# Patient Record
Sex: Male | Born: 1990
Health system: Southern US, Community
[De-identification: ages and names within clinical notes are randomized; demographics above are authoritative.]

## PROBLEM LIST (undated history)

## (undated) DIAGNOSIS — A3790 Whooping cough, unspecified species without pneumonia: Secondary | ICD-10-CM

## (undated) DIAGNOSIS — K259 Gastric ulcer, unspecified as acute or chronic, without hemorrhage or perforation: Secondary | ICD-10-CM

## (undated) DIAGNOSIS — F909 Attention-deficit hyperactivity disorder, unspecified type: Secondary | ICD-10-CM

---
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
## 2009-07-25 ENCOUNTER — Emergency Department (HOSPITAL_COMMUNITY): Admission: EM | Admit: 2009-07-25 | Discharge: 2009-07-25 | Payer: Self-pay | Admitting: Family Medicine

## 2009-08-08 ENCOUNTER — Emergency Department (HOSPITAL_COMMUNITY): Admission: EM | Admit: 2009-08-08 | Discharge: 2009-08-08 | Payer: Self-pay | Admitting: Emergency Medicine

## 2010-04-14 LAB — BASIC METABOLIC PANEL
CO2: 27 mEq/L (ref 19–32)
Chloride: 108 mEq/L (ref 96–112)
Creatinine, Ser: 1.04 mg/dL (ref 0.4–1.5)
GFR calc Af Amer: >60 mL/min (ref >60)
GFR calc non Af Amer: >60 mL/min (ref >60)
Sodium: 143 mEq/L (ref 135–145)

## 2010-04-14 LAB — DIFFERENTIAL
Basophils Absolute: 0.1 10*3/uL (ref 0.0–0.1)
Basophils Relative: 1 % (ref 0–1)
Eosinophils Absolute: 0 10*3/uL (ref 0.0–0.7)
Eosinophils Relative: 0 % (ref 0–5)
Lymphocytes Relative: 9 % — ABNORMAL LOW (ref 12–46)
Lymphs Abs: 1.5 10*3/uL (ref 0.7–4.0)

## 2010-04-14 LAB — CBC
HCT: 44.4 % (ref 39.0–52.0)
MCV: 91 fL (ref 78.0–100.0)
RBC: 4.88 MIL/uL (ref 4.22–5.81)
RDW: 13.5 % (ref 11.5–15.5)

## 2010-04-14 LAB — POCT RAPID STREP A (OFFICE): Streptococcus, Group A Screen (Direct): NEGATIVE

## 2010-04-14 LAB — POCT INFECTIOUS MONO SCREEN: Mono Screen: NEGATIVE

## 2010-04-14 LAB — BRAIN NATRIURETIC PEPTIDE: Pro B Natriuretic peptide (BNP): 43 pg/mL (ref 0.0–100.0)

## 2010-06-11 NOTE — Consult Note (Signed)
[name], [name]              ACCOUNT NO.:  [id number]   MEDICAL RECORD NO.:  [id number]          PATIENT TYPE:  EMS   LOCATION:  MAJO                         FACILITY:  [facility]   PHYSICIAN:  Kinnie Scales. Annalee Genta, M.D.DATE OF BIRTH:  February 23, 1990   DATE OF CONSULTATION:  11/13/2006  DATE OF DISCHARGE:  11/14/2006                                 CONSULTATION   DIAGNOSIS:  Mandibular trauma with mandibular dislocation.   BRIEF HISTORY AND PHYSICAL:  The patient is a 20 year old white male who  was transferred to [facility] Emergency Department after  suffering injury while playing football.  The patient was struck in the  face and suffered a mandibular dislocation with significant right TMJ  trauma.  He had trismus with an open bite deformity and was unable to  close his jaw.  He was evaluated at the football game and his medical  trainer was able to reduce his mandibular dislocation.  He presents to  the [facility] Emergency Department for additional evaluation and  workup.  Imaging studies including Panorex and CT scan were performed.  There was no evidence of mandibular or maxillary fracture.  The temporal  mandibular fossa appeared to be intact and the patient had appropriate  TMJ height without evidence of dislocation.  The patient complained of  significant trismus and malocclusion and was evaluated in the emergency  department.  No loss of consciousness, no bleeding.  No prior history of  mandible trauma.  He complained of right temporal headache and right-  sided otalgia.   PHYSICAL EXAMINATION:  [name] is a healthy 20 year old white male, alert  and oriented to self, place, and time in no acute distress.  HEENT:  Ears show normal external auditory canals.  Tympanic membranes:  No evidence of ear canal laceration or hematoma.  Normal tympanic  membrane.  Nasal cavity shows mildly deviated septum.  No bleeding or  discharge.  Oral cavity/oropharynx:  Normal dentition.   The patient had  normal occlusion.  No evidence of TMJ crepitance.  Minimal pain.  No  lateral excursion or evidence of TMJ dislocation.  NECK:  No lymphadenopathy or mass.   IMPRESSION:  Mandibular trauma with right mandibular dislocation,  resolved.   ASSESSMENT/PLAN:  The patient presents for evaluation of mandibular  trauma and dislocation.  The TMJ is reduced.  There is no evidence of  significant dislocation at this time.  I have recommended a soft diet  avoiding any further trauma.  The patient will start Motrin 600 mg p.o.  t.i.d. for 10 days, use a warm compress over the right jaw joint, and  will return to see me in two weeks for recheck or sooner as warranted.  The patient's mother was comfortable with this plan and will follow up  as above.           ______________________________  Kinnie Scales. Annalee Genta, M.D.     DLS/MEDQ  D:  16/10/9602  T:  11/15/2006  Job:  [id number]
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
## 2014-05-11 ENCOUNTER — Ambulatory Visit (INDEPENDENT_AMBULATORY_CARE_PROVIDER_SITE_OTHER): Payer: Worker's Compensation | Admitting: Family Medicine

## 2014-05-11 VITALS — BP 122/74 | HR 71 | Temp 98.1°F | Resp 18

## 2014-05-11 DIAGNOSIS — S0511XA Contusion of eyeball and orbital tissues, right eye, initial encounter: Secondary | ICD-10-CM

## 2014-05-11 DIAGNOSIS — H53131 Sudden visual loss, right eye: Secondary | ICD-10-CM

## 2014-05-11 DIAGNOSIS — S0501XA Injury of conjunctiva and corneal abrasion without foreign body, right eye, initial encounter: Secondary | ICD-10-CM

## 2014-05-11 DIAGNOSIS — S058X1A Other injuries of right eye and orbit, initial encounter: Secondary | ICD-10-CM | POA: Diagnosis not present

## 2014-05-11 DIAGNOSIS — S0591XA Unspecified injury of right eye and orbit, initial encounter: Secondary | ICD-10-CM

## 2014-05-11 NOTE — Progress Notes (Signed)
Patient had moved his head down and stuck his right eye onto a metal rod that had a hip about 0.5 x 1.5 cm in size. This just happened. He cannot see through that. Acutely he was examined. There is a little tear of the sclera and contusion of the upper part of the iris and conjunctiva from about the line connecting 1:00 and 11:00. His vision can only see vague distinction between 2 and 3 fingers in the right eye. Left eye vision is excellent. He is having a great deal of pain. Spoke to Dr. Alden HippGrote who said to send him immediately over. We did so. I did not feel like it was wise to take time to do the [employer] drug screen. I explained this to his boss. This was to urgent an emergency.

## 2014-05-11 NOTE — Patient Instructions (Signed)
-   Please report to Dr. Dione BoozeGroat immediately for emergency eye evaluation.

## 2014-05-11 NOTE — Progress Notes (Signed)
    MRN: [id number] DOB: 10-27-1990  Subjective:   [name] is a 24 y.o. male presenting for chief complaint of Foreign Body in Eye  Reports blunt trauma to his right eye while at work today. Patient was bending over a stand, a clamp came loose and shot into his right eye. Patient felt immediate pain, has been tearing and unable to open his eye. Has not tried any medications for relief. Denies drug use. Denies any other aggravating or relieving factors, no other questions or concerns.  [name] currently has no medications in their medication list. He has No Known Allergies.  [name]  has no past medical history on file. Also  has no past surgical history on file.  ROS As in subjective.  Objective:   Vitals: BP 122/74 mmHg  Pulse 71  Temp(Src) 98.1 F (36.7 C) (Oral)  Resp 18  SpO2 97%  Physical Exam  Constitutional: He is oriented to person, place, and time and well-developed, well-nourished, and in no distress.  Eyes: Pupils are equal, round, and reactive to light. Right conjunctiva is injected. Right conjunctiva has no hemorrhage. Right eye exhibits abnormal extraocular motion (decreased eye movements superiorly, inferiorly and laterally). Right eye exhibits no nystagmus.    Patient only able to see 2 fingers at ~3 feet.  Neurological: He is alert and oriented to person, place, and time.  Skin: Skin is warm and dry. There is erythema (slight eyelid erythema upper>lower in right eye).   Assessment and Plan :   1. Eye injury, right, initial encounter 2. Corneal abrasion, right, initial encounter 3. Contusion of right eye, initial encounter 4. Acute loss of vision, right - Spoke with Dr. Dione BoozeGroat regarding patient's case, advised that he report to clinic immediately for further evaluation of retinal detachment, penetrating globe injury. - Patient was sent immediately, provided with ibuprofen and proparacaine eye drops for pain relief.  Wallis BambergMario Kortne All, PA-C [facility] [phone] 05/11/2014 11:01 AM

## 2014-05-15 ENCOUNTER — Emergency Department (HOSPITAL_COMMUNITY)
Admission: EM | Admit: 2014-05-15 | Discharge: 2014-05-16 | Disposition: A | Discharge status: Home / Self Care | Payer: Worker's Compensation | Attending: Emergency Medicine | Admitting: Emergency Medicine

## 2014-05-15 ENCOUNTER — Encounter (HOSPITAL_COMMUNITY): Payer: Self-pay | Admitting: Emergency Medicine

## 2014-05-15 DIAGNOSIS — Z720 Tobacco use: Secondary | ICD-10-CM | POA: Insufficient documentation

## 2014-05-15 DIAGNOSIS — R51 Headache: Secondary | ICD-10-CM | POA: Diagnosis not present

## 2014-05-15 DIAGNOSIS — S0591XD Unspecified injury of right eye and orbit, subsequent encounter: Secondary | ICD-10-CM | POA: Insufficient documentation

## 2014-05-15 DIAGNOSIS — W228XXD Striking against or struck by other objects, subsequent encounter: Secondary | ICD-10-CM | POA: Diagnosis not present

## 2014-05-15 DIAGNOSIS — R110 Nausea: Secondary | ICD-10-CM | POA: Insufficient documentation

## 2014-05-15 DIAGNOSIS — H538 Other visual disturbances: Secondary | ICD-10-CM | POA: Insufficient documentation

## 2014-05-15 DIAGNOSIS — R519 Headache, unspecified: Secondary | ICD-10-CM

## 2014-05-15 LAB — BASIC METABOLIC PANEL
Anion gap: 11 (ref 5–15)
BUN: 14 mg/dL (ref 6–23)
CO2: 23 mmol/L (ref 19–32)
Calcium: 9.6 mg/dL (ref 8.4–10.5)
Chloride: 104 mmol/L (ref 96–112)
Creatinine, Ser: 0.84 mg/dL (ref 0.50–1.35)
GFR calc Af Amer: >90 mL/min (ref >90)
GFR calc non Af Amer: >90 mL/min (ref >90)
Glucose, Bld: 86 mg/dL (ref 70–99)
Potassium: 3.9 mmol/L (ref 3.5–5.1)
Sodium: 138 mmol/L (ref 135–145)

## 2014-05-15 LAB — CBC
HCT: 46.7 % (ref 39.0–52.0)
Hemoglobin: 16.2 g/dL (ref 13.0–17.0)
MCH: 31.3 pg (ref 26.0–34.0)
MCHC: 34.7 g/dL (ref 30.0–36.0)
MCV: 90.3 fL (ref 78.0–100.0)
Platelets: 242 10*3/uL (ref 150–400)
RBC: 5.17 MIL/uL (ref 4.22–5.81)
RDW: 13.3 % (ref 11.5–15.5)
WBC: 12.1 10*3/uL — ABNORMAL HIGH (ref 4.0–10.5)

## 2014-05-15 MED ORDER — FLUORESCEIN SODIUM 1 MG OP STRP
1.0000 | ORAL_STRIP | Freq: Once | OPHTHALMIC | Status: DC
  Filled 2014-05-15: qty 1

## 2014-05-15 MED ORDER — TETRACAINE HCL 0.5 % OP SOLN
2.0000 [drp] | Freq: Once | OPHTHALMIC | Status: DC
  Filled 2014-05-15: qty 2

## 2014-05-15 MED ORDER — ONDANSETRON HCL 4 MG/2ML IJ SOLN
4.0000 mg | Freq: Once | INTRAMUSCULAR | Status: AC
  Administered 2014-05-15: 4 mg via INTRAVENOUS
  Filled 2014-05-15: qty 2

## 2014-05-15 MED ORDER — MORPHINE SULFATE 4 MG/ML IJ SOLN
4.0000 mg | Freq: Once | INTRAMUSCULAR | Status: AC
  Administered 2014-05-15: 4 mg via INTRAVENOUS
  Filled 2014-05-15: qty 1

## 2014-05-15 NOTE — ED Provider Notes (Signed)
CSN: [id number]     Arrival date & time 05/15/14  2050 History  This chart is scribed for non-physician practitioner, Junius FinnerErin O'Malley, PA-C, working with Arby BarretteMarcy Pfeiffer, MD by Abel PrestoKara Demonbreun, ED Scribe.  This patient was seen in room TR04C/TR04C and the patient's care was started 10:10 PM.     Chief Complaint  Patient presents with  . Eye Injury    Patient is a 24 y.o. male presenting with eye injury. The history is provided by the patient. No language interpreter was used.  Eye Injury Associated symptoms include headaches.   HPI Comments: Webb SilversmithJoshua Allocca is a 24 y.o. male who presents to the Emergency Department complaining of right eye injury with onset 11 days ago. Pt hit head on metal clamp and rod lacerated his eye. Pt states eye was scratched and damaged done to skull, but notes he was treated for the injury at that time. Pt notes 8-9/10 pain with a frontal headache and blurred vision. However, pt's opthamologist, Dr. Dione BoozeGroat has concern for optic nerve damage, infection, or fracture since pt's vision has not been recovering as it should. Pt was to have a CT scan done through worker's comp but pt reports they would not approve the scan. Pt here for evaluation.  He reports waxing and waning headache with onset yesterday and nausea since incident. Pt has NKDA. Pt denies of fever and vomiting. Pt does not have a PCP.   History reviewed. No pertinent past medical history. History reviewed. No pertinent past surgical history. No family history on file. History  Substance Use Topics  . Smoking status: Current Every Day Smoker  . Smokeless tobacco: Not on file  . Alcohol Use: No    Review of Systems  Constitutional: Negative for fever.  Eyes: Positive for pain, redness and visual disturbance. Negative for discharge and itching.  Gastrointestinal: Positive for nausea. Negative for vomiting.  Neurological: Positive for headaches.  All other systems reviewed and are negative.     Allergies   Review of patient's allergies indicates no known allergies.  Home Medications   Prior to Admission medications   Medication Sig Start Date End Date Taking? Authorizing Provider  HYDROcodone-acetaminophen (NORCO/VICODIN) 5-325 MG per tablet Take 1-2 tablets by mouth every 4 (four) hours as needed. 05/16/14   Junius FinnerErin O'Malley, PA-C   BP 121/68 mmHg  Pulse 70  Temp(Src) 98 F (36.7 C) (Oral)  Resp 18  Ht 6\' 3"  (1.905 m)  Wt 275 lb (124.739 kg)  BMI 34.37 kg/m2  SpO2 97% Physical Exam  Constitutional: He is oriented to person, place, and time. He appears well-developed and well-nourished.  HENT:  Head: Normocephalic.  No obvious deformity of right orbital socket; tenderness to superior medial aspect of right orbit; inferior lateral aspect of right orbit  Eyes: EOM are normal. Pupils are equal, round, and reactive to light. Foreign body (medicinal/protective lens in place) present in the right eye. Right conjunctiva has a hemorrhage.    Neck: Normal range of motion.  Cardiovascular: Normal rate.   Pulmonary/Chest: Effort normal.  Musculoskeletal: Normal range of motion.  Neurological: He is alert and oriented to person, place, and time.  Skin: Skin is warm and dry.  Psychiatric: He has a normal mood and affect. His behavior is normal.  Nursing note and vitals reviewed.   ED Course  Procedures (including critical care time) DIAGNOSTIC STUDIES: Oxygen Saturation is 97% on room air, normal by my interpretation.    COORDINATION OF CARE: 10:16 PM Discussed treatment plan  with patient at beside, the patient agrees with the plan and has no further questions at this time.  Consulted directly with Dr. Dione Booze who states he is concerned pt may have a fracture, which could lead pt to be susceptible to infection due to nature of pt's injury. Pt was seen initially by Dr. Dione Booze immediately after incident. Dr. Dione Booze reports the external eye is healing well and even has a lens in place to aid in  healing, however, requests a CT scan.    1:05 AM  Pt states he received a phone text by Dr. Dione Booze, ophthalmology, who personally viewed pt's CT scan.  Dr. Dione Booze states no fracture visible on CT and optic nerve appears to be in tact. Pt may be discharged to f/u with Dr. Dione Booze next week.  Pt states he feels comfortable with this result and feels comfortable being discharged home.    Labs Review Labs Reviewed  CBC - Abnormal; Notable for the following:    WBC 12.1 (*)    All other components within normal limits  BASIC METABOLIC PANEL    Imaging Review No results found.   EKG Interpretation None      MDM   Final diagnoses:  Right eye injury, subsequent encounter  Frontal headache   Pt is a 23yo male sent to ED for an urgent head CT per request of Dr. Dione Booze, ophthalmologist.  Pt was seen initially by Dr. Dione Booze for work's comp related injury.  CT did not officially result while pt in ED.  Pt did receive at text message from Dr. Dione Booze and insisted he felt confident with the report given by his ophthalmologist and requested to be discharged.  Pt safe for discharge home. Pt to f/u with Dr. Dione Booze next week as planned   I personally performed the services described in this documentation, which was scribed in my presence. The recorded information has been reviewed and is accurate.     Junius Finner, PA-C 05/16/14 0148  Loren Racer, MD 05/16/14 [phone]

## 2014-05-15 NOTE — ED Notes (Signed)
Pt. reports injury to right eye last Thursday ( hit with a metal rod ) with pain and blurred vision , advised by his eye MD to go to ER for further evaluation .

## 2014-05-16 ENCOUNTER — Emergency Department (HOSPITAL_COMMUNITY): Payer: Worker's Compensation

## 2014-05-16 ENCOUNTER — Encounter (HOSPITAL_COMMUNITY): Payer: Self-pay

## 2014-05-16 MED ORDER — IOHEXOL 300 MG/ML  SOLN
75.0000 mL | Freq: Once | INTRAMUSCULAR | Status: AC | PRN
  Administered 2014-05-16: 75 mL via INTRAVENOUS

## 2014-05-16 MED ORDER — HYDROCODONE-ACETAMINOPHEN 5-325 MG PO TABS: 1.0000 | ORAL_TABLET | ORAL | Status: DC | PRN

## 2014-05-16 NOTE — Discharge Instructions (Signed)
Blunt Trauma °You have been evaluated for injuries. You have been examined and your caregiver has not found injuries serious enough to require hospitalization. °It is common to have multiple bruises and sore muscles following an accident. These tend to feel worse for the first 24 hours. You will feel more stiffness and soreness over the next several hours and worse when you wake up the first morning after your accident. After this point, you should begin to improve with each passing day. The amount of improvement depends on the amount of damage done in the accident. °Following your accident, if some part of your body does not work as it should, or if the pain in any area continues to increase, you should return to the Emergency Department for re-evaluation.  °HOME CARE INSTRUCTIONS  °Routine care for sore areas should include: °· Ice to sore areas every 2 hours for 20 minutes while awake for the next 2 days. °· Drink extra fluids (not alcohol). °· Take a hot or warm shower or bath once or twice a day to increase blood flow to sore muscles. This will help you "limber up". °· Activity as tolerated. Lifting may aggravate neck or back pain. °· Only take over-the-counter or prescription medicines for pain, discomfort, or fever as directed by your caregiver. Do not use aspirin. This may increase bruising or increase bleeding if there are small areas where this is happening. °SEEK IMMEDIATE MEDICAL CARE IF: °· Numbness, tingling, weakness, or problem with the use of your arms or legs. °· A severe headache is not relieved with medications. °· There is a change in bowel or bladder control. °· Increasing pain in any areas of the body. °· Short of breath or dizzy. °· Nauseated, vomiting, or sweating. °· Increasing belly (abdominal) discomfort. °· Blood in urine, stool, or vomiting blood. °· Pain in either shoulder in an area where a shoulder strap would be. °· Feelings of lightheadedness or if you have a fainting  episode. °Sometimes it is not possible to identify all injuries immediately after the trauma. It is important that you continue to monitor your condition after the emergency department visit. If you feel you are not improving, or improving more slowly than should be expected, call your physician. If you feel your symptoms (problems) are worsening, return to the Emergency Department immediately. °Document Released: 10/09/2000 Document Revised: 04/07/2011 Document Reviewed: 09/01/2007 °Exit[employer]® Patient Information ©2015 [employer]. This information is not intended to replace advice given to you by your health care provider. Make sure you discuss any questions you have with your health care provider. ° °

## 2015-08-08 ENCOUNTER — Encounter (HOSPITAL_COMMUNITY): Payer: Self-pay | Admitting: Emergency Medicine

## 2015-08-08 ENCOUNTER — Ambulatory Visit (HOSPITAL_COMMUNITY)
Admission: EM | Admit: 2015-08-08 | Discharge: 2015-08-08 | Disposition: A | Discharge status: Home / Self Care | Payer: 59 | Attending: Family Medicine | Admitting: Family Medicine

## 2015-08-08 DIAGNOSIS — S39012A Strain of muscle, fascia and tendon of lower back, initial encounter: Secondary | ICD-10-CM | POA: Diagnosis not present

## 2015-08-08 MED ORDER — CYCLOBENZAPRINE HCL 5 MG PO TABS: 5.0000 mg | ORAL_TABLET | Freq: Three times a day (TID) | ORAL | Status: DC

## 2015-08-08 MED ORDER — DICLOFENAC POTASSIUM 50 MG PO TABS: 50.0000 mg | ORAL_TABLET | Freq: Three times a day (TID) | ORAL | Status: DC

## 2015-08-08 NOTE — Discharge Instructions (Signed)
See orthopedist if further problems °

## 2015-08-08 NOTE — ED Provider Notes (Signed)
CSN: [id number]     Arrival date & time 08/08/15  1233 History   First MD Initiated Contact with Patient 08/08/15 1305     Chief Complaint  Patient presents with  . Back Pain   (Consider location/radiation/quality/duration/timing/severity/associated sxs/prior Treatment) Patient is a 25 y.o. male presenting with back pain. The history is provided by the patient.  Back Pain Location:  Lumbar spine Quality:  Stabbing Radiates to:  R thigh Pain severity:  Mild Onset quality:  Gradual Duration:  2 weeks Progression:  Unchanged Chronicity:  Chronic Context: lifting heavy objects   Relieved by:  None tried Worsened by:  Nothing tried Ineffective treatments:  None tried Associated symptoms: leg pain   Associated symptoms: no abdominal pain, no bladder incontinence, no bowel incontinence, no fever, no numbness, no paresthesias and no pelvic pain     History reviewed. No pertinent past medical history. History reviewed. No pertinent past surgical history. Family History  Problem Relation Age of Onset  . Hypertension Mother    Social History  Substance Use Topics  . Smoking status: Current Every Day Smoker  . Smokeless tobacco: None  . Alcohol Use: No    Review of Systems  Constitutional: Negative.  Negative for fever.  Gastrointestinal: Negative.  Negative for abdominal pain and bowel incontinence.  Genitourinary: Negative for bladder incontinence and pelvic pain.  Musculoskeletal: Positive for back pain. Negative for joint swelling and gait problem.  Skin: Negative.   Neurological: Negative for numbness and paresthesias.  All other systems reviewed and are negative.   Allergies  Review of patient's allergies indicates no known allergies.  Home Medications   Prior to Admission medications   Medication Sig Start Date End Date Taking? Authorizing Provider  acetaminophen (TYLENOL) 325 MG tablet Take 650 mg by mouth every 6 (six) hours as needed.   Yes Historical Provider,  MD  cyclobenzaprine (FLEXERIL) 5 MG tablet Take 1 tablet (5 mg total) by mouth 3 (three) times daily. For muscle relaxer. 08/08/15   Linna HoffJames D Maebelle Sulton, MD  diclofenac (CATAFLAM) 50 MG tablet Take 1 tablet (50 mg total) by mouth 3 (three) times daily. 08/08/15   Linna HoffJames D Alekzander Cardell, MD  HYDROcodone-acetaminophen (NORCO/VICODIN) 5-325 MG per tablet Take 1-2 tablets by mouth every 4 (four) hours as needed. 05/16/14   Junius FinnerErin O'Malley, PA-C   Meds Ordered and Administered this Visit  Medications - No data to display  BP 121/60 mmHg  Pulse 58  Temp(Src) 98.8 F (37.1 C) (Oral)  Resp 18  SpO2 98% No data found.   Physical Exam  Constitutional: He is oriented to person, place, and time. He appears well-developed and well-nourished. No distress.  Abdominal: Soft. Bowel sounds are normal.  Musculoskeletal: He exhibits tenderness.       Lumbar back: He exhibits decreased range of motion, tenderness, pain and spasm. He exhibits no swelling and normal pulse.       Back:  Neurological: He is alert and oriented to person, place, and time.  Skin: Skin is warm and dry.  Nursing note and vitals reviewed.   ED Course  Procedures (including critical care time)  Labs Review Labs Reviewed - No data to display  Imaging Review No results found.   Visual Acuity Review  Right Eye Distance:   Left Eye Distance:   Bilateral Distance:    Right Eye Near:   Left Eye Near:    Bilateral Near:         MDM   1. Low back strain,  initial encounter        Linna Hoff, MD 08/08/15 1339

## 2015-08-08 NOTE — ED Notes (Signed)
Lower back pain that has been occuring intermittently for 4 years.   Patient reports pain into right leg.

## 2016-03-27 DIAGNOSIS — J019 Acute sinusitis, unspecified: Secondary | ICD-10-CM | POA: Diagnosis not present

## 2016-09-30 DIAGNOSIS — R42 Dizziness and giddiness: Secondary | ICD-10-CM | POA: Diagnosis not present

## 2016-09-30 DIAGNOSIS — I10 Essential (primary) hypertension: Secondary | ICD-10-CM | POA: Diagnosis not present

## 2016-10-13 ENCOUNTER — Encounter (HOSPITAL_COMMUNITY): Payer: Self-pay

## 2016-10-20 DIAGNOSIS — D72829 Elevated white blood cell count, unspecified: Secondary | ICD-10-CM | POA: Diagnosis not present

## 2016-10-20 DIAGNOSIS — R945 Abnormal results of liver function studies: Secondary | ICD-10-CM | POA: Diagnosis not present

## 2016-11-14 DIAGNOSIS — M25561 Pain in right knee: Secondary | ICD-10-CM | POA: Diagnosis not present

## 2016-11-14 DIAGNOSIS — Z23 Encounter for immunization: Secondary | ICD-10-CM | POA: Diagnosis not present

## 2017-05-01 DIAGNOSIS — R030 Elevated blood-pressure reading, without diagnosis of hypertension: Secondary | ICD-10-CM | POA: Diagnosis not present

## 2017-05-01 DIAGNOSIS — K644 Residual hemorrhoidal skin tags: Secondary | ICD-10-CM | POA: Diagnosis not present

## 2017-05-01 DIAGNOSIS — R21 Rash and other nonspecific skin eruption: Secondary | ICD-10-CM | POA: Diagnosis not present

## 2017-09-13 DIAGNOSIS — J209 Acute bronchitis, unspecified: Secondary | ICD-10-CM | POA: Diagnosis not present

## 2017-09-13 DIAGNOSIS — J019 Acute sinusitis, unspecified: Secondary | ICD-10-CM | POA: Diagnosis not present

## 2017-09-18 ENCOUNTER — Other Ambulatory Visit: Payer: Self-pay | Admitting: Family Medicine

## 2017-09-18 ENCOUNTER — Ambulatory Visit
Admission: RE | Admit: 2017-09-18 | Discharge: 2017-09-18 | Disposition: A | Discharge status: Home / Self Care | Payer: BLUE CROSS/BLUE SHIELD | Source: Ambulatory Visit | Attending: Family Medicine | Admitting: Family Medicine

## 2017-09-18 DIAGNOSIS — R05 Cough: Secondary | ICD-10-CM | POA: Diagnosis not present

## 2017-09-18 DIAGNOSIS — J209 Acute bronchitis, unspecified: Secondary | ICD-10-CM | POA: Diagnosis not present

## 2017-09-18 DIAGNOSIS — J40 Bronchitis, not specified as acute or chronic: Secondary | ICD-10-CM

## 2017-09-18 DIAGNOSIS — R21 Rash and other nonspecific skin eruption: Secondary | ICD-10-CM | POA: Diagnosis not present

## 2017-10-01 DIAGNOSIS — R05 Cough: Secondary | ICD-10-CM | POA: Diagnosis not present

## 2017-10-16 DIAGNOSIS — A3790 Whooping cough, unspecified species without pneumonia: Secondary | ICD-10-CM | POA: Diagnosis not present

## 2017-10-25 ENCOUNTER — Emergency Department (HOSPITAL_COMMUNITY): Payer: BLUE CROSS/BLUE SHIELD

## 2017-10-25 ENCOUNTER — Encounter (HOSPITAL_COMMUNITY): Payer: Self-pay

## 2017-10-25 ENCOUNTER — Other Ambulatory Visit: Payer: Self-pay

## 2017-10-25 ENCOUNTER — Emergency Department (HOSPITAL_COMMUNITY)
Admission: EM | Admit: 2017-10-25 | Discharge: 2017-10-25 | Disposition: A | Discharge status: Home / Self Care | Payer: BLUE CROSS/BLUE SHIELD | Attending: Emergency Medicine | Admitting: Emergency Medicine

## 2017-10-25 DIAGNOSIS — R05 Cough: Secondary | ICD-10-CM | POA: Insufficient documentation

## 2017-10-25 DIAGNOSIS — Z87891 Personal history of nicotine dependence: Secondary | ICD-10-CM | POA: Diagnosis not present

## 2017-10-25 DIAGNOSIS — R1033 Periumbilical pain: Secondary | ICD-10-CM | POA: Diagnosis not present

## 2017-10-25 DIAGNOSIS — R109 Unspecified abdominal pain: Secondary | ICD-10-CM | POA: Diagnosis not present

## 2017-10-25 HISTORY — DX: Whooping cough, unspecified species without pneumonia: A37.90

## 2017-10-25 LAB — COMPREHENSIVE METABOLIC PANEL
ALBUMIN: 4.9 g/dL (ref 3.5–5.0)
ALT: 46 U/L — ABNORMAL HIGH (ref 0–44)
ANION GAP: 12 (ref 5–15)
AST: 26 U/L (ref 15–41)
Alkaline Phosphatase: 72 U/L (ref 38–126)
BUN: 13 mg/dL (ref 6–20)
CHLORIDE: 108 mmol/L (ref 98–111)
CO2: 23 mmol/L (ref 22–32)
Calcium: 10 mg/dL (ref 8.9–10.3)
Creatinine, Ser: 0.91 mg/dL (ref 0.61–1.24)
GFR calc Af Amer: >60 mL/min (ref >60)
GFR calc non Af Amer: >60 mL/min (ref >60)
GLUCOSE: 92 mg/dL (ref 70–99)
POTASSIUM: 4 mmol/L (ref 3.5–5.1)
SODIUM: 143 mmol/L (ref 135–145)
Total Bilirubin: 0.8 mg/dL (ref 0.3–1.2)
Total Protein: 8.3 g/dL — ABNORMAL HIGH (ref 6.5–8.1)

## 2017-10-25 LAB — CBC
HEMATOCRIT: 48 % (ref 39.0–52.0)
HEMOGLOBIN: 16.6 g/dL (ref 13.0–17.0)
MCH: 31.8 pg (ref 26.0–34.0)
MCHC: 34.6 g/dL (ref 30.0–36.0)
MCV: 92 fL (ref 78.0–100.0)
Platelets: 255 10*3/uL (ref 150–400)
RBC: 5.22 MIL/uL (ref 4.22–5.81)
RDW: 13 % (ref 11.5–15.5)
WBC: 11.4 10*3/uL — ABNORMAL HIGH (ref 4.0–10.5)

## 2017-10-25 LAB — URINALYSIS, ROUTINE W REFLEX MICROSCOPIC
Bilirubin Urine: NEGATIVE
GLUCOSE, UA: NEGATIVE mg/dL
HGB URINE DIPSTICK: NEGATIVE
Ketones, ur: NEGATIVE mg/dL
Leukocytes, UA: NEGATIVE
NITRITE: NEGATIVE
PH: 7 (ref 5.0–8.0)
Protein, ur: NEGATIVE mg/dL
SPECIFIC GRAVITY, URINE: 1.021 (ref 1.005–1.030)

## 2017-10-25 LAB — LIPASE, BLOOD: Lipase: 30 U/L (ref 11–51)

## 2017-10-25 MED ORDER — IOPAMIDOL (ISOVUE-300) INJECTION 61%
INTRAVENOUS | Status: AC
  Filled 2017-10-25: qty 100

## 2017-10-25 MED ORDER — HYDROCODONE-ACETAMINOPHEN 5-325 MG PO TABS
1.0000 | ORAL_TABLET | Freq: Once | ORAL | Status: AC
  Administered 2017-10-25: 1 via ORAL
  Filled 2017-10-25: qty 1

## 2017-10-25 MED ORDER — HYDROMORPHONE HCL 1 MG/ML IJ SOLN
0.5000 mg | Freq: Once | INTRAMUSCULAR | Status: AC
  Administered 2017-10-25: 0.5 mg via INTRAVENOUS
  Filled 2017-10-25: qty 1

## 2017-10-25 MED ORDER — ONDANSETRON HCL 4 MG/2ML IJ SOLN
4.0000 mg | Freq: Once | INTRAMUSCULAR | Status: AC
  Administered 2017-10-25: 4 mg via INTRAVENOUS
  Filled 2017-10-25: qty 2

## 2017-10-25 MED ORDER — NAPROXEN 500 MG PO TABS: 500.0000 mg | ORAL_TABLET | Freq: Two times a day (BID) | ORAL | 0 refills | Status: DC

## 2017-10-25 MED ORDER — HYDROCODONE-ACETAMINOPHEN 5-325 MG PO TABS: 1.0000 | ORAL_TABLET | Freq: Four times a day (QID) | ORAL | 0 refills | Status: DC | PRN

## 2017-10-25 MED ORDER — KETOROLAC TROMETHAMINE 15 MG/ML IJ SOLN
15.0000 mg | Freq: Once | INTRAMUSCULAR | Status: AC
  Administered 2017-10-25: 15 mg via INTRAVENOUS
  Filled 2017-10-25: qty 1

## 2017-10-25 MED ORDER — IOPAMIDOL (ISOVUE-300) INJECTION 61%
100.0000 mL | Freq: Once | INTRAVENOUS | Status: AC | PRN
  Administered 2017-10-25: 100 mL via INTRAVENOUS

## 2017-10-25 MED ORDER — SODIUM CHLORIDE 0.9 % IV BOLUS
500.0000 mL | Freq: Once | INTRAVENOUS | Status: AC
  Administered 2017-10-25: 500 mL via INTRAVENOUS

## 2017-10-25 NOTE — ED Provider Notes (Signed)
[facility]-EMERGENCY DEPT Provider Note   CSN: [id number] Arrival date & time: 10/25/17  1352     History   Chief Complaint Chief Complaint  Patient presents with  . Abdominal Pain    HPI [name] is a 27 y.o. male.  Patient with history of chronic diarrhea, recent diagnosis of whooping cough status post antibiotic treatment presents with acute onset of periumbilical and left-sided abdominal pain starting last night.  Patient states that yesterday he coughed and felt a pop in the left part of his abdomen.  Symptoms were severe at onset but then improved but then worsened acutely today.  Pain does not radiate.  Patient went to an urgent care and was sent to the emergency department for imaging.  Patient has had no associated fevers, nausea, vomiting.  No chest pain or shortness of breath.  His coughing spells are improving but he continues to have intense coughing fits at times.  No urinary symptoms.  No treatments prior to arrival.  Pain is worse with movement and palpation.     Past Medical History:  Diagnosis Date  . Whooping cough     There are no active problems to display for this patient.   History reviewed. No pertinent surgical history.      Home Medications    Prior to Admission medications   Medication Sig Start Date End Date Taking? Authorizing Provider  cyclobenzaprine (FLEXERIL) 5 MG tablet Take 1 tablet (5 mg total) by mouth 3 (three) times daily. For muscle relaxer. Patient not taking: Reported on 10/25/2017 08/08/15   Linna Hoff, MD  diclofenac (CATAFLAM) 50 MG tablet Take 1 tablet (50 mg total) by mouth 3 (three) times daily. Patient not taking: Reported on 10/25/2017 08/08/15   Linna Hoff, MD  HYDROcodone-acetaminophen (NORCO/VICODIN) 5-325 MG per tablet Take 1-2 tablets by mouth every 4 (four) hours as needed. Patient not taking: Reported on 10/25/2017 05/16/14   Rolla Plate    Family History Family History    Problem Relation Age of Onset  . Hypertension Mother     Social History Social History   Tobacco Use  . Smoking status: Former Games developer  . Smokeless tobacco: Never Used  Substance Use Topics  . Alcohol use: No    Alcohol/week: 0.0 standard drinks  . Drug use: No     Allergies   Patient has no known allergies.   Review of Systems Review of Systems  Constitutional: Negative for fever.  HENT: Negative for rhinorrhea and sore throat.   Eyes: Negative for redness.  Respiratory: Positive for cough.   Cardiovascular: Negative for chest pain.  Gastrointestinal: Positive for abdominal pain and diarrhea. Negative for nausea and vomiting.  Genitourinary: Negative for dysuria.  Musculoskeletal: Negative for myalgias.  Skin: Negative for rash.  Neurological: Negative for headaches.     Physical Exam Updated Vital Signs BP (!) 151/83 (BP Location: Left Arm)   Pulse 81   Temp 99.1 F (37.3 C) (Oral)   Ht 6\' 3"  (1.905 m)   Wt (!) 137 kg   SpO2 96%   BMI 37.75 kg/m   Physical Exam  Constitutional: He appears well-developed and well-nourished.  HENT:  Head: Normocephalic and atraumatic.  Eyes: Conjunctivae are normal. Right eye exhibits no discharge. Left eye exhibits no discharge.  Neck: Normal range of motion. Neck supple.  Cardiovascular: Normal rate, regular rhythm and normal heart sounds.  Pulmonary/Chest: Effort normal and breath sounds normal.  Abdominal: Soft. There is  tenderness (Moderate tenderness in areas noted) in the periumbilical area and left upper quadrant. There is guarding. There is no rebound.  No hernias palpated.  Neurological: He is alert.  Skin: Skin is warm and dry.  Psychiatric: He has a normal mood and affect.  Nursing note and vitals reviewed.    ED Treatments / Results  Labs (all labs ordered are listed, but only abnormal results are displayed) Labs Reviewed  COMPREHENSIVE METABOLIC PANEL - Abnormal; Notable for the following components:       Result Value   Total Protein 8.3 (*)    ALT 46 (*)    All other components within normal limits  CBC - Abnormal; Notable for the following components:   WBC 11.4 (*)    All other components within normal limits  URINALYSIS, ROUTINE W REFLEX MICROSCOPIC - Abnormal; Notable for the following components:   APPearance CLOUDY (*)    All other components within normal limits  LIPASE, BLOOD    EKG None  Radiology Ct Abdomen Pelvis W Contrast  Result Date: 10/25/2017 CLINICAL DATA:  Periumbilical abdominal pain. Whooping cough. Left upper quadrant pop after coughing. EXAM: CT ABDOMEN AND PELVIS WITH CONTRAST TECHNIQUE: Multidetector CT imaging of the abdomen and pelvis was performed using the standard protocol following bolus administration of intravenous contrast. CONTRAST:  ISOVUE-300 IOPAMIDOL (ISOVUE-300) INJECTION 61% COMPARISON:  None. FINDINGS: Lower chest: No significant pulmonary nodules or acute consolidative airspace disease. Hepatobiliary: Normal liver size. No liver mass. Normal gallbladder with no radiopaque cholelithiasis. No biliary ductal dilatation. Pancreas: Normal, with no mass or duct dilation. Spleen: Normal size. No mass. Adrenals/Urinary Tract: Normal adrenals. Normal kidneys with no hydronephrosis and no renal mass. Normal bladder. Stomach/Bowel: Normal non-distended stomach. Normal caliber small bowel with no small bowel wall thickening. Normal appendix. Normal large bowel with no diverticulosis, large bowel wall thickening or pericolonic fat stranding. Vascular/Lymphatic: Normal caliber abdominal aorta. No pathologically enlarged lymph nodes in the abdomen or pelvis. Reproductive: Normal size prostate Other: No pneumoperitoneum, ascites or focal fluid collection. No evidence of a ventral abdominal hernia. Musculoskeletal: No aggressive appearing focal osseous lesions. Symmetric mild gynecomastia. IMPRESSION: No acute abnormality. No evidence of bowel obstruction or  acute bowel inflammation. No evidence of ventral abdominal hernia. Electronically Signed   By: Delbert Phenix M.D.   On: 10/25/2017 17:44    Procedures Procedures (including critical care time)  Medications Ordered in ED Medications  sodium chloride 0.9 % bolus 500 mL (has no administration in time range)  HYDROmorphone (DILAUDID) injection 0.5 mg (has no administration in time range)  ondansetron (ZOFRAN) injection 4 mg (has no administration in time range)     Initial Impression / Assessment and Plan / ED Course  I have reviewed the triage vital signs and the nursing notes.  Pertinent labs & imaging results that were available during my care of the patient were reviewed by me and considered in my medical decision making (see chart for details).     Patient seen and examined. Imaging and pain medication ordered.   Vital signs reviewed and are as follows: BP (!) 151/83 (BP Location: Left Arm)   Pulse 81   Temp 99.1 F (37.3 C) (Oral)   Ht 6\' 3"  (1.905 m)   Wt (!) 137 kg   SpO2 96%   BMI 37.75 kg/m   Patient discussed with and seen by Dr. Freida Busman.  CT performed.  No intra-abdominal etiology discovered on exam.  Imaging reviewed by myself.  Patient updated.  We will discharged home on NSAIDs.  Patient given a small amount of Vicodin to use for more severe pain.  Work note given.  Patient counseled on use of narcotic pain medications. Counseled not to combine these medications with others containing tylenol. Urged not to drink alcohol, drive, or perform any other activities that requires focus while taking these medications. The patient verbalizes understanding and agrees with the plan.  The patient was urged to return to the Emergency Department immediately with worsening of current symptoms, worsening abdominal pain, persistent vomiting, blood noted in stools, fever, or any other concerns. The patient verbalized understanding.    Final Clinical Impressions(s) / ED Diagnoses    Final diagnoses:  Periumbilical abdominal pain   Patient with abdominal pain -- suspected MSK pain as pain started after cough and CT is neg . Vitals are stable, no fever. Labs with mild leukocytosis otherwise reassuring. Imaging neg. No signs of dehydration, patient is tolerating PO's. Lungs are clear and no signs suggestive of PNA. Low concern for appendicitis, cholecystitis, pancreatitis, ruptured viscus, UTI, kidney stone, aortic dissection, aortic aneurysm or other emergent abdominal etiology. Supportive therapy indicated with return if symptoms worsen.    ED Discharge Orders         Ordered    HYDROcodone-acetaminophen (NORCO/VICODIN) 5-325 MG tablet  Every 6 hours PRN     10/25/17 1856    naproxen (NAPROSYN) 500 MG tablet  2 times daily     10/25/17 1856           Rodell, Marrs, PA-C 10/25/17 2036    Lorre Nick, MD 10/26/17 [phone]

## 2017-10-25 NOTE — ED Triage Notes (Signed)
Patient reports that he was treated for whooping cough 3 weeks ago. Last night he coughed and felt a pop in the LUQ. Patient went to an UC today and was told to come to the ED to rule out a ruptured hernia. Patient c/o sharp pain LUQ.  UC noted crepitus

## 2017-10-25 NOTE — ED Provider Notes (Signed)
Medical screening examination/treatment/procedure(s) were conducted as a shared visit with non-physician practitioner(s) and myself.  I personally evaluated the patient during the encounter.  None 27 year old male presents with acute onset of left lower quadrant and left lower quadrant abdominal pain that got worse after the cough.  On exam he is tender at his left upper and lower quadrants.  Went to urgent care and concern for possible bowel involvement and sent for CAT scan.  Results are pending   Lorre Nick, MD 10/25/17 1722

## 2017-10-25 NOTE — Discharge Instructions (Signed)
Please read and follow all provided instructions.  Your diagnoses today include:  1. Periumbilical abdominal pain     Tests performed today include:  Blood counts and electrolytes  Blood tests to check liver and kidney function  Blood tests to check pancreas function  Urine test to look for infection  CT scan - does not show any serious problems  Vital signs. See below for your results today.   Medications prescribed:   Vicodin (hydrocodone/acetaminophen) - narcotic pain medication  DO NOT drive or perform any activities that require you to be awake and alert because this medicine can make you drowsy. BE VERY CAREFUL not to take multiple medicines containing Tylenol (also called acetaminophen). Doing so can lead to an overdose which can damage your liver and cause liver failure and possibly death.   Naproxen - anti-inflammatory pain medication  Do not exceed 500mg  naproxen every 12 hours, take with food  You have been prescribed an anti-inflammatory medication or NSAID. Take with food. Take smallest effective dose for the shortest duration needed for your pain. Stop taking if you experience stomach pain or vomiting.   Take any prescribed medications only as directed.  Home care instructions:   Follow any educational materials contained in this packet.  Follow-up instructions: Please follow-up with your primary care provider in the next 3 days for further evaluation of your symptoms.    Return instructions:  SEEK IMMEDIATE MEDICAL ATTENTION IF:  The pain does not go away or becomes severe   A temperature above 101F develops   Repeated vomiting occurs (multiple episodes)   The pain becomes localized to portions of the abdomen. The right side could possibly be appendicitis. In an adult, the left lower portion of the abdomen could be colitis or diverticulitis.   Blood is being passed in stools or vomit (bright red or black tarry stools)   You develop chest pain,  difficulty breathing, dizziness or fainting, or become confused, poorly responsive, or inconsolable (young children)  If you have any other emergent concerns regarding your health  Additional Information: Abdominal (belly) pain can be caused by many things. Your caregiver performed an examination and possibly ordered blood/urine tests and imaging (CT scan, x-rays, ultrasound). Many cases can be observed and treated at home after initial evaluation in the emergency department. Even though you are being discharged home, abdominal pain can be unpredictable. Therefore, you need a repeated exam if your pain does not resolve, returns, or worsens. Most patients with abdominal pain don't have to be admitted to the hospital or have surgery, but serious problems like appendicitis and gallbladder attacks can start out as nonspecific pain. Many abdominal conditions cannot be diagnosed in one visit, so follow-up evaluations are very important.  Your vital signs today were: BP (!) 151/83 (BP Location: Left Arm)    Pulse 81    Temp 99.1 F (37.3 C) (Oral)    Ht 6\' 3"  (1.905 m)    Wt (!) 137 kg    SpO2 96%    BMI 37.75 kg/m  If your blood pressure (bp) was elevated above 135/85 this visit, please have this repeated by your doctor within one month. --------------

## 2017-11-03 DIAGNOSIS — J209 Acute bronchitis, unspecified: Secondary | ICD-10-CM | POA: Diagnosis not present

## 2018-02-05 DIAGNOSIS — F331 Major depressive disorder, recurrent, moderate: Secondary | ICD-10-CM | POA: Diagnosis not present

## 2018-02-05 DIAGNOSIS — F1120 Opioid dependence, uncomplicated: Secondary | ICD-10-CM | POA: Diagnosis not present

## 2018-07-28 ENCOUNTER — Encounter: Payer: Self-pay | Admitting: Emergency Medicine

## 2018-07-28 ENCOUNTER — Other Ambulatory Visit: Payer: Self-pay

## 2018-07-28 ENCOUNTER — Ambulatory Visit
Admission: EM | Admit: 2018-07-28 | Discharge: 2018-07-28 | Disposition: A | Discharge status: Home / Self Care | Payer: 59 | Attending: Physician Assistant | Admitting: Physician Assistant

## 2018-07-28 ENCOUNTER — Ambulatory Visit (INDEPENDENT_AMBULATORY_CARE_PROVIDER_SITE_OTHER): Payer: 59

## 2018-07-28 DIAGNOSIS — M79672 Pain in left foot: Secondary | ICD-10-CM

## 2018-07-28 MED ORDER — MELOXICAM 7.5 MG PO TABS: 7.5000 mg | ORAL_TABLET | Freq: Every day | ORAL | 0 refills | Status: DC

## 2018-07-28 NOTE — ED Triage Notes (Signed)
Pt presents after dropping a 10lb wrench on his foot approx 2 week ago.  States the top of his foot became bruised and swollen.  Pt states the bottom of his foot started to hurt about 2-3 days later.  Today, he was walking in the woods, and he felt a pop to the same area, and now the pain has intensified and his work boot was tight when he tried to take it off.

## 2018-07-28 NOTE — ED Notes (Signed)
Patient able to ambulate independently  

## 2018-07-28 NOTE — ED Provider Notes (Signed)
[facility]    CSN: [id number] Arrival date & time: 07/28/18  1648     History   Chief Complaint Chief Complaint  Patient presents with  . Foot Injury    HPI [name] is a 28 y.o. male.   28 year old male comes in for left foot pain. Patient dropped a 10lb wrench on the left foot about 2 weeks ago. Dorsal aspect of mid 1st and 2nd MTP was swollen with contusion. He had painful weightbearing since. States 2-3 days after, started having pain to the plantar surface, worse when weightbearing after rest. Today, when he was walking in the woods, felt a pop to the same area, now with increased pain and swelling. Had generalized numbness when symptoms first started that has improved. Has not taken anything today. Did take ibuprofen for the past 2 weeks with some relief.      Past Medical History:  Diagnosis Date  . Whooping cough     There are no active problems to display for this patient.   History reviewed. No pertinent surgical history.     Home Medications    Prior to Admission medications   Medication Sig Start Date End Date Taking? Authorizing Provider  meloxicam (MOBIC) 7.5 MG tablet Take 1 tablet (7.5 mg total) by mouth daily. 07/28/18   Belinda FisherYu, Annalycia Done V, PA-C    Family History Family History  Problem Relation Age of Onset  . Hypertension Mother     Social History Social History   Tobacco Use  . Smoking status: Former Games developermoker  . Smokeless tobacco: Never Used  Substance Use Topics  . Alcohol use: No    Alcohol/week: 0.0 standard drinks  . Drug use: No     Allergies   Patient has no known allergies.   Review of Systems Review of Systems  Reason unable to perform ROS: See HPI as above.     Physical Exam Triage Vital Signs ED Triage Vitals  Enc Vitals Group     BP 07/28/18 1658 (!) 156/82     Pulse Rate 07/28/18 1658 87     Resp 07/28/18 1658 18     Temp 07/28/18 1658 98 F (36.7 C)     Temp Source 07/28/18 1658 Oral     SpO2  07/28/18 1658 96 %     Weight --      Height --      Head Circumference --      Peak Flow --      Pain Score 07/28/18 1657 8     Pain Loc --      Pain Edu? --      Excl. in GC? --    No data found.  Updated Vital Signs BP (!) 156/82 (BP Location: Left Arm)   Pulse 87   Temp 98 F (36.7 C) (Oral)   Resp 18   SpO2 96%   Physical Exam Constitutional:      General: He is not in acute distress.    Appearance: He is well-developed. He is not diaphoretic.  HENT:     Head: Normocephalic and atraumatic.  Eyes:     Conjunctiva/sclera: Conjunctivae normal.     Pupils: Pupils are equal, round, and reactive to light.  Musculoskeletal:     Comments: Contusion along the 1st and 2nd MTP with mild swelling. Tenderness to palpation diffusely of the plantar surface. Mild tenderness to palpation along 1st and 2nd MTP. Full ROM of the ankle and toes. Strength normal  and equal bilaterally. Sensation intact and equal bilaterally. Pedal pulse 2+.   Skin:    General: Skin is warm and dry.  Neurological:     Mental Status: He is alert and oriented to person, place, and time.    UC Treatments / Results  Labs (all labs ordered are listed, but only abnormal results are displayed) Labs Reviewed - No data to display  EKG   Radiology Dg Foot Complete Right  Result Date: 07/28/2018 CLINICAL DATA:  28 year old male dropped 10 lbs tool on left foot 1 week ago with pain. EXAM: RIGHT FOOT COMPLETE - 3+ VIEW COMPARISON:  None. FINDINGS: Mild 1st MTP joint space loss. Other joint spaces and alignment are preserved. Calcaneus intact. Bone mineralization is within normal limits. No fracture or dislocation identified. No discrete soft tissue injury. IMPRESSION: No acute osseous abnormality identified. Electronically Signed   By: Genevie Ann M.D.   On: 07/28/2018 17:15    Procedures Procedures (including critical care time)  Medications Ordered in UC Medications - No data to display  Initial Impression /  Assessment and Plan / UC Course  I have reviewed the triage vital signs and the nursing notes.  Pertinent labs & imaging results that were available during my care of the patient were reviewed by me and considered in my medical decision making (see chart for details).    X-ray negative for fracture or dislocation.  Discussed possible plantar fasciitis.  NSAIDs, ice compress, elevation, Ace wrap during activity.  Return precautions given.  Patient expresses understanding and agrees to plan.  Final Clinical Impressions(s) / UC Diagnoses   Final diagnoses:  Left foot pain   ED Prescriptions    Medication Sig Dispense Auth. Provider   meloxicam (MOBIC) 7.5 MG tablet Take 1 tablet (7.5 mg total) by mouth daily. 14 tablet Tobin Chad, [place] 07/28/18 1730

## 2018-07-28 NOTE — Discharge Instructions (Signed)
X-ray negative for fracture or dislocation. Start Mobic. Do not take ibuprofen (motrin/advil)/ naproxen (aleve) while on mobic.  Ice compress, elevation, Ace wrap during activity.  As discussed, you can freeze a bottle of water, and roll underneath her foot to help with the plantar surface.  This may take a few weeks to completely resolve, but should be feeling better each week.  If still having significant symptoms after 1 to 2 weeks, please follow-up with orthopedic for further evaluation needed.

## 2018-08-17 ENCOUNTER — Other Ambulatory Visit: Payer: Self-pay

## 2018-08-17 ENCOUNTER — Ambulatory Visit
Admission: EM | Admit: 2018-08-17 | Discharge: 2018-08-17 | Disposition: A | Discharge status: Home / Self Care | Payer: 59 | Attending: Physician Assistant | Admitting: Physician Assistant

## 2018-08-17 DIAGNOSIS — R21 Rash and other nonspecific skin eruption: Secondary | ICD-10-CM | POA: Diagnosis not present

## 2018-08-17 DIAGNOSIS — K625 Hemorrhage of anus and rectum: Secondary | ICD-10-CM

## 2018-08-17 DIAGNOSIS — R52 Pain, unspecified: Secondary | ICD-10-CM

## 2018-08-17 DIAGNOSIS — R6883 Chills (without fever): Secondary | ICD-10-CM

## 2018-08-17 DIAGNOSIS — R197 Diarrhea, unspecified: Secondary | ICD-10-CM

## 2018-08-17 LAB — COMPREHENSIVE METABOLIC PANEL
ALT: 75 IU/L — ABNORMAL HIGH (ref 0–44)
AST: 43 IU/L — ABNORMAL HIGH (ref 0–40)
Albumin/Globulin Ratio: 1.9 (ref 1.2–2.2)
Albumin: 4.5 g/dL (ref 4.1–5.2)
Alkaline Phosphatase: 91 IU/L (ref 39–117)
BUN/Creatinine Ratio: 12 (ref 9–20)
BUN: 11 mg/dL (ref 6–20)
Bilirubin Total: 0.3 mg/dL (ref 0.0–1.2)
CO2: 26 mmol/L (ref 20–29)
Calcium: 9.7 mg/dL (ref 8.7–10.2)
Chloride: 104 mmol/L (ref 96–106)
Creatinine, Ser: 0.95 mg/dL (ref 0.76–1.27)
GFR calc Af Amer: 125 mL/min/{1.73_m2} (ref >59)
GFR calc non Af Amer: 108 mL/min/{1.73_m2} (ref >59)
Globulin, Total: 2.4 g/dL (ref 1.5–4.5)
Glucose: 90 mg/dL (ref 65–99)
Potassium: 4.6 mmol/L (ref 3.5–5.2)
Sodium: 136 mmol/L (ref 134–144)
Total Protein: 6.9 g/dL (ref 6.0–8.5)

## 2018-08-17 LAB — CBC
Hematocrit: 47.2 % (ref 37.5–51.0)
Hemoglobin: 16.2 g/dL (ref 13.0–17.7)
MCH: 31.5 pg (ref 26.6–33.0)
MCHC: 34.3 g/dL (ref 31.5–35.7)
MCV: 92 fL (ref 79–97)
Platelets: 223 10*3/uL (ref 150–450)
RBC: 5.14 x10E6/uL (ref 4.14–5.80)
RDW: 14.2 % (ref 11.6–15.4)
WBC: 10.2 10*3/uL (ref 3.4–10.8)

## 2018-08-17 LAB — SEDIMENTATION RATE: Sed Rate: 13 mm/hr (ref 0–15)

## 2018-08-17 NOTE — Discharge Instructions (Signed)
Blood test, COVID test sent.  Continue Benadryl at this time to help with symptoms.  We will give you a call once we have results for further planning.

## 2018-08-17 NOTE — ED Provider Notes (Signed)
[facility]    CSN: [id number] Arrival date & time: 08/17/18  1057     History   Chief Complaint Chief Complaint  Patient presents with  . Rash    HPI [name] is a 28 y.o. male.   28 year old male comes in for evaluation.  Has had 6-day history of rash to the torso and diarrhea with BRB in toilet. [place] He states rash is tender to palpation and burning sensation. Denies warmth, swelling. Denies fever, chills. Has constant abdominal pain that is generalized and improved. He has bloody diarrhea with 4-5 x a day. Nausea without vomiting. Has mild dizziness. Denies URI symptoms such as cough, congestion, sore throat. Denies loss of taste/smell. Took benadryl without relief. No obvious COVID contact. Denies tick bite.      Past Medical History:  Diagnosis Date  . Whooping cough     There are no active problems to display for this patient.   History reviewed. No pertinent surgical history.     Home Medications    Prior to Admission medications   Not on File    Family History Family History  Problem Relation Age of Onset  . Hypertension Mother     Social History Social History   Tobacco Use  . Smoking status: Former Games developermoker  . Smokeless tobacco: Never Used  Substance Use Topics  . Alcohol use: No    Alcohol/week: 0.0 standard drinks  . Drug use: No     Allergies   Patient has no known allergies.   Review of Systems Review of Systems  Reason unable to perform ROS: See HPI as above.     Physical Exam Triage Vital Signs ED Triage Vitals  Enc Vitals Group     BP 08/17/18 1117 (!) 153/81     Pulse Rate 08/17/18 1117 83     Resp 08/17/18 1117 18     Temp 08/17/18 1117 98 F (36.7 C)     Temp Source 08/17/18 1117 Oral     SpO2 08/17/18 1117 97 %     Weight --      Height --      Head Circumference --      Peak Flow --      Pain Score 08/17/18 1118 7     Pain Loc --      Pain Edu? --      Excl. in GC? --    No data found.  Updated Vital Signs BP (!) 153/81 (BP Location: Left Arm)   Pulse 83   Temp 98 F (36.7 C) (Oral)   Resp 18   SpO2 97%   Physical Exam Constitutional:      General: He is not in acute distress.    Appearance: Normal appearance. He is well-developed. He is not ill-appearing, toxic-appearing or diaphoretic.  HENT:     Head: Normocephalic and atraumatic.     Mouth/Throat:     Mouth: Mucous membranes are moist.     Pharynx: Oropharynx is clear. Uvula midline.  Eyes:     Extraocular Movements: Extraocular movements intact.     Conjunctiva/sclera: Conjunctivae normal.     Pupils: Pupils are equal, round, and reactive to light.  Neck:     Musculoskeletal: Normal range of motion and neck supple.  Cardiovascular:     Rate and Rhythm: Normal rate and regular rhythm.     Heart sounds: Normal heart sounds. No murmur. No friction rub. No gallop.  Pulmonary:     Effort: Pulmonary effort is normal. No accessory muscle usage, prolonged expiration, respiratory distress or retractions.     Breath sounds: Normal breath sounds. No wheezing or rales.     Comments: Lungs clear to auscultation without adventitious lung sounds. Abdominal:     General: Bowel sounds are normal.     Palpations: Abdomen is soft.     Tenderness: There is no abdominal tenderness. There is no right CVA tenderness, left CVA tenderness, guarding or rebound.  Skin:    General: Skin is warm and dry.     Comments: See picture below. No warmth. Tenderness to palpation. Nonblanching. Raised.   Neurological:     General: No focal deficit present.     Mental Status: He is alert and oriented to person, place, and time.  Psychiatric:        Behavior: Behavior normal.        Judgment: Judgment normal.               UC Treatments / Results  Labs (all labs ordered are listed, but only abnormal results are displayed) Labs Reviewed  COMPREHENSIVE METABOLIC PANEL - Abnormal;  Notable for the following components:      Result Value   AST 43 (*)    ALT 75 (*)    All other components within normal limits   Narrative:    Performed at:  [facility], [place] [employer], [place]  [id number] Lab Director: Lindon Romp MD, Phone:  [phone]  CBC   Narrative:    Performed at:  [facility], [place], [place]  [id number] Lab Director: Lindon Romp MD, Phone:  [phone]  SEDIMENTATION RATE   Narrative:    Performed at:  [facility] [place], [place], [place]  [id number] Lab Director: Lindon Romp MD, Phone:  [phone]    EKG   Radiology No results found.  Procedures Procedures (including critical care time)  Medications Ordered in UC Medications - No data to display  Initial Impression / Assessment and Plan / UC Course  I have reviewed the triage vital signs and the nursing notes.  Pertinent labs & imaging results that were available during my care of the patient were reviewed by me and considered in my medical decision making (see chart for details).    Discussed case with Dr. Meda Coffee.  28 year old male with onset of rash and bloody diarrhea for the past 3 days.  Had recently been at the beach. ?  Dermatitis due to sun exposure, infectious diarrhea.  Given recent travel, will also test for COVID.  Will draw CBC, CMP, sed rate for further evaluation.  Return precautions given.  Case discussed with Dr. Meda Coffee, who agrees to plan.  Blood work results apart from slight elevation of ALT/AST, within normal limits.  Called patient to inform of results.  At this time, will continue to monitor symptoms, and continue symptomatic treatment.  To follow-up for further evaluation if develop other symptoms.  Will await COVID results.  Patient expresses understanding and agrees with plan.  Final Clinical Impressions(s) / UC Diagnoses   Final diagnoses:   Rash and nonspecific skin eruption  Body aches  Chills  Diarrhea, unspecified type  BRBPR (bright red blood per rectum)   ED Prescriptions    None        Ok Edwards, PA-C 08/17/18  1911  

## 2018-08-17 NOTE — ED Triage Notes (Signed)
Pt states started Meloxicam on 07/1018 for foot injury and also taking ibuprofen for pain. States last Thursday while at beach he developed a rash all over abdomen and back. States also having blood in stool at the same time. States taking benadryl with no relief.

## 2018-08-19 LAB — NOVEL CORONAVIRUS, NAA: SARS-CoV-2, NAA: NOT DETECTED

## 2018-10-01 IMAGING — CT CT ABD-PELV W/ CM
2 of 4 series · 16 of 46 positions shown, 18 images · IV contrast (ISOVUE)
Comparison: None.

CLINICAL DATA: Periumbilical abdominal pain. Whooping cough. Left
upper quadrant pop after coughing.

EXAM:
CT ABDOMEN AND PELVIS WITH CONTRAST
TECHNIQUE: Multidetector CT imaging of the abdomen and pelvis was performed
using the standard protocol following bolus administration of
intravenous contrast.
CONTRAST:  100mL [id number] IOPAMIDOL ([id number]) INJECTION 61%

[Series 2: axial st · axial · 0.77mm/px · z∈[+1016,+1526]mm · 13 of 114 slices shown, 15 images]
[im 6/114  soft-tissue]
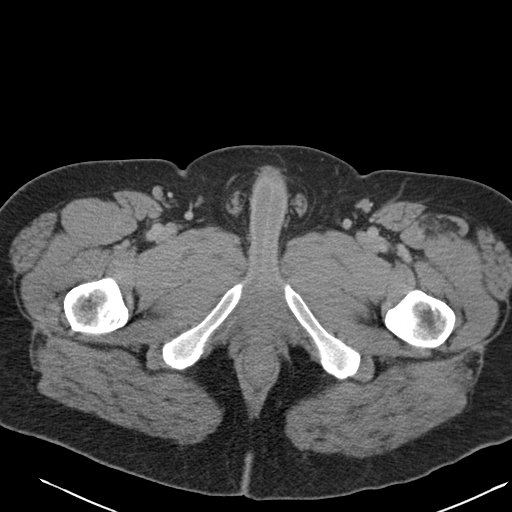
[im 6/114  bone]
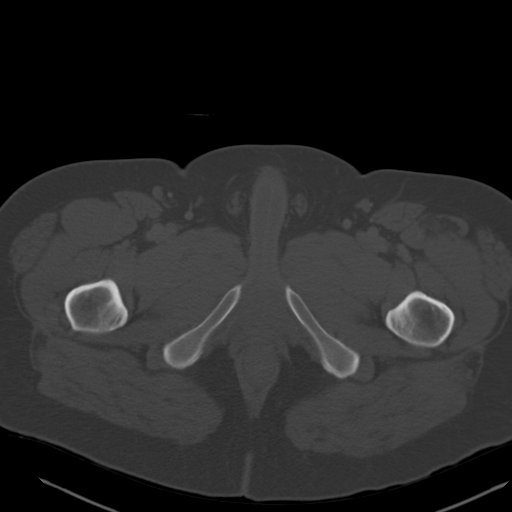
[im 18/114  soft-tissue]
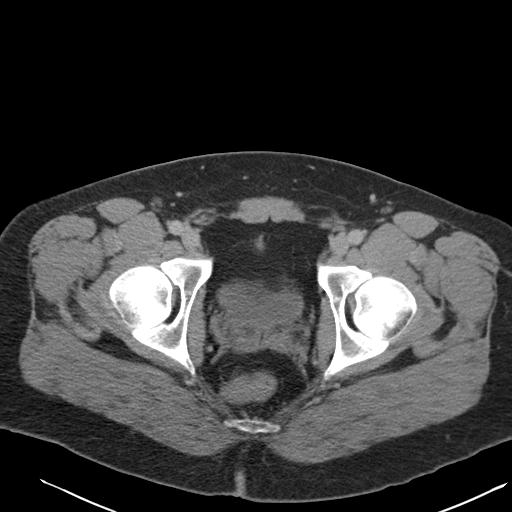
[im 24/114  soft-tissue]
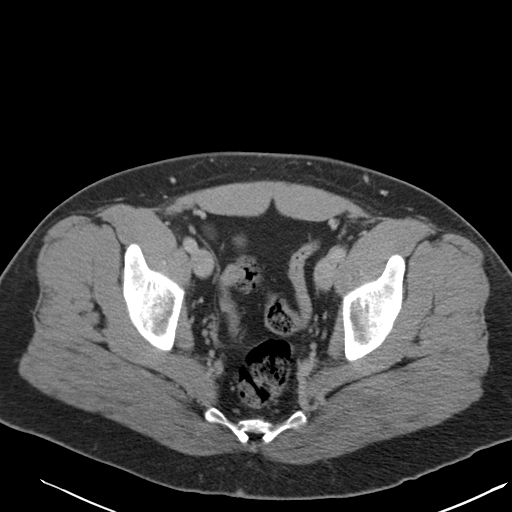
[im 30/114  soft-tissue]
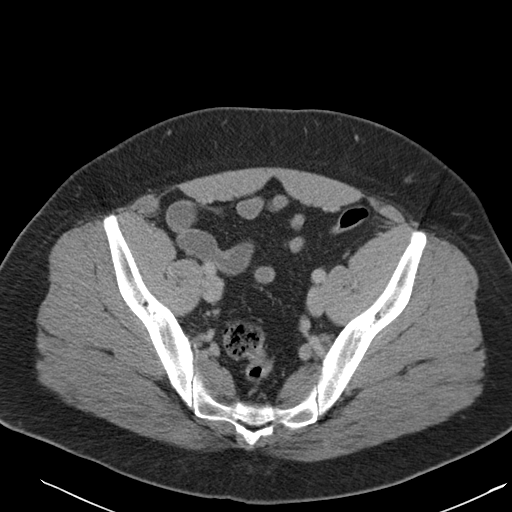
[im 42/114  soft-tissue]
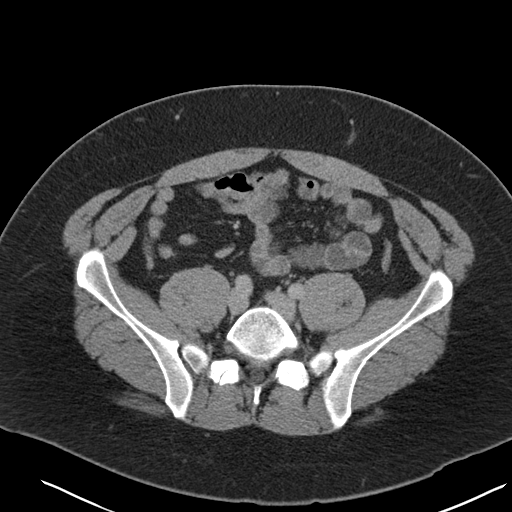
[im 48/114  soft-tissue]
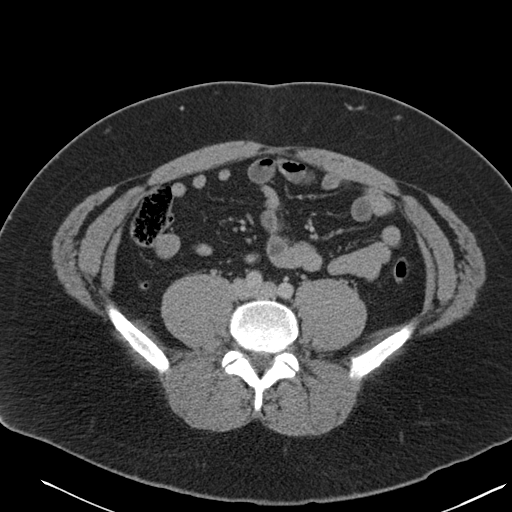
[im 60/114  soft-tissue]
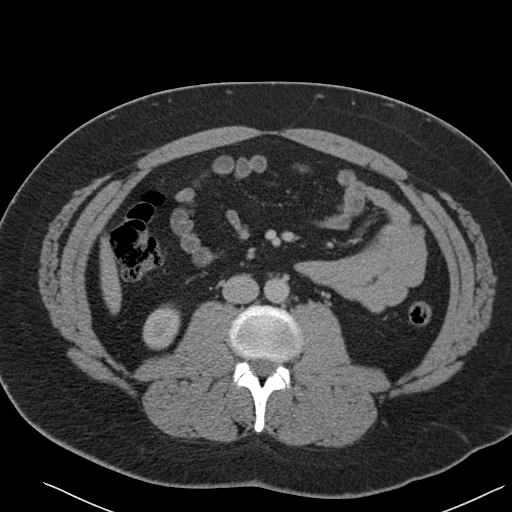
[im 66/114  soft-tissue]
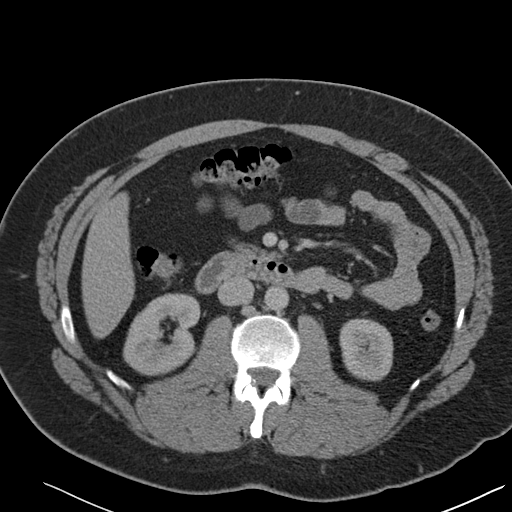
[im 72/114  soft-tissue]
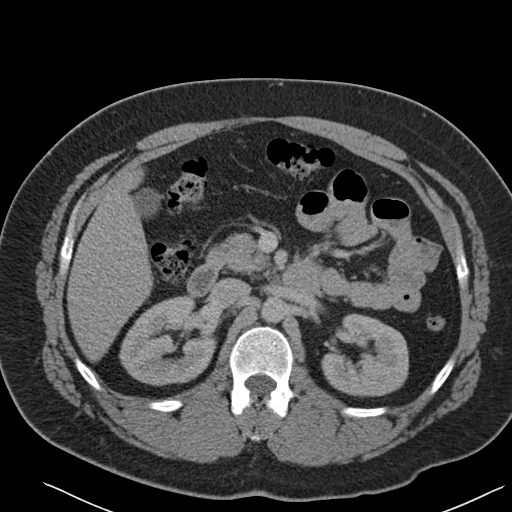
[im 72/114  bone]
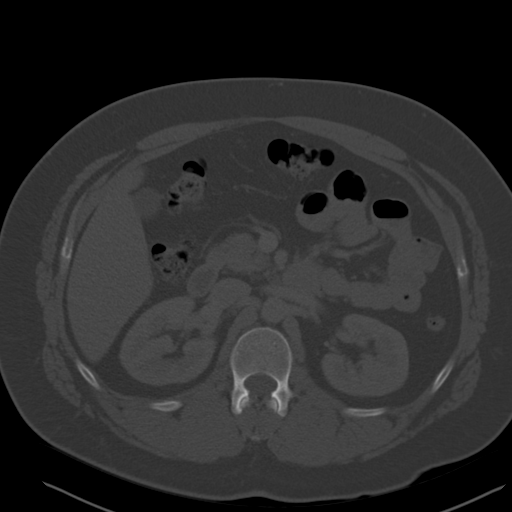
[im 84/114  soft-tissue]
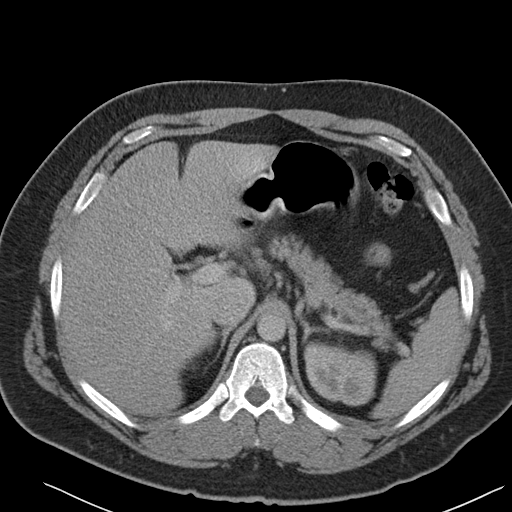
[im 90/114  soft-tissue]
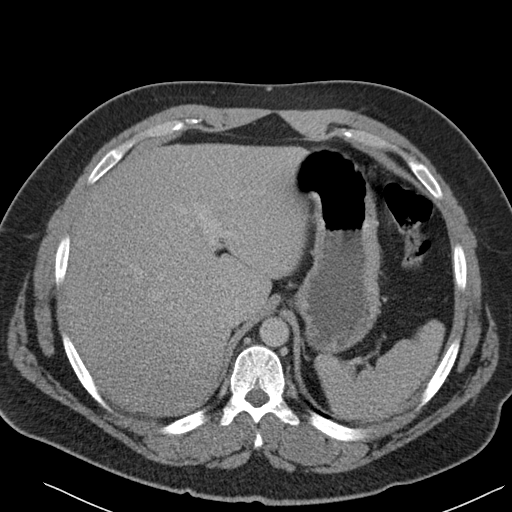
[im 96/114  soft-tissue]
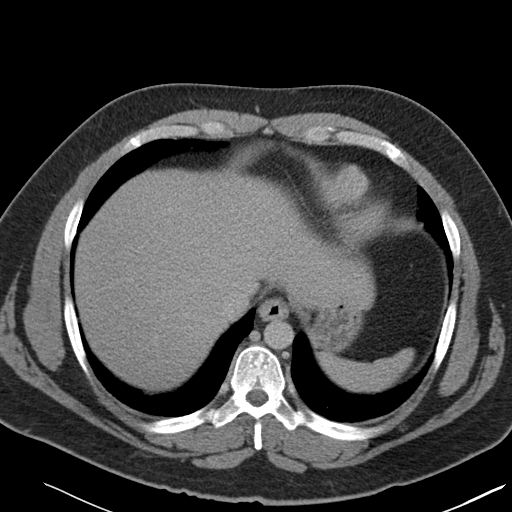
[im 108/114  soft-tissue]
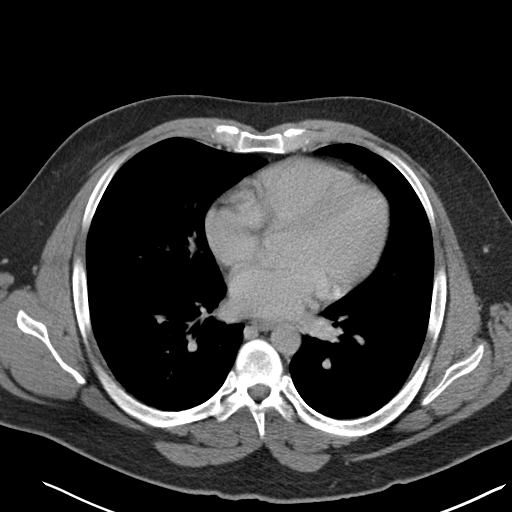

[Series 4: coronal st · coronal · 0.86mm/px · 3 of 94 slices shown]
[im 32/94  soft-tissue]
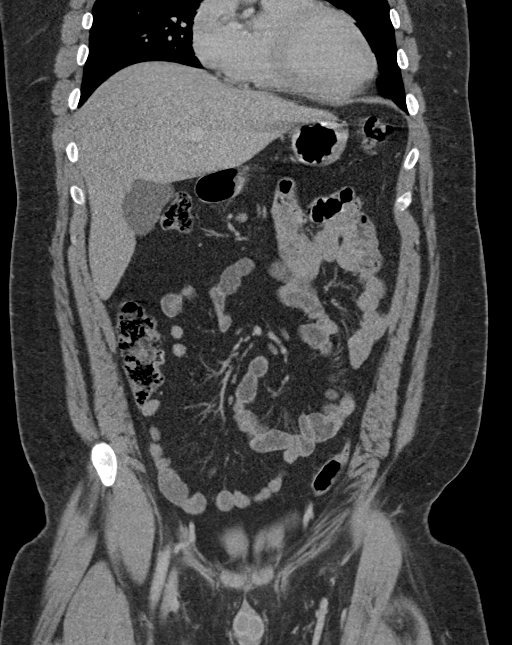
[im 42/94  soft-tissue]
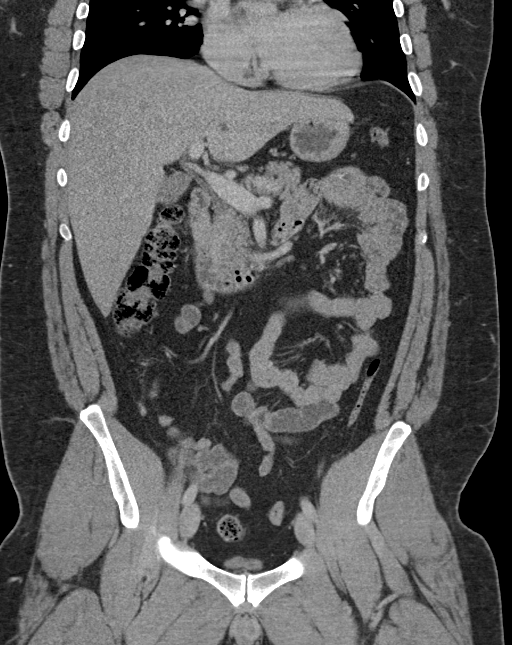
[im 52/94  soft-tissue]
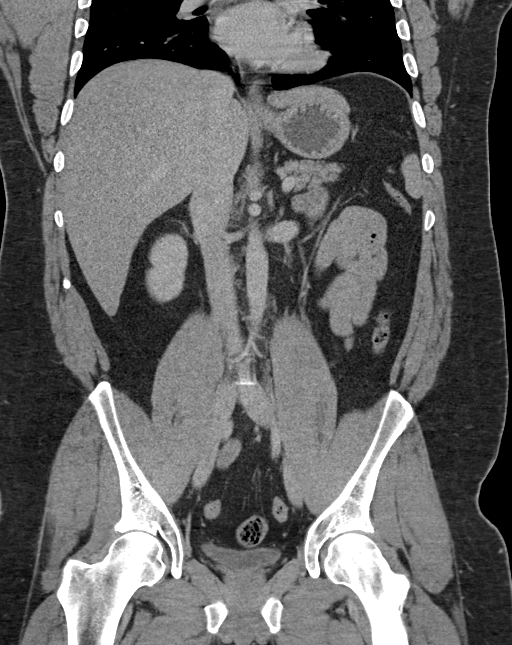

[16 of 46 positions shown; findings below may reference images not displayed]

FINDINGS: Lower chest: No significant pulmonary nodules or acute consolidative
airspace disease.

Hepatobiliary: Normal liver size. No liver mass. Normal gallbladder
with no radiopaque cholelithiasis. No biliary ductal dilatation.

Pancreas: Normal, with no mass or duct dilation.

Spleen: Normal size. No mass.

Adrenals/Urinary Tract: Normal adrenals. Normal kidneys with no
hydronephrosis and no renal mass. Normal bladder.

Stomach/Bowel: Normal non-distended stomach. Normal caliber small
bowel with no small bowel wall thickening. Normal appendix. Normal
large bowel with no diverticulosis, large bowel wall thickening or
pericolonic fat stranding.

Vascular/Lymphatic: Normal caliber abdominal aorta. No
pathologically enlarged lymph nodes in the abdomen or pelvis.

Reproductive: Normal size prostate

Other: No pneumoperitoneum, ascites or focal fluid collection. No
evidence of a ventral abdominal hernia.

Musculoskeletal: No aggressive appearing focal osseous lesions.
Symmetric mild gynecomastia.
IMPRESSION: No acute abnormality. No evidence of bowel obstruction or acute
bowel inflammation. No evidence of ventral abdominal hernia.

## 2018-11-10 ENCOUNTER — Other Ambulatory Visit: Payer: Self-pay

## 2018-11-10 DIAGNOSIS — Z20822 Contact with and (suspected) exposure to covid-19: Secondary | ICD-10-CM

## 2018-11-12 LAB — NOVEL CORONAVIRUS, NAA: SARS-CoV-2, NAA: NOT DETECTED

## 2019-02-23 ENCOUNTER — Ambulatory Visit: Payer: BC Managed Care – PPO | Admitting: Podiatry

## 2019-02-23 ENCOUNTER — Telehealth: Payer: Self-pay | Admitting: *Deleted

## 2019-02-23 ENCOUNTER — Other Ambulatory Visit: Payer: Self-pay

## 2019-02-23 ENCOUNTER — Ambulatory Visit (INDEPENDENT_AMBULATORY_CARE_PROVIDER_SITE_OTHER): Payer: BC Managed Care – PPO

## 2019-02-23 DIAGNOSIS — M79672 Pain in left foot: Secondary | ICD-10-CM | POA: Diagnosis not present

## 2019-02-23 DIAGNOSIS — S93692D Other sprain of left foot, subsequent encounter: Secondary | ICD-10-CM | POA: Diagnosis not present

## 2019-02-23 DIAGNOSIS — S93692A Other sprain of left foot, initial encounter: Secondary | ICD-10-CM

## 2019-02-23 MED ORDER — HYDROCODONE-ACETAMINOPHEN 5-325 MG PO TABS: 1.0000 | ORAL_TABLET | Freq: Three times a day (TID) | ORAL | 0 refills | Status: DC | PRN

## 2019-02-23 MED ORDER — DICLOFENAC SODIUM 75 MG PO TBEC: 75.0000 mg | DELAYED_RELEASE_TABLET | Freq: Two times a day (BID) | ORAL | 1 refills | Status: DC

## 2019-02-23 NOTE — Telephone Encounter (Deleted)
-----   Message from Felecia Shelling, DPM sent at 02/23/2019  8:56 AM EST ----- Regarding: MRI left heel Please order MRI left heel w/out contrast  Dx: rupture plantar fascia x 6-7 months ago. Possible rupture tendon LT.   Thanks, Dr. Logan Bores  #I'll get note done at end-of-day

## 2019-02-24 NOTE — Telephone Encounter (Signed)
-----   Message from Brent M Evans, DPM sent at 02/23/2019  8:56 AM EST ----- Regarding: MRI left heel Please order MRI left heel w/out contrast  Dx: rupture plantar fascia x 6-7 months ago. Possible rupture tendon LT.   Thanks, Dr. Evans  #I'll get note done at end-of-day  

## 2019-02-24 NOTE — Telephone Encounter (Signed)
Orders to A. Prevette, CMA for pre-cert, faxed orders to [facility].

## 2019-02-25 ENCOUNTER — Other Ambulatory Visit: Payer: Self-pay | Admitting: Podiatry

## 2019-02-25 ENCOUNTER — Telehealth: Payer: Self-pay | Admitting: Podiatry

## 2019-02-25 DIAGNOSIS — S93692D Other sprain of left foot, subsequent encounter: Secondary | ICD-10-CM

## 2019-02-25 NOTE — Telephone Encounter (Signed)
[employer]  Request Status: Authorized  Health Plan: [employer]   Order ID: [id number]  Valid Dates: 02/24/2019 - 08/22/2019

## 2019-02-25 NOTE — Telephone Encounter (Signed)
Pt was seen in office on Wednesday and is to be scheduled for an MRI, pt calling to follow up.

## 2019-02-25 NOTE — Telephone Encounter (Signed)
I spoke with pt and informed that I had ordered the MRI yesterday morning and the pre-cert staff was working on it and he could call [facility] [phone] and schedule about 5 days from yesterday.

## 2019-03-01 NOTE — Progress Notes (Signed)
   HPI: 29 y.o. male presenting today otherwise healthy as a new patient for evaluation of left foot pain that began approximately 6-7 months ago.  Patient states that approximately 6 to 7 months ago his left foot was hurting and he heard a loud pop to the plantar aspect of the left foot with immediate pain and tenderness.  He eventually went to [facility] orthopedics who placed him in a cam boot.  He was diagnosed with a plantar fascial rupture.  This did not improve his symptoms and so they put him into a cast that he states fell apart.  Patient was very frustrated with the treatment and did not follow-up for continued care.  This is now been going on approximately 7 months and he continues to have severe pain and tenderness to the foot.  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  He wears steel toed boots.  Patient rates the pain on a daily basis 8/10.  The pain is not improved and so he presents today for a second opinion and treatment and evaluation  Past Medical History:  Diagnosis Date  . Whooping cough      Physical Exam: General: The patient is alert and oriented x3 in no acute distress.  Dermatology: Skin is warm, dry and supple bilateral lower extremities. Negative for open lesions or macerations.  Vascular: Palpable pedal pulses bilaterally. No edema or erythema noted. Capillary refill within normal limits.  Neurological: Epicritic and protective threshold grossly intact bilaterally.   Musculoskeletal Exam: Range of motion within normal limits to all pedal and ankle joints bilateral. Muscle strength 5/5 in all groups bilateral.  Tenderness to palpation noted along the plantar fascia left foot.  There is some associated swelling as well.  Radiographic Exam:  Normal osseous mineralization. Joint spaces preserved. No fracture/dislocation/boney destruction.    Assessment: 1.  Chronic plantar fasciitis left 2.  Possible plantar fascial rupture left   Plan of Care:  1.  Patient evaluated. X-Rays reviewed.  2.  Prescription for diclofenac 75 mg 2 times daily 3.  Prescription for Vicodin 5/325 mg #30 4.  Cam boot dispensed today 5.  The patient has had severe left heel pain for greater than 6 months now despite conservative treatments.  Order for an MRI left heel placed. 6.  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 Felecia Shelling, DPM    [address] [place], [place] [address]                Office [phone]  Fax [phone]

## 2019-03-03 ENCOUNTER — Other Ambulatory Visit: Payer: Self-pay

## 2019-03-03 ENCOUNTER — Ambulatory Visit
Admission: RE | Admit: 2019-03-03 | Discharge: 2019-03-03 | Disposition: A | Discharge status: Home / Self Care | Payer: BC Managed Care – PPO | Source: Ambulatory Visit | Attending: Podiatry | Admitting: Podiatry

## 2019-03-03 ENCOUNTER — Other Ambulatory Visit: Payer: Self-pay | Admitting: Podiatry

## 2019-03-04 ENCOUNTER — Other Ambulatory Visit: Payer: Self-pay | Admitting: Podiatry

## 2019-03-05 MED ORDER — HYDROCODONE-ACETAMINOPHEN 5-325 MG PO TABS: 1.0000 | ORAL_TABLET | Freq: Three times a day (TID) | ORAL | 0 refills | Status: DC | PRN

## 2019-03-09 ENCOUNTER — Other Ambulatory Visit: Payer: Self-pay

## 2019-03-09 ENCOUNTER — Ambulatory Visit: Payer: BC Managed Care – PPO | Admitting: Podiatry

## 2019-03-09 ENCOUNTER — Encounter: Payer: Self-pay | Admitting: Podiatry

## 2019-03-09 DIAGNOSIS — S93692A Other sprain of left foot, initial encounter: Secondary | ICD-10-CM

## 2019-03-09 DIAGNOSIS — M722 Plantar fascial fibromatosis: Secondary | ICD-10-CM | POA: Diagnosis not present

## 2019-03-09 MED ORDER — OXYCODONE-ACETAMINOPHEN 5-325 MG PO TABS: 1.0000 | ORAL_TABLET | Freq: Two times a day (BID) | ORAL | 0 refills | Status: DC | PRN

## 2019-03-09 NOTE — Patient Instructions (Signed)
Pre-Operative Instructions  Congratulations, you have decided to take an important step towards improving your quality of life.  You can be assured that the doctors and staff at [facility] will be with you every step of the way.  Here are some important things you should know:  1. Plan to be at the surgery center/hospital at least 1 (one) hour prior to your scheduled time, unless otherwise directed by the surgical center/hospital staff.  You must have a responsible adult accompany you, remain during the surgery and drive you home.  Make sure you have directions to the surgical center/hospital to ensure you arrive on time. 2. If you are having surgery at [facility] or [facility] hospitals, you will need a copy of your medical history and physical form from your family physician within one month prior to the date of surgery. We will give you a form for your primary physician to complete.  3. We make every effort to accommodate the date you request for surgery.  However, there are times where surgery dates or times have to be moved.  We will contact you as soon as possible if a change in schedule is required.   4. No aspirin/ibuprofen for one week before surgery.  If you are on aspirin, any non-steroidal anti-inflammatory medications (Mobic, Aleve, Ibuprofen) should not be taken seven (7) days prior to your surgery.  You make take Tylenol for pain prior to surgery.  5. Medications - If you are taking daily heart and blood pressure medications, seizure, reflux, allergy, asthma, anxiety, pain or diabetes medications, make sure you notify the surgery center/hospital before the day of surgery so they can tell you which medications you should take or avoid the day of surgery. 6. No food or drink after midnight the night before surgery unless directed otherwise by surgical center/hospital staff. 7. No alcoholic beverages 24-hours prior to surgery.  No smoking 24-hours prior or 24-hours after  surgery. 8. Wear loose pants or shorts. They should be loose enough to fit over bandages, boots, and casts. 9. Don't wear slip-on shoes. Sneakers are preferred. 10. Bring your boot with you to the surgery center/hospital.  Also bring crutches or a walker if your physician has prescribed it for you.  If you do not have this equipment, it will be provided for you after surgery. 11. If you have not been contacted by the surgery center/hospital by the day before your surgery, call to confirm the date and time of your surgery. 12. Leave-time from work may vary depending on the type of surgery you have.  Appropriate arrangements should be made prior to surgery with your employer. 13. Prescriptions will be provided immediately following surgery by your doctor.  Fill these as soon as possible after surgery and take the medication as directed. Pain medications will not be refilled on weekends and must be approved by the doctor. 14. Remove nail polish on the operative foot and avoid getting pedicures prior to surgery. 15. Wash the night before surgery.  The night before surgery wash the foot and leg well with water and the antibacterial soap provided. Be sure to pay special attention to beneath the toenails and in between the toes.  Wash for at least three (3) minutes. Rinse thoroughly with water and dry well with a towel.  Perform this wash unless told not to do so by your physician.  Enclosed: 1 Ice pack (please put in freezer the night before surgery)   1 Hibiclens skin cleaner     Pre-op instructions  If you have any questions regarding the instructions, please do not hesitate to call our office.  [place]: [address], [place], [place] [address] -- [phone]  [place]: [address] [place], [place] [address] -- [phone]  [place]: [address], [place],  [address] -- [phone]   Website: [email or web address] 

## 2019-03-14 NOTE — Progress Notes (Signed)
   HPI: 29 y.o. male presenting today for follow up evaluation of left foot pain. He reports continued pain since his last visit. Being on the foot increases the pain. He has been using the CAM boot, taking Vicodin and Diclofenac as directed with no significant relief. He had an MRI done on 03/04/2019 and is here to review the results. Patient is here for further evaluation and treatment.   Past Medical History:  Diagnosis Date  . Whooping cough      Physical Exam: General: The patient is alert and oriented x3 in no acute distress.  Dermatology: Skin is warm, dry and supple bilateral lower extremities. Negative for open lesions or macerations.  Vascular: Palpable pedal pulses bilaterally. No edema or erythema noted. Capillary refill within normal limits.  Neurological: Epicritic and protective threshold grossly intact bilaterally.   Musculoskeletal Exam: Range of motion within normal limits to all pedal and ankle joints bilateral. Muscle strength 5/5 in all groups bilateral.  Tenderness to palpation noted along the plantar fascia left foot.  There is some associated swelling as well.  MRI Impression (done on 03/04/2019):  Focal nodular thickening in the medial cord of the plantar fascia with intermediate increased T2 signal could be due to subacute, incomplete rupture. Mild thickening of the more proximal plantar fascia without edema is suggestive of chronic fasciitis. Alternatively, the focal nodule could be a plantar fibroma with an interstitial tear within it.  Longitudinal split tear of the peroneus brevis just inferior to the lateral malleolus to just beyond the peroneal tubercle of the calcaneus.  Findings consistent with a radiopaque foreign body in the cutaneous tissues of the heel. The foreign body is likely microscopic as no foreign body is seen on the prior plain films.  Assessment: 1. Chronic plantar fasciitis left 2. Possible plantar fascial rupture left   Plan of Care:    1. Patient evaluated. MRI reviewed.  2. Today we discussed the conservative versus surgical management of the presenting pathology. The patient opts for surgical management. All possible complications and details of the procedure were explained. All patient questions were answered. No guarantees were expressed or implied. 3. Authorization for surgery was initiated today. Surgery will consist of EPF left.  4. Injection of 0.5 mLs Celestone Soluspan injected into the left heel.  5. Prescription for Percocet 5/325 mg provided to patient.  6. Return to clinic one week post op.   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 Felecia Shelling, DPM    [address] [place], [place] [address]                Office [phone]  Fax [phone]

## 2019-03-16 ENCOUNTER — Other Ambulatory Visit: Payer: Self-pay | Admitting: Podiatry

## 2019-03-17 ENCOUNTER — Encounter: Payer: Self-pay | Admitting: Podiatry

## 2019-03-18 ENCOUNTER — Other Ambulatory Visit: Payer: Self-pay | Admitting: Podiatry

## 2019-03-21 ENCOUNTER — Telehealth: Payer: Self-pay | Admitting: *Deleted

## 2019-03-21 MED ORDER — OXYCODONE-ACETAMINOPHEN 5-325 MG PO TABS: 1.0000 | ORAL_TABLET | Freq: Two times a day (BID) | ORAL | 0 refills | Status: DC | PRN

## 2019-03-21 NOTE — Telephone Encounter (Signed)
I spoke with pt and he states he spoke with pharmacy and it is all cleared up.

## 2019-03-21 NOTE — Telephone Encounter (Signed)
Pt states he's medication is not at the pharmacy.

## 2019-04-06 ENCOUNTER — Other Ambulatory Visit: Payer: Self-pay | Admitting: Podiatry

## 2019-04-07 ENCOUNTER — Encounter: Payer: Self-pay | Admitting: Podiatry

## 2019-04-07 MED ORDER — OXYCODONE-ACETAMINOPHEN 5-325 MG PO TABS: 1.0000 | ORAL_TABLET | Freq: Two times a day (BID) | ORAL | 0 refills | Status: DC | PRN

## 2019-04-13 ENCOUNTER — Telehealth: Payer: Self-pay

## 2019-04-13 DIAGNOSIS — M79676 Pain in unspecified toe(s): Secondary | ICD-10-CM

## 2019-04-13 NOTE — Telephone Encounter (Signed)
DOS 04/28/2019  EPF LT - 17510  [employer] ST EFFECTIVE DATE - 01/28/19    In-Network   Max Per Benefit Period Year-to-Date Remaining     CoInsurance 20%      Deductible $1250.00 $417.52     Out-Of-Pocket $4890.00 $2585.27

## 2019-04-28 ENCOUNTER — Other Ambulatory Visit: Payer: Self-pay | Admitting: Podiatry

## 2019-04-28 ENCOUNTER — Encounter: Payer: Self-pay | Admitting: Podiatry

## 2019-04-28 DIAGNOSIS — M722 Plantar fascial fibromatosis: Secondary | ICD-10-CM

## 2019-04-28 MED ORDER — OXYCODONE-ACETAMINOPHEN 5-325 MG PO TABS: 1.0000 | ORAL_TABLET | Freq: Two times a day (BID) | ORAL | 0 refills | Status: DC | PRN

## 2019-04-28 NOTE — Progress Notes (Signed)
PRN postop 

## 2019-05-01 ENCOUNTER — Encounter: Payer: Self-pay | Admitting: Podiatry

## 2019-05-02 ENCOUNTER — Other Ambulatory Visit: Payer: Self-pay | Admitting: Podiatry

## 2019-05-02 ENCOUNTER — Encounter: Payer: Self-pay | Admitting: Podiatry

## 2019-05-02 MED ORDER — OXYCODONE-ACETAMINOPHEN 10-325 MG PO TABS: 1.0000 | ORAL_TABLET | Freq: Three times a day (TID) | ORAL | 0 refills | Status: DC | PRN

## 2019-05-02 NOTE — Telephone Encounter (Signed)
Rx percocet 10/325 sent to pharmacy. - Dr. Afnan Cadiente

## 2019-05-02 NOTE — Progress Notes (Signed)
PRN postop pain 

## 2019-05-02 NOTE — Telephone Encounter (Signed)
I spoke with pt and he states he is having throbbing pain, and just not being able to walk on it, but he he wouldn't be able to. I told pt with throbbing there were comfort measures that he could do to help relieve throbbing. I told pt to sit down remove the boot, open-ended sock, and ace wrap only, elevate the foot for 15 minutes, but if the pain worsened dangle the foot for 15 minutes this being the only time it was okay to dangle the surgery foot, then place foot at hip level and rewrap the ace wrap looser starting at the toes and rolling down the foot and up the leg, replace the sock and boot. I told pt I would inform Dr. Logan Bores of his request for stronger pain medication, to continue with the ibuprofen and percocet as prescribed.

## 2019-05-04 ENCOUNTER — Other Ambulatory Visit: Payer: Self-pay

## 2019-05-04 ENCOUNTER — Ambulatory Visit (INDEPENDENT_AMBULATORY_CARE_PROVIDER_SITE_OTHER): Payer: BC Managed Care – PPO

## 2019-05-04 ENCOUNTER — Other Ambulatory Visit: Payer: Self-pay | Admitting: Podiatry

## 2019-05-04 ENCOUNTER — Ambulatory Visit (INDEPENDENT_AMBULATORY_CARE_PROVIDER_SITE_OTHER): Payer: Self-pay | Admitting: Podiatry

## 2019-05-04 DIAGNOSIS — M722 Plantar fascial fibromatosis: Secondary | ICD-10-CM

## 2019-05-04 DIAGNOSIS — Z9889 Other specified postprocedural states: Secondary | ICD-10-CM

## 2019-05-04 DIAGNOSIS — S93692A Other sprain of left foot, initial encounter: Secondary | ICD-10-CM

## 2019-05-04 DIAGNOSIS — S93692D Other sprain of left foot, subsequent encounter: Secondary | ICD-10-CM | POA: Diagnosis not present

## 2019-05-04 NOTE — Progress Notes (Signed)
   Subjective:  Patient presents today status post endoscopic plantar fasciotomy left. DOS: 04/28/2019.  Patient continues to report pain.  He is been using the knee scooter to take pressure off of the heel.  No new complaints at this time  Past Medical History:  Diagnosis Date  . Whooping cough       Objective/Physical Exam Neurovascular status intact.  Skin incisions appear to be well coapted with sutures intact. No sign of infectious process noted. No dehiscence. No active bleeding noted. Moderate edema noted to the surgical extremity.  Radiographic Exam:  Orthopedic hardware and osteotomies sites appear to be stable with routine healing.  Assessment: 1. s/p endoscopic plantar fasciotomy left. DOS: 04/28/2019   Plan of Care:  1. Patient was evaluated. X-rays reviewed 2.  Dressings changed today.   3.  Continue weightbearing in the cam boot 4.  Return to clinic in 1 week for suture removal   Felecia Shelling, DPM [facility]  Dr. Felecia Shelling, DPM    [address]                                        [place], [place] [address]                Office [phone]  Fax [phone]

## 2019-05-05 ENCOUNTER — Other Ambulatory Visit: Payer: Self-pay | Admitting: Podiatry

## 2019-05-06 ENCOUNTER — Other Ambulatory Visit: Payer: Self-pay | Admitting: Podiatry

## 2019-05-06 DIAGNOSIS — S93692D Other sprain of left foot, subsequent encounter: Secondary | ICD-10-CM

## 2019-05-06 DIAGNOSIS — Z9889 Other specified postprocedural states: Secondary | ICD-10-CM

## 2019-05-06 MED ORDER — OXYCODONE-ACETAMINOPHEN 10-325 MG PO TABS: 1.0000 | ORAL_TABLET | Freq: Three times a day (TID) | ORAL | 0 refills | Status: AC | PRN

## 2019-05-09 ENCOUNTER — Encounter: Payer: Self-pay | Admitting: Podiatry

## 2019-05-11 ENCOUNTER — Encounter: Payer: BC Managed Care – PPO | Admitting: Podiatry

## 2019-05-16 ENCOUNTER — Ambulatory Visit (INDEPENDENT_AMBULATORY_CARE_PROVIDER_SITE_OTHER): Payer: BC Managed Care – PPO | Admitting: Podiatry

## 2019-05-16 ENCOUNTER — Encounter: Payer: Self-pay | Admitting: Podiatry

## 2019-05-16 ENCOUNTER — Other Ambulatory Visit: Payer: Self-pay

## 2019-05-16 DIAGNOSIS — Z9889 Other specified postprocedural states: Secondary | ICD-10-CM

## 2019-05-16 DIAGNOSIS — S93692D Other sprain of left foot, subsequent encounter: Secondary | ICD-10-CM

## 2019-05-17 ENCOUNTER — Encounter: Payer: Self-pay | Admitting: Podiatry

## 2019-05-18 ENCOUNTER — Encounter: Payer: Self-pay | Admitting: Podiatry

## 2019-05-18 NOTE — Telephone Encounter (Signed)
Pt called and stated that Dr. Logan Bores gave him a return to work date of 06/01. Pt would like two copies of the notes and wants to pick them up in the morning. Please advise.

## 2019-05-18 NOTE — Progress Notes (Signed)
   Subjective:  Patient presents today status post endoscopic plantar fasciotomy left. DOS: 04/28/2019. He reports continued soreness of the foot. He has been using the CAM boot as directed. He denies any modifying factors. Patient is here for further evaluation and treatment.   Past Medical History:  Diagnosis Date  . Whooping cough       Objective/Physical Exam Neurovascular status intact.  Skin incisions appear to be well coapted with sutures intact. No sign of infectious process noted. No dehiscence. No active bleeding noted. Moderate edema noted to the surgical extremity.  Assessment: 1. s/p endoscopic plantar fasciotomy left. DOS: 04/28/2019   Plan of Care:  1. Patient was evaluated.  2. Sutures removed.  3. Transition out of CAM boot.  4. Return to clinic in 3 weeks.    Felecia Shelling, DPM [facility]  Dr. Felecia Shelling, DPM    [address]                                        [place], [place] [address]                Office [phone]  Fax [phone]

## 2019-05-25 ENCOUNTER — Encounter: Payer: BC Managed Care – PPO | Admitting: Podiatry

## 2019-06-06 ENCOUNTER — Other Ambulatory Visit: Payer: Self-pay

## 2019-06-06 ENCOUNTER — Ambulatory Visit (INDEPENDENT_AMBULATORY_CARE_PROVIDER_SITE_OTHER): Payer: BC Managed Care – PPO | Admitting: Podiatry

## 2019-06-06 DIAGNOSIS — S93692D Other sprain of left foot, subsequent encounter: Secondary | ICD-10-CM

## 2019-06-06 DIAGNOSIS — Z9889 Other specified postprocedural states: Secondary | ICD-10-CM

## 2019-06-09 NOTE — Progress Notes (Signed)
   Subjective:  Patient presents today status post endoscopic plantar fasciotomy left. DOS: 04/28/2019. He reports intermittent sharp pain that has now evolved into intermittent throbbing pain. Walking makes the pain worse. He has been taking Tylenol and Ibuprofen for treatment which is helping. Patient is here for further evaluation and treatment.   Past Medical History:  Diagnosis Date  . Whooping cough       Objective/Physical Exam Neurovascular status intact.  Skin incisions appear to be well. No sign of infectious process noted. No dehiscence. No active bleeding noted. Moderate edema noted to the surgical extremity.  Assessment: 1. s/p endoscopic plantar fasciotomy left. DOS: 04/28/2019   Plan of Care:  1. Patient was evaluated.  2. Stressed importance of daily stretching.  3. We will not pursue physical therapy at this time if patient can do stretches at home.  4. Patient set to return to work on 06/28/2019. We may need to extend for one additional month.  5. Return to clinic in 3 weeks.     Felecia Shelling, DPM [facility]  Dr. Felecia Shelling, DPM    [address]                                        [place], [place] [address]                Office [phone]  Fax [phone]

## 2019-06-13 ENCOUNTER — Encounter: Payer: Self-pay | Admitting: Podiatry

## 2019-06-14 ENCOUNTER — Other Ambulatory Visit: Payer: Self-pay | Admitting: Podiatry

## 2019-06-14 MED ORDER — OXYCODONE-ACETAMINOPHEN 5-325 MG PO TABS: 1.0000 | ORAL_TABLET | Freq: Three times a day (TID) | ORAL | 0 refills | Status: DC | PRN

## 2019-06-14 NOTE — Progress Notes (Signed)
PRN pain 

## 2019-06-20 ENCOUNTER — Encounter: Payer: Self-pay | Admitting: Podiatry

## 2019-06-20 ENCOUNTER — Ambulatory Visit (INDEPENDENT_AMBULATORY_CARE_PROVIDER_SITE_OTHER): Payer: BC Managed Care – PPO | Admitting: Podiatry

## 2019-06-20 ENCOUNTER — Other Ambulatory Visit: Payer: Self-pay

## 2019-06-20 DIAGNOSIS — S93692D Other sprain of left foot, subsequent encounter: Secondary | ICD-10-CM

## 2019-06-20 DIAGNOSIS — Z9889 Other specified postprocedural states: Secondary | ICD-10-CM

## 2019-06-20 DIAGNOSIS — M722 Plantar fascial fibromatosis: Secondary | ICD-10-CM

## 2019-06-22 NOTE — Progress Notes (Signed)
   Subjective:  Patient presents today status post endoscopic plantar fasciotomy left. DOS: 04/28/2019. He states he is improving. He reports some continued pain but has been increasing his daily activities. He has been doing stretches daily as instructed. He has been taking Percocet for pain. Patient is here for further evaluation and treatment.    Past Medical History:  Diagnosis Date  . Whooping cough       Objective/Physical Exam Neurovascular status intact.  Skin incisions appear to be well. No sign of infectious process noted. No dehiscence. No active bleeding noted. Moderate edema noted to the surgical extremity.  Assessment: 1. s/p endoscopic plantar fasciotomy left. DOS: 04/28/2019   Plan of Care:  1. Patient was evaluated.  2. OTC insoles provided.  3. Continue to slowly increase activity.  4. Continue taking Percocet 5/325 mg as needed.  5. Short term disability ends in one month.  6. Return to clinic in 4 weeks for final follow up visit.      Felecia Shelling, DPM [facility]  Dr. Felecia Shelling, DPM    [address]                                        [place], [place] [address]                Office [phone]  Fax [phone]

## 2019-07-04 ENCOUNTER — Other Ambulatory Visit: Payer: Self-pay | Admitting: Podiatry

## 2019-07-05 MED ORDER — OXYCODONE-ACETAMINOPHEN 5-325 MG PO TABS: 1.0000 | ORAL_TABLET | Freq: Three times a day (TID) | ORAL | 0 refills | Status: DC | PRN

## 2019-07-25 ENCOUNTER — Ambulatory Visit (INDEPENDENT_AMBULATORY_CARE_PROVIDER_SITE_OTHER): Payer: BC Managed Care – PPO | Admitting: Podiatry

## 2019-07-25 ENCOUNTER — Other Ambulatory Visit: Payer: Self-pay

## 2019-07-25 ENCOUNTER — Encounter: Payer: Self-pay | Admitting: Podiatry

## 2019-07-25 DIAGNOSIS — Z9889 Other specified postprocedural states: Secondary | ICD-10-CM

## 2019-07-25 DIAGNOSIS — M722 Plantar fascial fibromatosis: Secondary | ICD-10-CM

## 2019-07-25 DIAGNOSIS — S93692D Other sprain of left foot, subsequent encounter: Secondary | ICD-10-CM

## 2019-07-25 MED ORDER — OXYCODONE-ACETAMINOPHEN 5-325 MG PO TABS: 1.0000 | ORAL_TABLET | Freq: Three times a day (TID) | ORAL | 0 refills | Status: DC | PRN

## 2019-07-25 MED ORDER — DICLOFENAC SODIUM 75 MG PO TBEC: 75.0000 mg | DELAYED_RELEASE_TABLET | Freq: Two times a day (BID) | ORAL | 1 refills | Status: DC

## 2019-07-25 NOTE — Progress Notes (Signed)
   Subjective:  Patient presents today status post endoscopic plantar fasciotomy left. DOS: 04/28/2019.  Patient states that he is feeling very well especially noticing significant improvement over the past month.  He is somewhat concerned because he is going back to work full activity and they have not booked out over the next 6 weeks without any days off.  Otherwise she is feeling very well and OTC insoles are helping significantly.  He presents for further treatment and evaluation  Past Medical History:  Diagnosis Date  . Whooping cough       Objective: Physical Exam General: The patient is alert and oriented x3 in no acute distress.  Dermatology: Skin is cool, dry and supple bilateral lower extremities. Negative for open lesions or macerations.  Vascular: Palpable pedal pulses bilaterally. No edema or erythema noted. Capillary refill within normal limits.  Neurological: Epicritic and protective threshold grossly intact bilaterally.   Musculoskeletal Exam: All pedal and ankle joints range of motion within normal limits bilateral. Muscle strength 5/5 in all groups bilateral.   Assessment: 1. s/p endoscopic plantar fasciotomy left. DOS: 04/28/2019   Plan of Care:  1. Patient was evaluated.  2.  Refill prescription for Percocet 5/325 mg to be used in the evenings as the patient transitions back to work 3.  Refill prescription for diclofenac 75 mg 2 times daily 4.  Continue OTC power step insoles 5.  Patient may return to work full activity no restrictions 6.  Return to clinic as needed  Felecia Shelling, DPM [facility]  Dr. Felecia Shelling, DPM    [address]                                        [place], [place] [address]                Office [phone]  Fax [phone]

## 2019-08-12 ENCOUNTER — Other Ambulatory Visit: Payer: Self-pay | Admitting: Podiatry

## 2020-02-25 ENCOUNTER — Telehealth: Payer: BC Managed Care – PPO | Admitting: Nurse Practitioner

## 2020-02-25 DIAGNOSIS — R0602 Shortness of breath: Secondary | ICD-10-CM

## 2020-02-25 DIAGNOSIS — U071 COVID-19: Secondary | ICD-10-CM

## 2020-02-25 DIAGNOSIS — R52 Pain, unspecified: Secondary | ICD-10-CM

## 2020-02-25 DIAGNOSIS — R059 Cough, unspecified: Secondary | ICD-10-CM

## 2020-02-25 DIAGNOSIS — R0981 Nasal congestion: Secondary | ICD-10-CM

## 2020-02-25 MED ORDER — ALBUTEROL SULFATE HFA 108 (90 BASE) MCG/ACT IN AERS: 2.0000 | INHALATION_SPRAY | Freq: Four times a day (QID) | RESPIRATORY_TRACT | 0 refills | Status: DC | PRN

## 2020-02-25 MED ORDER — BENZONATATE 100 MG PO CAPS: 100.0000 mg | ORAL_CAPSULE | Freq: Three times a day (TID) | ORAL | 0 refills | Status: DC | PRN

## 2020-02-25 MED ORDER — FLUTICASONE PROPIONATE 50 MCG/ACT NA SUSP: 2.0000 | Freq: Every day | NASAL | 6 refills | Status: DC

## 2020-02-25 MED ORDER — NAPROXEN 500 MG PO TABS: 500.0000 mg | ORAL_TABLET | Freq: Two times a day (BID) | ORAL | 0 refills | Status: DC

## 2020-02-25 NOTE — Progress Notes (Signed)
E-Visit for Corona Virus Screening  We are sorry you are not feeling well. We are here to help!  You have tested positive for COVID-19, meaning that you were infected with the novel coronavirus and could give the virus to others.  It is vitally important that you stay home so you do not spread it to others.      Please continue isolation at home, for at least 10 days since the start of your symptoms and until you have had 24 hours with no fever (without taking a fever reducer) and with improving of symptoms.  If you have no symptoms but tested positive (or all symptoms resolve after 5 days and you have no fever) you can leave your house but continue to wear a mask around others for an additional 5 days. If you have a fever,continue to stay home until you have had 24 hours of no fever. Most cases improve 5-10 days from onset but we have seen a small number of patients who have gotten worse after the 10 days.  Please be sure to watch for worsening symptoms and remain taking the proper precautions.   Go to the nearest hospital ED for assessment if fever/cough/breathlessness are severe or illness seems like a threat to life.    The following symptoms may appear 2-14 days after exposure: . Fever . Cough . Shortness of breath or difficulty breathing . Chills . Repeated shaking with chills . Muscle pain . Headache . Sore throat . New loss of taste or smell . Fatigue . Congestion or runny nose . Nausea or vomiting . Diarrhea  You have been enrolled in [facility] Monitoring for COVID-19. Daily you will receive a questionnaire within the MyChart website. Our COVID-19 response team will be monitoring your responses daily.  You can use medication such as A prescription cough medication called Tessalon Perles 100 mg. You may take 1-2 capsules every 8 hours as needed for cough, A prescription inhaler called Albuterol MDI 90 mcg /actuation 2 puffs every 4 hours as needed for shortness of breath,  wheezing, cough, A prescription anti-inflammatory called Naprosyn 500 mg. Take twice daily as needed for fever or body aches for 2 weeks and A prescription for Fluticasone nasal spray 2 sprays in each nostril one time per day  You have tested positive for Covid but because you are not considered high risk you do not qualify for monoclonal antibody infusion.  Supportive care is all that is needed.   You may also take acetaminophen (Tylenol) as needed for fever.  HOME CARE: . Only take medications as instructed by your medical team. . Drink plenty of fluids and get plenty of rest. . A steam or ultrasonic humidifier can help if you have congestion.   GET HELP RIGHT AWAY IF YOU HAVE EMERGENCY WARNING SIGNS.  Call 911 or proceed to your closest emergency facility if: . You develop worsening high fever. . Trouble breathing . Bluish lips or face . Persistent pain or pressure in the chest . New confusion . Inability to wake or stay awake . You cough up blood. . Your symptoms become more severe . Inability to hold down food or fluids  This list is not all possible symptoms. Contact your medical provider for any symptoms that are severe or concerning to you.  5-10 minutes spent reviewing and documenting in chart.   Your e-visit answers were reviewed by a board certified advanced clinical practitioner to complete your personal care plan.  Depending on the  condition, your plan could have included both over the counter or prescription medications.  If there is a problem please reply once you have received a response from your provider.  Your safety is important to [place].  If you have drug allergies check your prescription carefully.    You can use MyChart to ask questions about today's visit, request a non-urgent call back, or ask for a work or school excuse for 24 hours related to this e-Visit. If it has been greater than 24 hours you will need to follow up with your provider, or enter a new  e-Visit to address those concerns. You will get an e-mail in the next two days asking about your experience.  I hope that your e-visit has been valuable and will speed your recovery. Thank you for using e-visits.

## 2020-02-27 ENCOUNTER — Telehealth: Payer: BC Managed Care – PPO | Admitting: Physician Assistant

## 2020-02-27 DIAGNOSIS — U071 COVID-19: Secondary | ICD-10-CM

## 2020-02-27 NOTE — Progress Notes (Signed)
  E-Visit for [employer] Virus Screening  Based on what you have shared with me, you need to seek an evaluation for a severe illness that is causing your symptoms which may be coronavirus. Giving worsening of breathing. I recommend that you be seen and evaluated "face to face". If you are considered high risk for Corona virus because of a known exposure, fever, shortness of breath and cough, OR if you have severe symptoms of any kind, seek medical care at an emergency room. Our Emergency Departments are best equipped to handle patients with severe symptoms.  You will be evaluated by the ER provider (or higher level of care provider) who will determine whether you need formal testing.  If you are having a true medical emergency please call 911.   I recommend the following:  . [facility] Emergency Department [address] [place], [place], [place] [address] [phone]  . [facility] Emergency Department [address] [place], [place] [address] [phone]  . [facility] Emergency Department [address] [place], [place], [place] [address] [phone]  . [facility] Emergency Department [address] [place], [place], [place] [address] [phone]  . [facility] Emergency Department [address] [place], [place], [place] [address] [phone]  NOTE: If you entered your credit card information for this eVisit, you will not be charged. You may see a "hold" on your card for the $35 but that hold will drop off and you will not have a charge processed.   Your e-visit answers were reviewed by a board certified advanced clinical practitioner to complete your personal care plan.  Thank you for using e-Visits.

## 2022-08-20 ENCOUNTER — Emergency Department (HOSPITAL_BASED_OUTPATIENT_CLINIC_OR_DEPARTMENT_OTHER)
Admission: EM | Admit: 2022-08-20 | Discharge: 2022-08-20 | Disposition: A | Discharge status: Home / Self Care | Payer: Worker's Compensation | Attending: Emergency Medicine | Admitting: Emergency Medicine

## 2022-08-20 ENCOUNTER — Emergency Department (HOSPITAL_BASED_OUTPATIENT_CLINIC_OR_DEPARTMENT_OTHER): Payer: Medicaid Other

## 2022-08-20 ENCOUNTER — Other Ambulatory Visit: Payer: Self-pay

## 2022-08-20 DIAGNOSIS — S61214A Laceration without foreign body of right ring finger without damage to nail, initial encounter: Secondary | ICD-10-CM | POA: Diagnosis not present

## 2022-08-20 DIAGNOSIS — X18XXXA Contact with other hot metals, initial encounter: Secondary | ICD-10-CM | POA: Insufficient documentation

## 2022-08-20 DIAGNOSIS — S6991XA Unspecified injury of right wrist, hand and finger(s), initial encounter: Secondary | ICD-10-CM | POA: Diagnosis present

## 2022-08-20 DIAGNOSIS — Y990 Civilian activity done for income or pay: Secondary | ICD-10-CM | POA: Diagnosis not present

## 2022-08-20 MED ORDER — LIDOCAINE-EPINEPHRINE-TETRACAINE (LET) TOPICAL GEL
3.0000 mL | Freq: Once | TOPICAL | Status: AC
  Administered 2022-08-20: 3 mL via TOPICAL
  Filled 2022-08-20: qty 3

## 2022-08-20 MED ORDER — OXYCODONE-ACETAMINOPHEN 5-325 MG PO TABS: 1.0000 | ORAL_TABLET | Freq: Four times a day (QID) | ORAL | 0 refills | Status: DC | PRN

## 2022-08-20 MED ORDER — CEPHALEXIN 500 MG PO CAPS: 500.0000 mg | ORAL_CAPSULE | Freq: Two times a day (BID) | ORAL | 0 refills | Status: AC

## 2022-08-20 MED ORDER — OXYCODONE-ACETAMINOPHEN 5-325 MG PO TABS
2.0000 | ORAL_TABLET | Freq: Once | ORAL | Status: AC
  Administered 2022-08-20: 2 via ORAL
  Filled 2022-08-20: qty 2

## 2022-08-20 MED ORDER — LIDOCAINE HCL 2 % IJ SOLN
5.0000 mL | Freq: Once | INTRAMUSCULAR | Status: AC
  Administered 2022-08-20: 100 mg
  Filled 2022-08-20: qty 20

## 2022-08-20 NOTE — ED Notes (Signed)
 RN reviewed discharge instructions with pt. Pt verbalized understanding and had no further questions. VSS upon discharge.  

## 2022-08-20 NOTE — ED Triage Notes (Signed)
Cut right ring finger with piece of metal at work. Crescent laceration ~2-3 cm circumference. Tetanus in past 5 years. Cap refill < 3 sec.

## 2022-08-20 NOTE — Discharge Instructions (Signed)
You were seen in the emergency department for a laceration.  Laceration repair was performed with placement of 6 sutures.  The sutures to remain in place for the next 7 to 10 days.  He can have these removed here in the emergency department with your primary care provider at an urgent care.  I have sent a prescription for Keflex to your pharmacy to reduce the risk of infection.  Pain medicine was also sent to the pharmacy.  Return to the emergency department if you have any concerns of possible infection in this finger.

## 2022-08-20 NOTE — ED Provider Notes (Signed)
[place] EMERGENCY DEPARTMENT AT [facility] Provider Note   CSN: [id number] Arrival date & time: 08/20/22  1812     History Chief Complaint  Patient presents with   Laceration    [name] is a 32 y.o. male.  Patient presents to the emergency department complaints of laceration.  Reports he sustained a laceration while he was at work earlier today when a piece of metal hit his finger.  Believes he is up-to-date on his Tdap with last dose approximately within the last 5 years.  Denies any significant bleeding.  Not on blood thinners.   Laceration      Home Medications Prior to Admission medications   Medication Sig Start Date End Date Taking? Authorizing Provider  cephALEXin (KEFLEX) 500 MG capsule Take 1 capsule (500 mg total) by mouth 2 (two) times daily for 5 days. 08/20/22 08/25/22 Yes Smitty Knudsen, PA-C  oxyCODONE-acetaminophen (PERCOCET/ROXICET) 5-325 MG tablet Take 1 tablet by mouth every 6 (six) hours as needed for severe pain. 08/20/22  Yes Smitty Knudsen, PA-C  albuterol (VENTOLIN HFA) 108 (90 Base) MCG/ACT inhaler Inhale 2 puffs into the lungs every 6 (six) hours as needed for wheezing or shortness of breath. 02/25/20   Daphine Deutscher, Mary-Margaret, FNP  amphetamine-dextroamphetamine (ADDERALL XR) 15 MG 24 hr capsule Take by mouth every morning. 06/18/19   [provider]  benzonatate (TESSALON PERLES) 100 MG capsule Take 1 capsule (100 mg total) by mouth 3 (three) times daily as needed. 02/25/20   Daphine Deutscher, Mary-Margaret, FNP  diclofenac (VOLTAREN) 75 MG EC tablet Take 1 tablet (75 mg total) by mouth 2 (two) times daily. 07/25/19   Felecia Shelling, DPM  fluticasone (FLONASE) 50 MCG/ACT nasal spray Place 2 sprays into both nostrils daily. 02/25/20   Daphine Deutscher Mary-Margaret, FNP  naproxen (NAPROSYN) 500 MG tablet Take 1 tablet (500 mg total) by mouth 2 (two) times daily with a meal. 02/25/20   Daphine Deutscher, Mary-Margaret, FNP  oxyCODONE-acetaminophen (PERCOCET) 5-325 MG  tablet Take 1 tablet by mouth every 8 (eight) hours as needed for severe pain. 07/25/19   Felecia Shelling, DPM      Allergies    Patient has no known allergies.    Review of Systems   Review of Systems  Skin:  Positive for wound.  All other systems reviewed and are negative.   Physical Exam Updated Vital Signs BP 118/80   Pulse 78   Temp 98 F (36.7 C) (Oral)   Resp 17   SpO2 99%  Physical Exam Vitals and nursing note reviewed.  HENT:     Head: Normocephalic and atraumatic.  Eyes:     General: No scleral icterus.       Right eye: No discharge.        Left eye: No discharge.  Cardiovascular:     Rate and Rhythm: Normal rate and regular rhythm.  Skin:    General: Skin is warm and dry.     Findings: Lesion present. No rash.     Comments: 3cm laceration noted over the right ring finger. Minimal bleeding and no obvious violation of deeper tissues.     ED Results / Procedures / Treatments   Labs (all labs ordered are listed, but only abnormal results are displayed) Labs Reviewed - No data to display  EKG None  Radiology DG Finger Ring Right  Result Date: 08/20/2022 CLINICAL DATA:  laceration EXAM: RIGHT RING FINGER 2+V COMPARISON:  None Available. FINDINGS: There is no evidence of fracture  or dislocation. There is no evidence of arthropathy or other focal bone abnormality. Soft tissue laceration along the distal right ring finger. No radiopaque foreign body. IMPRESSION: Soft tissue laceration along the distal right ring finger. No radiopaque foreign body. No acute fracture or dislocation. Electronically Signed   By: Lorenza Cambridge M.D.   On: 08/20/2022 19:00    Procedures .Marland KitchenLaceration Repair  Date/Time: 08/20/2022 11:35 PM  Performed by: Smitty Knudsen, PA-C Authorized by: Smitty Knudsen, PA-C   Consent:    Consent obtained:  Verbal   Consent given by:  Patient   Risks, benefits, and alternatives were discussed: yes     Risks discussed:  Infection, pain and poor  cosmetic result Anesthesia:    Anesthesia method:  Topical application and local infiltration   Topical anesthetic:  LET   Local anesthetic:  Lidocaine 2% w/o epi Laceration details:    Location:  Finger   Finger location:  R ring finger   Length (cm):  3   Depth (mm):  2 Pre-procedure details:    Preparation:  Imaging obtained to evaluate for foreign bodies Exploration:    Limited defect created (wound extended): yes     Hemostasis achieved with:  LET   Imaging obtained: x-ray     Imaging outcome: foreign body not noted     Contaminated: no   Treatment:    Area cleansed with:  Chlorhexidine and saline   Amount of cleaning:  Extensive   Irrigation solution:  Sterile saline   Irrigation volume:  1000cc   Irrigation method:  Pressure wash   Visualized foreign bodies/material removed: no     Debridement:  Minimal Skin repair:    Repair method:  Sutures   Suture size:  4-0   Suture material:  Nylon   Suture technique:  Simple interrupted   Number of sutures:  6 Approximation:    Approximation:  Close Repair type:    Repair type:  Simple Post-procedure details:    Dressing:  Sterile dressing   Procedure completion:  Tolerated    Medications Ordered in ED Medications  lidocaine-EPINEPHrine-tetracaine (LET) topical gel (3 mLs Topical Given 08/20/22 2029)  oxyCODONE-acetaminophen (PERCOCET/ROXICET) 5-325 MG per tablet 2 tablet (2 tablets Oral Given 08/20/22 2042)  lidocaine (XYLOCAINE) 2 % (with pres) injection 100 mg (100 mg Infiltration Given 08/20/22 2211)    ED Course/ Medical Decision Making/ A&P                           Medical Decision Making Amount and/or Complexity of Data Reviewed Radiology: ordered.  Risk Prescription drug management.   Problem List / ED Course:  Patient presented to the emergency department following a laceration to the right ring finger.  He reports that he was hit by a piece of metal which caused the skin on the knuckle to the left FPL  backwards.  He reports that he is up-to-date on his Tdap.  Has had some difficulty getting bleeding to stop prior to arriving in the emergency.  Pressure dressing was placed in triage which appears to have stopped patient's bleeding.  X-ray imaging was ordered for evaluation of any possible retained foreign objects in the wound site. X-ray negative for any foreign objects.  Will perform suture repair with 4-0 nylon and advised patient he should have these removed in the next 7 to 10 days assuming adequate wound healing.  Given dirty source of laceration, will treat with Keflex to  reduce the risk of infection in this area.  Patient tolerated suture repair without any difficulty. Patient discharged home in good condition. All questions answered prior to patient discharge.  Final Clinical Impression(s) / ED Diagnoses Final diagnoses:  Laceration of right ring finger without foreign body without damage to nail, initial encounter    Rx / DC Orders ED Discharge Orders          Ordered    oxyCODONE-acetaminophen (PERCOCET/ROXICET) 5-325 MG tablet  Every 6 hours PRN        08/20/22 2208    cephALEXin (KEFLEX) 500 MG capsule  2 times daily        08/20/22 2208              Smitty Knudsen, PA-C 08/20/22 2342    Sloan Leiter, DO 08/22/22 1514

## 2022-09-26 ENCOUNTER — Encounter (HOSPITAL_BASED_OUTPATIENT_CLINIC_OR_DEPARTMENT_OTHER): Payer: Self-pay | Admitting: Emergency Medicine

## 2022-09-26 ENCOUNTER — Other Ambulatory Visit: Payer: Self-pay

## 2022-09-26 ENCOUNTER — Emergency Department (HOSPITAL_BASED_OUTPATIENT_CLINIC_OR_DEPARTMENT_OTHER)
Admission: EM | Admit: 2022-09-26 | Discharge: 2022-09-26 | Disposition: A | Discharge status: Home / Self Care | Payer: Medicaid Other | Attending: Emergency Medicine | Admitting: Emergency Medicine

## 2022-09-26 DIAGNOSIS — S0502XA Injury of conjunctiva and corneal abrasion without foreign body, left eye, initial encounter: Secondary | ICD-10-CM | POA: Insufficient documentation

## 2022-09-26 DIAGNOSIS — W448XXA Other foreign body entering into or through a natural orifice, initial encounter: Secondary | ICD-10-CM | POA: Insufficient documentation

## 2022-09-26 DIAGNOSIS — S00252A Superficial foreign body of left eyelid and periocular area, initial encounter: Secondary | ICD-10-CM | POA: Insufficient documentation

## 2022-09-26 MED ORDER — KETOROLAC TROMETHAMINE 0.5 % OP SOLN: 1.0000 [drp] | Freq: Four times a day (QID) | OPHTHALMIC | 0 refills | Status: DC

## 2022-09-26 MED ORDER — TETRACAINE HCL 0.5 % OP SOLN
2.0000 [drp] | Freq: Once | OPHTHALMIC | Status: AC
  Administered 2022-09-26: 2 [drp] via OPHTHALMIC
  Filled 2022-09-26: qty 4

## 2022-09-26 MED ORDER — FLUORESCEIN SODIUM 1 MG OP STRP
1.0000 | ORAL_STRIP | Freq: Once | OPHTHALMIC | Status: AC
  Administered 2022-09-26: 1 via OPHTHALMIC
  Filled 2022-09-26: qty 1

## 2022-09-26 MED ORDER — CIPROFLOXACIN HCL 0.3 % OP SOLN
1.0000 [drp] | Freq: Once | OPHTHALMIC | Status: AC
  Administered 2022-09-26: 1 [drp] via OPHTHALMIC
  Filled 2022-09-26: qty 2.5

## 2022-09-26 NOTE — Discharge Instructions (Addendum)
There was a small piece of dirt noted under the left eyelid which caused a small scratch on your cornea.  Please read the attached information regarding supportive care for your eye discomfort at home.  Continue to wear dark sunglasses and avoid bright light while your eye scratch is healing.  I have ordered Cipro drops.  You should put 2 drops in the left eye every 6 hours (4 times a day) for the next 5 days.  Your eye should begin feeling better in about 36 to 48 hours. If you are continuing to have severe eye discomfort despite this treatment you should follow closely with Dr. Genia Del in the office early next week.  Contact a health care provider if: You keep having eye pain and other symptoms for more than 2-3 days. You have new symptoms, such as more redness, tearing, or fluid (discharge) coming from your eye. You have swollen eyelids. Your vision gets much worse. You have discharge that makes your eyelids stick together in the morning. Your eye patch becomes so loose that you can blink your eye. Symptoms come back after your abrasion heals.

## 2022-09-26 NOTE — ED Notes (Signed)
Patient verbalizes understanding of discharge instructions. Opportunity for questioning and answers were provided. Patient discharged from ED.  °

## 2022-09-26 NOTE — ED Notes (Signed)
Pt endorses RT eye blindness

## 2022-09-26 NOTE — ED Provider Notes (Signed)
[place] EMERGENCY DEPARTMENT AT [facility] Provider Note   CSN: [id number] Arrival date & time: 09/26/22  2542     History  Chief Complaint  Patient presents with   Foreign Body in Eye    [name] is a 32 y.o. male who presents emergency department chief complaint of foreign body sensation in the left eye.  He has a past medical history of blindness in the right eye and is only able to see light and shadows.  Patient also is a Geneticist, molecular.  He was home from work where he is vigilant about wearing both an arc shield and safety glasses.  He was playing on the floor with his child when he suddenly felt like something blew into his left eye.  He states that he blinks several times trying to get it out and nothing would make it better.  He flushed his eye with tap water for about 45 minutes with no improvement and eventually decided to go to sleep.  When he woke up this morning his symptoms were worse.  He tried to go to his work but was unable to tolerate light and had tearing and continued foreign body sensation on the left side of his eye.  He denies changes in vision.   Foreign Body in Eye       Home Medications Prior to Admission medications   Medication Sig Start Date End Date Taking? Authorizing Provider  albuterol (VENTOLIN HFA) 108 (90 Base) MCG/ACT inhaler Inhale 2 puffs into the lungs every 6 (six) hours as needed for wheezing or shortness of breath. 02/25/20   Daphine Deutscher, Mary-Margaret, FNP  amphetamine-dextroamphetamine (ADDERALL XR) 15 MG 24 hr capsule Take by mouth every morning. 06/18/19   [provider]  benzonatate (TESSALON PERLES) 100 MG capsule Take 1 capsule (100 mg total) by mouth 3 (three) times daily as needed. 02/25/20   Daphine Deutscher, Mary-Margaret, FNP  diclofenac (VOLTAREN) 75 MG EC tablet Take 1 tablet (75 mg total) by mouth 2 (two) times daily. 07/25/19   Felecia Shelling, DPM  fluticasone (FLONASE) 50 MCG/ACT nasal spray Place 2 sprays into  both nostrils daily. 02/25/20   Daphine Deutscher Mary-Margaret, FNP  naproxen (NAPROSYN) 500 MG tablet Take 1 tablet (500 mg total) by mouth 2 (two) times daily with a meal. 02/25/20   Daphine Deutscher, Mary-Margaret, FNP  oxyCODONE-acetaminophen (PERCOCET) 5-325 MG tablet Take 1 tablet by mouth every 8 (eight) hours as needed for severe pain. 07/25/19   Felecia Shelling, DPM  oxyCODONE-acetaminophen (PERCOCET/ROXICET) 5-325 MG tablet Take 1 tablet by mouth every 6 (six) hours as needed for severe pain. 08/20/22   Smitty Knudsen, PA-C      Allergies    Patient has no known allergies.    Review of Systems   Review of Systems  Physical Exam Updated Vital Signs BP 131/70   Pulse 85   Temp 98.3 F (36.8 C) (Oral)   Resp 15   Ht 6\' 3"  (1.905 m)   Wt 117.9 kg   SpO2 98%   BMI 32.50 kg/m  Physical Exam Vitals and nursing note reviewed.  Constitutional:      General: He is not in acute distress.    Appearance: He is well-developed. He is not diaphoretic.  HENT:     Head: Normocephalic and atraumatic.  Eyes:     General: Vision grossly intact. No scleral icterus.    Conjunctiva/sclera: Conjunctivae normal.     Pupils: Pupils are equal, round, and reactive to light.  Left eye: Fluorescein uptake present. Seidel exam negative.    Comments: Left everted.  Small piece of dirt noted on the left side of the eye removed with cotton swab.  Small amount of fluorescein uptake on the left upper portion of the cornea in the same area where the dirt was sitting under the lid.  This is consistent with abrasion.  Cardiovascular:     Rate and Rhythm: Normal rate and regular rhythm.     Heart sounds: Normal heart sounds.  Pulmonary:     Effort: Pulmonary effort is normal. No respiratory distress.     Breath sounds: Normal breath sounds.  Abdominal:     Palpations: Abdomen is soft.     Tenderness: There is no abdominal tenderness.  Musculoskeletal:     Cervical back: Normal range of motion and neck supple.  Skin:     General: Skin is warm and dry.  Neurological:     Mental Status: He is alert.  Psychiatric:        Behavior: Behavior normal.     ED Results / Procedures / Treatments   Labs (all labs ordered are listed, but only abnormal results are displayed) Labs Reviewed - No data to display  EKG None  Radiology No results found.  Procedures Procedures    Medications Ordered in ED Medications  tetracaine (PONTOCAINE) 0.5 % ophthalmic solution 2 drop (has no administration in time range)  fluorescein ophthalmic strip 1 strip (has no administration in time range)    ED Course/ Medical Decision Making/ A&P                                 Medical Decision Making Risk Prescription drug management.   Patient here with left eye foreign body.  Small piece of dirt noted.  No rust rings or embedded material in the eye.  His story is not consistent with metal flake injury. I discussed the case with Dr. Genia Del who recommends Cipro eyedrops in the left eye, 2 drops every 6 hours for 5 days.  If symptoms are not improved by the end of the weekend he may follow-up in the office early next week. Patient has no evidence of traumatic eye injury otherwise.       Final Clinical Impression(s) / ED Diagnoses Final diagnoses:  None    Rx / DC Orders ED Discharge Orders     None         Arthor Captain, PA-C 09/26/22 1056    Melene Plan, DO 09/26/22 1059

## 2022-09-26 NOTE — ED Triage Notes (Signed)
Pt arrives to ED with c/o possible piece of metal in left eye vs scratch that started causing pain yesterday.

## 2022-09-26 NOTE — ED Notes (Signed)
ED Provider at bedside. 

## 2022-12-22 ENCOUNTER — Ambulatory Visit: Payer: Medicaid Other | Admitting: Podiatry

## 2023-02-17 ENCOUNTER — Other Ambulatory Visit: Payer: Self-pay

## 2023-02-17 ENCOUNTER — Emergency Department (HOSPITAL_BASED_OUTPATIENT_CLINIC_OR_DEPARTMENT_OTHER): Payer: BC Managed Care – PPO

## 2023-02-17 ENCOUNTER — Encounter (HOSPITAL_BASED_OUTPATIENT_CLINIC_OR_DEPARTMENT_OTHER): Payer: Self-pay | Admitting: Emergency Medicine

## 2023-02-17 ENCOUNTER — Emergency Department (HOSPITAL_BASED_OUTPATIENT_CLINIC_OR_DEPARTMENT_OTHER)
Admission: EM | Admit: 2023-02-17 | Discharge: 2023-02-17 | Disposition: A | Discharge status: Home / Self Care | Payer: BC Managed Care – PPO | Attending: Emergency Medicine | Admitting: Emergency Medicine

## 2023-02-17 ENCOUNTER — Other Ambulatory Visit (HOSPITAL_BASED_OUTPATIENT_CLINIC_OR_DEPARTMENT_OTHER): Payer: Self-pay

## 2023-02-17 DIAGNOSIS — R197 Diarrhea, unspecified: Secondary | ICD-10-CM | POA: Insufficient documentation

## 2023-02-17 DIAGNOSIS — J210 Acute bronchiolitis due to respiratory syncytial virus: Secondary | ICD-10-CM

## 2023-02-17 DIAGNOSIS — R109 Unspecified abdominal pain: Secondary | ICD-10-CM | POA: Diagnosis not present

## 2023-02-17 DIAGNOSIS — R7401 Elevation of levels of liver transaminase levels: Secondary | ICD-10-CM | POA: Diagnosis not present

## 2023-02-17 DIAGNOSIS — R112 Nausea with vomiting, unspecified: Secondary | ICD-10-CM | POA: Diagnosis present

## 2023-02-17 DIAGNOSIS — B974 Respiratory syncytial virus as the cause of diseases classified elsewhere: Secondary | ICD-10-CM | POA: Diagnosis not present

## 2023-02-17 DIAGNOSIS — Z20822 Contact with and (suspected) exposure to covid-19: Secondary | ICD-10-CM | POA: Insufficient documentation

## 2023-02-17 LAB — CBC WITH DIFFERENTIAL/PLATELET
Abs Immature Granulocytes: 0.04 10*3/uL (ref 0.00–0.07)
Basophils Absolute: 0 10*3/uL (ref 0.0–0.1)
Basophils Relative: 0 %
Eosinophils Absolute: 0.1 10*3/uL (ref 0.0–0.5)
Eosinophils Relative: 1 %
HCT: 49.8 % (ref 39.0–52.0)
Hemoglobin: 17.6 g/dL — ABNORMAL HIGH (ref 13.0–17.0)
Immature Granulocytes: 0 %
Lymphocytes Relative: 22 %
Lymphs Abs: 3.1 10*3/uL (ref 0.7–4.0)
MCH: 31.3 pg (ref 26.0–34.0)
MCHC: 35.3 g/dL (ref 30.0–36.0)
MCV: 88.6 fL (ref 80.0–100.0)
Monocytes Absolute: 1.2 10*3/uL — ABNORMAL HIGH (ref 0.1–1.0)
Monocytes Relative: 9 %
Neutro Abs: 9.7 10*3/uL — ABNORMAL HIGH (ref 1.7–7.7)
Neutrophils Relative %: 68 %
Platelets: 338 10*3/uL (ref 150–400)
RBC: 5.62 MIL/uL (ref 4.22–5.81)
RDW: 12.8 % (ref 11.5–15.5)
WBC: 14.3 10*3/uL — ABNORMAL HIGH (ref 4.0–10.5)
nRBC: 0 % (ref 0.0–0.2)

## 2023-02-17 LAB — URINALYSIS, ROUTINE W REFLEX MICROSCOPIC
Bacteria, UA: NONE SEEN
Bilirubin Urine: NEGATIVE
Glucose, UA: NEGATIVE mg/dL
Hgb urine dipstick: NEGATIVE
Ketones, ur: NEGATIVE mg/dL
Leukocytes,Ua: NEGATIVE
Nitrite: NEGATIVE
Protein, ur: 30 mg/dL — AB
Specific Gravity, Urine: >1.046 — ABNORMAL HIGH (ref 1.005–1.030)
pH: 6 (ref 5.0–8.0)

## 2023-02-17 LAB — COMPREHENSIVE METABOLIC PANEL
ALT: 64 U/L — ABNORMAL HIGH (ref 0–44)
AST: 47 U/L — ABNORMAL HIGH (ref 15–41)
Albumin: 5.3 g/dL — ABNORMAL HIGH (ref 3.5–5.0)
Alkaline Phosphatase: 66 U/L (ref 38–126)
Anion gap: 12 (ref 5–15)
BUN: 13 mg/dL (ref 6–20)
CO2: 28 mmol/L (ref 22–32)
Calcium: 10.3 mg/dL (ref 8.9–10.3)
Chloride: 97 mmol/L — ABNORMAL LOW (ref 98–111)
Creatinine, Ser: 0.76 mg/dL (ref 0.61–1.24)
GFR, Estimated: >60 mL/min (ref >60)
Glucose, Bld: 90 mg/dL (ref 70–99)
Potassium: 3.4 mmol/L — ABNORMAL LOW (ref 3.5–5.1)
Sodium: 137 mmol/L (ref 135–145)
Total Bilirubin: 1.1 mg/dL (ref 0.0–1.2)
Total Protein: 8.1 g/dL (ref 6.5–8.1)

## 2023-02-17 LAB — RESP PANEL BY RT-PCR (RSV, FLU A&B, COVID)  RVPGX2
Influenza A by PCR: NEGATIVE
Influenza B by PCR: NEGATIVE
Resp Syncytial Virus by PCR: POSITIVE — AB
SARS Coronavirus 2 by RT PCR: NEGATIVE

## 2023-02-17 LAB — LIPASE, BLOOD: Lipase: 14 U/L (ref 11–51)

## 2023-02-17 LAB — GROUP A STREP BY PCR: Group A Strep by PCR: NOT DETECTED

## 2023-02-17 MED ORDER — ONDANSETRON HCL 4 MG/2ML IJ SOLN
4.0000 mg | Freq: Once | INTRAMUSCULAR | Status: AC
  Administered 2023-02-17: 4 mg via INTRAVENOUS
  Filled 2023-02-17: qty 2

## 2023-02-17 MED ORDER — SODIUM CHLORIDE 0.9 % IV BOLUS
1000.0000 mL | Freq: Once | INTRAVENOUS | Status: AC
  Administered 2023-02-17: 1000 mL via INTRAVENOUS

## 2023-02-17 MED ORDER — MORPHINE SULFATE (PF) 4 MG/ML IV SOLN
4.0000 mg | Freq: Once | INTRAVENOUS | Status: AC
  Administered 2023-02-17: 4 mg via INTRAVENOUS
  Filled 2023-02-17: qty 1

## 2023-02-17 MED ORDER — IOHEXOL 300 MG/ML  SOLN
100.0000 mL | Freq: Once | INTRAMUSCULAR | Status: AC | PRN
  Administered 2023-02-17: 100 mL via INTRAVENOUS

## 2023-02-17 MED ORDER — ONDANSETRON HCL 4 MG PO TABS
4.0000 mg | ORAL_TABLET | Freq: Three times a day (TID) | ORAL | 0 refills | Status: DC | PRN
  Filled 2023-02-17: qty 10, 4d supply, fill #0

## 2023-02-17 NOTE — ED Notes (Signed)
Discharge paperwork given and verbally understood. 

## 2023-02-17 NOTE — ED Notes (Signed)
Pt aware of the need for a urine... Unable to currently provide a sample... 

## 2023-02-17 NOTE — ED Provider Notes (Cosign Needed)
[place] EMERGENCY DEPARTMENT AT [facility] Provider Note   CSN: [id number] Arrival date & time: 02/17/23  4098     History  Chief Complaint  Patient presents with   Abdominal Pain    [name] is a 33 y.o. male who presents to the emergency department with 4 days of nausea vomiting and diarrhea.  Over the last 24 hours the patient has also developed severe pain in his lower abdomen bilaterally below the umbilicus.  Patient reports watery diarrhea and innumerable episodes of vomiting.  Patient states that he was vomiting into his kitchen sink last night and got dizzy and then lost consciousness.  He slumped to the floor but did not injure himself and denies hitting his head.  His wife was standing by him.  The patient also reports inability to hold down any foods or fluids.  He denies previous surgeries to the abdomen, contacts with similar symptoms, recent foreign travel, bilious or bloody vomitus or stool.  He has no significant past medical history.  He denies any drug allergies and takes daily Adderall.   Abdominal Pain      Home Medications Prior to Admission medications   Medication Sig Start Date End Date Taking? Authorizing Provider  albuterol (VENTOLIN HFA) 108 (90 Base) MCG/ACT inhaler Inhale 2 puffs into the lungs every 6 (six) hours as needed for wheezing or shortness of breath. 02/25/20   Daphine Deutscher, Mary-Margaret, FNP  amphetamine-dextroamphetamine (ADDERALL XR) 15 MG 24 hr capsule Take by mouth every morning. 06/18/19   [provider]  benzonatate (TESSALON PERLES) 100 MG capsule Take 1 capsule (100 mg total) by mouth 3 (three) times daily as needed. 02/25/20   Daphine Deutscher, Mary-Margaret, FNP  diclofenac (VOLTAREN) 75 MG EC tablet Take 1 tablet (75 mg total) by mouth 2 (two) times daily. 07/25/19   Felecia Shelling, DPM  fluticasone (FLONASE) 50 MCG/ACT nasal spray Place 2 sprays into both nostrils daily. 02/25/20   Daphine Deutscher, Mary-Margaret, FNP  ketorolac  (ACULAR) 0.5 % ophthalmic solution Place 1 drop into both eyes every 6 (six) hours. 09/26/22   Arthor Captain, PA-C  naproxen (NAPROSYN) 500 MG tablet Take 1 tablet (500 mg total) by mouth 2 (two) times daily with a meal. 02/25/20   Daphine Deutscher, Mary-Margaret, FNP  oxyCODONE-acetaminophen (PERCOCET) 5-325 MG tablet Take 1 tablet by mouth every 8 (eight) hours as needed for severe pain. 07/25/19   Felecia Shelling, DPM  oxyCODONE-acetaminophen (PERCOCET/ROXICET) 5-325 MG tablet Take 1 tablet by mouth every 6 (six) hours as needed for severe pain. 08/20/22   Smitty Knudsen, PA-C      Allergies    Patient has no known allergies.    Review of Systems   Review of Systems  Gastrointestinal:  Positive for abdominal pain.    Physical Exam Updated Vital Signs BP (!) 129/95 (BP Location: Left Arm)   Pulse 100   Temp 98.1 F (36.7 C) (Oral)   Resp 20   Ht 6\' 3"  (1.905 m)   Wt 127 kg   SpO2 97%   BMI 35.00 kg/m  Physical Exam Vitals and nursing note reviewed.  Constitutional:      General: He is not in acute distress.    Appearance: He is well-developed. He is ill-appearing. He is not diaphoretic.  HENT:     Head: Normocephalic and atraumatic.     Mouth/Throat:     Mouth: Mucous membranes are dry.  Eyes:     General: No scleral icterus.  Conjunctiva/sclera: Conjunctivae normal.  Cardiovascular:     Rate and Rhythm: Normal rate and regular rhythm.     Heart sounds: Normal heart sounds.  Pulmonary:     Effort: Pulmonary effort is normal. No respiratory distress.     Breath sounds: Normal breath sounds.  Abdominal:     General: Bowel sounds are normal. There is no distension.     Palpations: Abdomen is soft.     Tenderness: There is abdominal tenderness in the right lower quadrant, suprapubic area and left lower quadrant. There is guarding.  Musculoskeletal:     Cervical back: Normal range of motion and neck supple.  Skin:    General: Skin is warm and dry.  Neurological:     Mental  Status: He is alert.  Psychiatric:        Behavior: Behavior normal.     ED Results / Procedures / Treatments   Labs (all labs ordered are listed, but only abnormal results are displayed) Labs Reviewed  GROUP A STREP BY PCR  RESP PANEL BY RT-PCR (RSV, FLU A&B, COVID)  [id number]  CBC WITH DIFFERENTIAL/PLATELET  COMPREHENSIVE METABOLIC PANEL  LIPASE, BLOOD  URINALYSIS, ROUTINE W REFLEX MICROSCOPIC  I-STAT CHEM 8, ED    EKG None  Radiology No results found.  Procedures Procedures    Medications Ordered in ED Medications  morphine (PF) 4 MG/ML injection 4 mg (has no administration in time range)  ondansetron (ZOFRAN) injection 4 mg (has no administration in time range)  sodium chloride 0.9 % bolus 1,000 mL (has no administration in time range)    ED Course/ Medical Decision Making/ A&P Clinical Course as of 02/20/23 2320  Tue Feb 17, 2023  1133 WBC(!): 14.3 [AH]  1133 ALT(!): 64 [AH]  1133 AST(!): 47 [AH]  1133 Albumin(!): 5.3 [AH]  1133 Respiratory Syncytial Virus by PCR(!): POSITIVE [AH]    Clinical Course User Index [AH] Arthor Captain, PA-C                                 Medical Decision Making Amount and/or Complexity of Data Reviewed Labs: ordered. Decision-making details documented in ED Course. Radiology: ordered.  Risk Prescription drug management.   This patient presents to the ED with chief complaint(s) of abd pain, n/v/d with no pertinent past medical history.    The differential diagnosis for generalized abdominal pain includes, but is not limited to AAA, gastroenteritis, appendicitis, Bowel obstruction, Bowel perforation. Gastroparesis, DKA, Hernia, Inflammatory bowel disease, mesenteric ischemia, pancreatitis, peritonitis SBP, volvulus.  Differential diagnosis also includes abdominal mass, colitis, diverticulitis.  Less likely Crohn's disease.   The initial plan is to order labs, imaging, and to treat patient with pain medications, antiemetics  and fluid resuscitation for domino pain and dehydration  Additional Tests and treatment considered:    Reassessment and review (also see workup area): Lab Tests: I Ordered, and personally interpreted labs.  The pertinent results include: CBC 14, urine is without significant abnormality.  Potassium 3.4, insignificant, mildly elevated AST and ALT also insignificant.  Imaging Studies: I ordered and independently visualized and interpreted the following imaging CT scan abdomen pelvis   which showed no acute findings The interpretation of the imaging was limited to assessing for emergent pathology, for which purpose it was ordered.   Medicines ordered and prescription drug management: I ordered the following medications . Medications  morphine (PF) 4 MG/ML injection 4 mg (4 mg Intravenous Given  02/17/23 1026)  ondansetron (ZOFRAN) injection 4 mg (4 mg Intravenous Given 02/17/23 1024)  sodium chloride 0.9 % bolus 1,000 mL (0 mLs Intravenous Stopped 02/17/23 1115)  iohexol (OMNIPAQUE) 300 MG/ML solution 100 mL (100 mLs Intravenous Contrast Given 02/17/23 1115)    for n/v/d    Reevaluation of the patient after these medicines showed that the patient    improved  Social Determinants of Health: SDOH Screenings   Tobacco Use: Medium Risk (02/17/2023)     Complexity of problems addressed: Patient's presentation is most consistent with  acute illness/injury with systemic symptoms During patient's assessment  Disposition: After consideration of the diagnostic results and the patient's response to treatment,  I feel that the patent would benefit from discharge           Final Clinical Impression(s) / ED Diagnoses Final diagnoses:  Nausea vomiting and diarrhea  RSV (acute bronchiolitis due to respiratory syncytial virus)    Rx / DC Orders ED Discharge Orders     None         Arthor Captain, PA-C 02/21/23 0001

## 2023-02-17 NOTE — ED Notes (Signed)
Blood culture sent to be held at the lab.

## 2023-02-17 NOTE — Discharge Instructions (Addendum)
Contact a health care provider if you: Cannot keep fluids down. Have symptoms that get worse. Have new symptoms. Feel light-headed or dizzy. Have muscle cramps. Get help right away if you: Have chest pain. Have trouble breathing or you are breathing very quickly. Have a fast heartbeat. Feel extremely weak or you faint. Have a severe headache, a stiff neck, or both. Have a rash. Have severe pain, cramping, or bloating in your abdomen. Have skin that feels cold and clammy. Feel confused. Have pain when you urinate. Have signs of dehydration, such as: Dark urine, very little urine, or no urine. Cracked lips. Dry mouth. Sunken eyes. Sleepiness. Weakness. Have signs of bleeding, such as: Seeing blood in your vomit. Having vomit that looks like coffee grounds. Having bloody or black stools or stools that look like tar. These symptoms may be an emergency. Get help right away. Call 911. Do not wait to see if the symptoms will go away. Do not drive yourself to the hospital. 

## 2023-02-17 NOTE — ED Triage Notes (Signed)
Reports n/v since Saturday. Had syncopal episode last night. Denies hitting head. C/o lower abd pain.

## 2023-04-27 DIAGNOSIS — F902 Attention-deficit hyperactivity disorder, combined type: Secondary | ICD-10-CM | POA: Diagnosis not present

## 2023-05-23 ENCOUNTER — Encounter: Payer: Self-pay | Admitting: Podiatry

## 2023-05-23 ENCOUNTER — Ambulatory Visit (INDEPENDENT_AMBULATORY_CARE_PROVIDER_SITE_OTHER): Admitting: Podiatry

## 2023-05-23 ENCOUNTER — Ambulatory Visit (INDEPENDENT_AMBULATORY_CARE_PROVIDER_SITE_OTHER)

## 2023-05-23 DIAGNOSIS — M722 Plantar fascial fibromatosis: Secondary | ICD-10-CM

## 2023-05-23 MED ORDER — METHYLPREDNISOLONE 4 MG PO TBPK: ORAL_TABLET | ORAL | 0 refills | Status: DC

## 2023-05-23 MED ORDER — BETAMETHASONE SOD PHOS & ACET 6 (3-3) MG/ML IJ SUSP
3.0000 mg | Freq: Once | INTRAMUSCULAR | Status: AC
  Administered 2023-05-23: 3 mg via INTRA_ARTICULAR

## 2023-05-23 MED ORDER — MELOXICAM 15 MG PO TABS: 15.0000 mg | ORAL_TABLET | Freq: Every day | ORAL | 1 refills | Status: DC

## 2023-05-23 NOTE — Progress Notes (Signed)
   Chief Complaint  Patient presents with   Foot Pain    "I think I tore my Plantar Fascia on my right foot this time." N - plantar fascia pain L - plantar heel D - about 1 yr. O - suddenly, intensified past 6 mos C - sharp pain, tender, sore, swelling A - when get up in the morning T - Tylenol , Ibuprofen, roll it on 2" pipe     Subjective: 33 y.o. male    Past Medical History:  Diagnosis Date   Whooping cough      Objective: Physical Exam General: The patient is alert and oriented x3 in no acute distress.  Dermatology: Skin is warm, dry and supple bilateral lower extremities. Negative for open lesions or macerations bilateral.   Vascular: Dorsalis Pedis and Posterior Tibial pulses palpable bilateral.  Capillary fill time is immediate to all digits.  Neurological: Epicritic and protective threshold intact bilateral.   Musculoskeletal: Tenderness to palpation to the plantar aspect of the right heel along the plantar fascia. All other joints range of motion within normal limits bilateral. Strength 5/5 in all groups bilateral.   Radiographic exam: Normal osseous mineralization. Joint spaces preserved. No fracture/dislocation/boney destruction. No other soft tissue abnormalities or radiopaque foreign bodies.   Assessment: 1.  Chronic plantar fasciitis right 2.  H/o EPF LT. DOS: 04/28/2019  Plan of Care:  -Patient evaluated.  X-rays reviewed -Injection of 0.5 cc Celestone  Soluspan injected in the plantar fascia right -Prescription for Medrol Dosepak -Prescription for meloxicam  15 mg daily after completion of the Dosepak -Recommend OTC arch supports available at [employer] running store -Unfortunately the patient has had pain and tenderness to the right heel now for over 1 year.  He has a long history of plantar fasciitis bilateral ultimately leading to endoscopic plantar fasciotomy of the left heel in 2021.  The patient has tried multiple conservative modalities at home  with no lasting alleviation of his symptoms.  He is very interested in pursuing surgery now to the right plantar fascia since he had such good success with the left. -Surgery was discussed again in detail with the patient.  Risk benefits advantages and disadvantages of the procedure were explained in detail.  No guarantees were expressed or implied.  All patient questions were answered.  After discussion with the patient he would like to pursue surgery to the contralateral limb. -Authorization for surgery was initiated today.  Surgery will consist of endoscopic plantar fasciotomy right.  He would like to have surgery in about 2 months since his wife is just starting a new job they would like to get settled before he takes time off of work for the postoperative recovery. -Return to clinic 1 week postop  *[place]   Dot Gazella, DPM [facility]  Dr. Dot Gazella, DPM    [address] [place], [place] [address]                Office [phone]  Fax [phone]

## 2023-05-25 ENCOUNTER — Ambulatory Visit: Admitting: Podiatry

## 2023-05-26 ENCOUNTER — Encounter: Payer: Self-pay | Admitting: Podiatry

## 2023-05-29 ENCOUNTER — Other Ambulatory Visit: Payer: Self-pay | Admitting: Podiatry

## 2023-05-29 MED ORDER — HYDROCODONE-ACETAMINOPHEN 5-325 MG PO TABS: 1.0000 | ORAL_TABLET | Freq: Four times a day (QID) | ORAL | 0 refills | Status: DC | PRN

## 2023-06-02 NOTE — Telephone Encounter (Signed)
 lft mess on pt's vmail with my fax# and email address to send his STD forms

## 2023-06-03 ENCOUNTER — Other Ambulatory Visit: Payer: Self-pay | Admitting: Podiatry

## 2023-06-03 ENCOUNTER — Encounter: Payer: Self-pay | Admitting: Podiatry

## 2023-06-03 MED ORDER — OXYCODONE-ACETAMINOPHEN 5-325 MG PO TABS: 1.0000 | ORAL_TABLET | Freq: Four times a day (QID) | ORAL | 0 refills | Status: DC | PRN

## 2023-06-03 NOTE — Progress Notes (Signed)
 PRN acute severe heel pain.   Dot Gazella, DPM [facility]  Dr. Dot Gazella, DPM    [address] [place], [place] [address]                Office [phone]  Fax [phone]

## 2023-06-04 DIAGNOSIS — Z0271 Encounter for disability determination: Secondary | ICD-10-CM

## 2023-06-04 NOTE — Telephone Encounter (Signed)
 Called and left mess on vmail for pt and advised him I had his STD forms from [facility] and I had some questions for him.

## 2023-06-04 NOTE — Telephone Encounter (Signed)
 called pt back and he will do intermittent leave 2-3 per week 10hrs per episode until surgery effective 06/04/23-07/08/23.DOS 07/09/23 and adv will be revised to continuous after surgery. He will drop off his portion of forms to be faxed all togehter by Monday

## 2023-06-04 NOTE — Telephone Encounter (Signed)
 Pt bought his portion of forms to office. I gave him a copy of all Faxed [employer] [phone]

## 2023-06-07 ENCOUNTER — Other Ambulatory Visit: Payer: Self-pay | Admitting: Podiatry

## 2023-06-08 MED ORDER — OXYCODONE-ACETAMINOPHEN 5-325 MG PO TABS: 1.0000 | ORAL_TABLET | Freq: Four times a day (QID) | ORAL | 0 refills | Status: DC | PRN

## 2023-06-09 ENCOUNTER — Ambulatory Visit
Admission: EM | Admit: 2023-06-09 | Discharge: 2023-06-09 | Disposition: A | Discharge status: Home / Self Care | Attending: Emergency Medicine | Admitting: Emergency Medicine

## 2023-06-09 DIAGNOSIS — S51811A Laceration without foreign body of right forearm, initial encounter: Secondary | ICD-10-CM

## 2023-06-09 MED ORDER — LIDOCAINE-EPINEPHRINE-TETRACAINE (LET) TOPICAL GEL
3.0000 mL | Freq: Once | TOPICAL | Status: AC
  Administered 2023-06-09: 3 mL via TOPICAL

## 2023-06-09 NOTE — Discharge Instructions (Signed)
 I recommend antibiotic ointment twice daily Change dressing at least once daily You can wash normally with mild soap and water, just do not soak in water!  Monitor for any signs of infection - increased pain, redness, swelling, or foul drainage. Please return if needed  Return in 7-10 days to have sutures removed

## 2023-06-09 NOTE — ED Triage Notes (Signed)
 Patient presents to [facility] for laceration to right forearm today at 1530. States he cut it with metal, last Tdap 2024.

## 2023-06-09 NOTE — ED Provider Notes (Signed)
 [facility]    CSN: [id number] Arrival date & time: 06/09/23  1903      History   Chief Complaint Chief Complaint  Patient presents with   Laceration    HPI Esther Sellars is a 33 y.o. male.  Right forearm laceration occurred this afternoon.  Cut on a metal tube.  He wrapped it and continued working.  When he went home noticed it was still bleeding.  Pain rated 5/10. Last tetanus was a year ago  Past Medical History:  Diagnosis Date   Whooping cough     There are no active problems to display for this patient.   History reviewed. No pertinent surgical history.     Home Medications    Prior to Admission medications   Medication Sig Start Date End Date Taking? Authorizing Provider  albuterol  (VENTOLIN  HFA) 108 (90 Base) MCG/ACT inhaler Inhale 2 puffs into the lungs every 6 (six) hours as needed for wheezing or shortness of breath. 02/25/20   Gaylyn Keas, Mary-Margaret, FNP  amphetamine-dextroamphetamine (ADDERALL XR) 15 MG 24 hr capsule Take by mouth every morning. 06/18/19   [provider]  benzonatate  (TESSALON  PERLES) 100 MG capsule Take 1 capsule (100 mg total) by mouth 3 (three) times daily as needed. 02/25/20   Gaylyn Keas Mary-Margaret, FNP  diclofenac  (VOLTAREN ) 75 MG EC tablet Take 1 tablet (75 mg total) by mouth 2 (two) times daily. 07/25/19   Dot Gazella, DPM  fluticasone  (FLONASE ) 50 MCG/ACT nasal spray Place 2 sprays into both nostrils daily. 02/25/20   Gaylyn Keas, Mary-Margaret, FNP  ketorolac  (ACULAR ) 0.5 % ophthalmic solution Place 1 drop into both eyes every 6 (six) hours. 09/26/22   Harris, Abigail, PA-C  meloxicam  (MOBIC ) 15 MG tablet Take 1 tablet (15 mg total) by mouth daily. 05/23/23 09/20/23  Dot Gazella, DPM  methylPREDNISolone  (MEDROL  DOSEPAK) 4 MG TBPK tablet 6 day dose pack - take as directed 05/23/23   Evans, Brent M, DPM  naproxen  (NAPROSYN ) 500 MG tablet Take 1 tablet (500 mg total) by mouth 2 (two) times daily with a meal. 02/25/20    Gaylyn Keas, Mary-Margaret, FNP  ondansetron  (ZOFRAN ) 4 MG tablet Take 1 tablet (4 mg total) by mouth every 8 (eight) hours as needed for nausea or vomiting. 02/17/23   Harris, Abigail, PA-C  oxyCODONE -acetaminophen  (PERCOCET) 5-325 MG tablet Take 1 tablet by mouth every 6 (six) hours as needed for severe pain (pain score 7-10). 06/08/23   Dot Gazella, DPM    Family History Family History  Problem Relation Age of Onset   Hypertension Mother     Social History Social History   Tobacco Use   Smoking status: Every Day    Types: E-cigarettes   Smokeless tobacco: Never  Vaping Use   Vaping status: Every Day  Substance Use Topics   Alcohol use: No    Alcohol/week: 0.0 standard drinks of alcohol   Drug use: No     Allergies   Patient has no known allergies.   Review of Systems Review of Systems Per HPI  Physical Exam Triage Vital Signs ED Triage Vitals [06/09/23 1911]  Encounter Vitals Group     BP (!) 149/83     Systolic BP Percentile      Diastolic BP Percentile      Pulse Rate 67     Resp 16     Temp 98.3 F (36.8 C)     Temp Source Oral     SpO2 95 %  Weight      Height      Head Circumference      Peak Flow      Pain Score 5     Pain Loc      Pain Education      Exclude from Growth Chart    No data found.  Updated Vital Signs BP (!) 149/83 (BP Location: Right Arm)   Pulse 67   Temp 98.3 F (36.8 C) (Oral)   Resp 16   SpO2 95%   Visual Acuity Right Eye Distance:   Left Eye Distance:   Bilateral Distance:    Right Eye Near:   Left Eye Near:    Bilateral Near:     Physical Exam Vitals and nursing note reviewed.  Constitutional:      General: He is not in acute distress.    Appearance: Normal appearance.  HENT:     Mouth/Throat:     Pharynx: Oropharynx is clear.  Cardiovascular:     Rate and Rhythm: Normal rate and regular rhythm.     Heart sounds: Normal heart sounds.  Pulmonary:     Effort: Pulmonary effort is normal.     Breath  sounds: Normal breath sounds.  Abdominal:     Palpations: Abdomen is soft.  Musculoskeletal:     Comments: Full ROM at elbow and wrist. Distal sensation intact, cap refill < 2 seconds. Radial pulse 2+  Skin:    Findings: Laceration present.          Comments: Laceration on the right inner forearm. About 7 cm long. The proximal portion branches off into an abrasion. Bleeding controlled. Wound is shallow but not approximated   Neurological:     Mental Status: He is alert and oriented to person, place, and time.     UC Treatments / Results  Labs (all labs ordered are listed, but only abnormal results are displayed) Labs Reviewed - No data to display  EKG   Radiology No results found.  Procedures Laceration Repair  Date/Time: 06/09/2023 8:28 PM  Performed by: Creighton Doffing, PA-C Authorized by: Creighton Doffing, PA-C   Consent:    Consent obtained:  Verbal   Consent given by:  Patient   Risks, benefits, and alternatives were discussed: yes     Risks discussed:  Infection, pain, retained foreign body, need for additional repair, poor cosmetic result and poor wound healing Universal protocol:    Procedure explained and questions answered to patient or proxy's satisfaction: yes     Patient identity confirmed:  Verbally with patient Anesthesia:    Anesthesia method:  Topical application and local infiltration   Topical anesthetic:  LET   Local anesthetic:  Lidocaine  1% WITH epi Laceration details:    Location:  Shoulder/arm   Shoulder/arm location:  L lower arm   Length (cm):  7   Depth (mm):  0.2 Pre-procedure details:    Preparation:  Patient was prepped and draped in usual sterile fashion Exploration:    Hemostasis achieved with:  Direct pressure   Imaging outcome: foreign body not noted     Wound exploration: entire depth of wound visualized   Treatment:    Area cleansed with:  Shur-Clens and saline   Amount of cleaning:  Standard Skin repair:    Repair method:   Sutures   Suture size:  4-0   Suture material:  Prolene   Suture technique:  Simple interrupted   Number of sutures:  7 Approximation:    Approximation:  Close Repair type:    Repair type:  Simple Post-procedure details:    Dressing:  Open (no dressing)   Procedure completion:  Tolerated  (including critical care time)  Medications Ordered in UC Medications  lidocaine -EPINEPHrine -tetracaine  (LET) topical gel (3 mLs Topical Given 06/09/23 1935)    Initial Impression / Assessment and Plan / UC Course  I have reviewed the triage vital signs and the nursing notes.  Pertinent labs & imaging results that were available during my care of the patient were reviewed by me and considered in my medical decision making (see chart for details).  Wound cleansed by RN, and LET gel applied Further anesthetic as per procedure note, and 7 sutures placed for closure. Wound care and pain control discussed, signs of infection to monitor for, return precautions. He will return in 7 to 10 days for removal of sutures Patient agrees to plan, no questions  Final Clinical Impressions(s) / UC Diagnoses   Final diagnoses:  Laceration of right forearm, initial encounter     Discharge Instructions      I recommend antibiotic ointment twice daily Change dressing at least once daily You can wash normally with mild soap and water, just do not soak in water!  Monitor for any signs of infection - increased pain, redness, swelling, or foul drainage. Please return if needed  Return in 7-10 days to have sutures removed    ED Prescriptions   None    PDMP not reviewed this encounter.   Loisann Roach, Beth Brooke 06/09/23 2031

## 2023-06-15 ENCOUNTER — Telehealth: Payer: Self-pay | Admitting: Podiatry

## 2023-06-15 ENCOUNTER — Encounter: Payer: Self-pay | Admitting: Podiatry

## 2023-06-15 NOTE — Telephone Encounter (Signed)
 Patient calling asking about the cortizone shots how often you can get them and how long they work. Patient states he is having surgery next month with Dr Luster Salters and he states they said no pain medication but pt states he is in a lot of pain he needs to do something.

## 2023-06-16 ENCOUNTER — Other Ambulatory Visit (HOSPITAL_BASED_OUTPATIENT_CLINIC_OR_DEPARTMENT_OTHER): Payer: Self-pay

## 2023-06-16 ENCOUNTER — Emergency Department (HOSPITAL_BASED_OUTPATIENT_CLINIC_OR_DEPARTMENT_OTHER)
Admission: EM | Admit: 2023-06-16 | Discharge: 2023-06-16 | Disposition: A | Discharge status: Home / Self Care | Attending: Emergency Medicine | Admitting: Emergency Medicine

## 2023-06-16 ENCOUNTER — Other Ambulatory Visit: Payer: Self-pay

## 2023-06-16 ENCOUNTER — Encounter (HOSPITAL_BASED_OUTPATIENT_CLINIC_OR_DEPARTMENT_OTHER): Payer: Self-pay | Admitting: Emergency Medicine

## 2023-06-16 DIAGNOSIS — M722 Plantar fascial fibromatosis: Secondary | ICD-10-CM | POA: Diagnosis not present

## 2023-06-16 DIAGNOSIS — M79671 Pain in right foot: Secondary | ICD-10-CM | POA: Diagnosis present

## 2023-06-16 MED ORDER — KETOROLAC TROMETHAMINE 10 MG PO TABS
10.0000 mg | ORAL_TABLET | Freq: Four times a day (QID) | ORAL | 0 refills | Status: DC | PRN
  Filled 2023-06-16: qty 20, 5d supply, fill #0

## 2023-06-16 MED ORDER — KETOROLAC TROMETHAMINE 15 MG/ML IJ SOLN: 15.0000 mg | Freq: Once | INTRAMUSCULAR | Status: DC

## 2023-06-16 MED ORDER — KETOROLAC TROMETHAMINE 15 MG/ML IJ SOLN
15.0000 mg | Freq: Once | INTRAMUSCULAR | Status: AC
  Administered 2023-06-16: 15 mg via INTRAMUSCULAR
  Filled 2023-06-16: qty 1

## 2023-06-16 NOTE — ED Triage Notes (Signed)
 Pt c/o RT foot pain, plantar fascia pain "for awhile". Pt states UC refused to see pt

## 2023-06-16 NOTE — Discharge Instructions (Addendum)
 You were seen for increased pain caused by plantar fasciitis.  With you scheduled for surgery in 3 weeks, we will have you continue to do conservative management provided to you by podiatry.  However with meloxicam  being  ineffective, I am prescribing oral Toradol  for you to use for pain relief.  This is not to be taken with any other anti-inflammatory medication including indomethacin, ibuprofen, and meloxicam  as they can concurrently cause stomach ulcers, bleeding, kidney damage.  Use the Toradol  only in cases of extreme pain as needed, can take it every 6 hours.  Otherwise continue using current pain regimen.  Recommend continue to ice the area to decrease inflammation and do exercises of which I have attached to this after is a summary to help with pain management.  You also need to coordinate with your work to help with activity restrictions, resting your foot as much as possible.  Please be sure to follow-up with podiatry for further pain relief and management.

## 2023-06-16 NOTE — ED Provider Notes (Signed)
 [place] [facility] AT [facility] Provider Note   CSN: [id number] Arrival date & time: 06/16/23  4098     History  Chief Complaint  Patient presents with   Foot Pain    [name] is a 33 y.o. male.   Foot Pain  Patient is a 33 year old male presents the ED today with complaints of right foot pain, seen by podiatry for plantar fasciitis and scheduled for surgery in 3 weeks, currently taking meloxicam  and using arch support and had previously been given cortisone shot and Medrol  Dosepak.  Notes that conservative measures have not worked for him at this time.  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  Denies any new injury, fever, lower extremity swelling, numbness, weakness, tingling.     Home Medications Prior to Admission medications   Medication Sig Start Date End Date Taking? Authorizing Provider  ketorolac  (TORADOL ) 10 MG tablet Take 1 tablet (10 mg total) by mouth every 6 (six) hours as needed. 06/16/23  Yes Efstathios Sawin S, PA-C  albuterol  (VENTOLIN  HFA) 108 (90 Base) MCG/ACT inhaler Inhale 2 puffs into the lungs every 6 (six) hours as needed for wheezing or shortness of breath. 02/25/20   Gaylyn Keas, Mary-Margaret, FNP  amphetamine-dextroamphetamine (ADDERALL XR) 15 MG 24 hr capsule Take by mouth every morning. 06/18/19   [provider]  benzonatate  (TESSALON  PERLES) 100 MG capsule Take 1 capsule (100 mg total) by mouth 3 (three) times daily as needed. 02/25/20   Gaylyn Keas Mary-Margaret, FNP  diclofenac  (VOLTAREN ) 75 MG EC tablet Take 1 tablet (75 mg total) by mouth 2 (two) times daily. 07/25/19   Dot Gazella, DPM  fluticasone  (FLONASE ) 50 MCG/ACT nasal spray Place 2 sprays into both nostrils daily. 02/25/20   Gaylyn Keas, Mary-Margaret, FNP  ketorolac  (ACULAR ) 0.5 % ophthalmic solution Place 1 drop into both eyes every 6 (six)  hours. 09/26/22   Harris, Abigail, PA-C  meloxicam  (MOBIC ) 15 MG tablet Take 1 tablet (15 mg total) by mouth daily. 05/23/23 09/20/23  Dot Gazella, DPM  methylPREDNISolone  (MEDROL  DOSEPAK) 4 MG TBPK tablet 6 day dose pack - take as directed 05/23/23   Evans, Brent M, DPM  naproxen  (NAPROSYN ) 500 MG tablet Take 1 tablet (500 mg total) by mouth 2 (two) times daily with a meal. 02/25/20   Gaylyn Keas, Mary-Margaret, FNP  ondansetron  (ZOFRAN ) 4 MG tablet Take 1 tablet (4 mg total) by mouth every 8 (eight) hours as needed for nausea or vomiting. 02/17/23   Harris, Abigail, PA-C  oxyCODONE -acetaminophen  (PERCOCET) 5-325 MG tablet Take 1 tablet by mouth every 6 (six) hours as needed for severe pain (pain score 7-10). 06/08/23   Dot Gazella, DPM      Allergies    Patient has no known allergies.    Review of Systems   Review of Systems  Musculoskeletal:  Positive for myalgias.  All other systems reviewed and are negative.   Physical Exam Updated Vital Signs BP 126/78 (BP Location: Right Arm)   Pulse 69   Temp 98.4 F (36.9 C)   Resp 16   SpO2 98%  Physical Exam Vitals and nursing note reviewed.  Constitutional:      General: He is not in acute distress.    Appearance: Normal appearance. He is not ill-appearing or diaphoretic.  HENT:     Head: Normocephalic and atraumatic.  Eyes:  General: No scleral icterus.       Right eye: No discharge.        Left eye: No discharge.     Extraocular Movements: Extraocular movements intact.     Conjunctiva/sclera: Conjunctivae normal.  Cardiovascular:     Rate and Rhythm: Normal rate and regular rhythm.     Pulses: Normal pulses.     Heart sounds: Normal heart sounds. No murmur heard.    No friction rub. No gallop.  Pulmonary:     Effort: Pulmonary effort is normal. No respiratory distress.     Breath sounds: Normal breath sounds.  Abdominal:     General: Abdomen is flat.     Palpations: Abdomen is soft.     Tenderness: There is no abdominal  tenderness.  Musculoskeletal:        General: Tenderness (Tenderness noted to the plantar region of right foot) present. No swelling, deformity or signs of injury. Normal range of motion.     Right lower leg: No edema.     Left lower leg: No edema.  Skin:    General: Skin is warm and dry.     Coloration: Skin is not jaundiced.     Findings: No bruising, erythema or lesion.  Neurological:     General: No focal deficit present.     Mental Status: He is alert and oriented to person, place, and time. Mental status is at baseline.     Sensory: No sensory deficit.     Motor: No weakness.  Psychiatric:        Mood and Affect: Mood normal.     ED Results / Procedures / Treatments   Labs (all labs ordered are listed, but only abnormal results are displayed) Labs Reviewed - No data to display  EKG None  Radiology No results found.  Procedures Procedures    Medications Ordered in ED Medications  ketorolac  (TORADOL ) 15 MG/ML injection 15 mg (15 mg Intramuscular Given 06/16/23 1138)    ED Course/ Medical Decision Making/ A&P                                 Medical Decision Making  This patient is a 33 year old male who presents to the ED for concern of right foot pain, being seen by the podiatry for plantar fasciitis.  Surgery in 3 weeks.  Contacted podiatry but has not yet gotten their response for further pain management.  Currently taking meloxicam  and using ice, foot inserts.  Still working without activity restrictions.  On physical exam, patient is in no acute distress, afebrile, alert and orient x 4, speaking in full sentences, nontachypneic, nontachycardic.  Right foot without signs of swelling, erythema, deformity.  Normal ROM.  Tenderness noted to the plantar aspect of right foot.  Unremarkable physical exam otherwise.  With patient current pain regimen being unable to manage pain, will prescribe Toradol , as patient has already used steroids and current anti-inflammatory  medication ineffective.  Will have him stop all other NSAIDs when using Toradol  and to only save Toradol  for extreme, unmanageable pain.  Will have him continue to follow-up with podiatry for further pain management and will also have him continue to use exercises for plantar fasciitis and conservative treatments.  Patient vital signs have remained stable throughout the course of patient's time in the ED. Low suspicion for any other emergent pathology at this time. I believe this patient is safe to be  discharged. Provided strict return to ER precautions. Patient expressed agreement and understanding of plan. All questions were answered.  Differential diagnoses prior to evaluation: The emergent differential diagnosis includes, but is not limited to, plantar fasciitis, fracture, gout, pseudogout, ligamentous injury, dislocation. This is not an exhaustive differential.   Past Medical History / Co-morbidities / Social History: Reports a past medical history of surgery for plantar fasciitis of left foot  Additional history: Chart reviewed. Pertinent results include:  Noted to have messaged podiatry yesterday, reporting a cortisone shot but still reporting extensive pain.  Scheduled appointment with podiatry in 1 week  Seen on 05/23/2023 by podiatry for plantar fasciitis of the right foot.  Noting it to be present for about 1 year, intensifying with the last 6 months described as sharp, tender, sore, worse in the morning, using Tylenol  and ibuprofen rolling it with a 2 inch pipe regularly.  Provided a Celestone  since the planned injection and prescribed Medrol  Dosepak and meloxicam  15 mg daily, provided arch supports. Noted to have tried multiple conservative modalities without alleviation.  Wanting to pursue surgery.  Surgery planned.  Lab Tests/Imaging studies: No tests or imaging are warranted at this time his pain is reportedly same as previous plantr fasciitis.     Medications: I ordered  medication including Toradol .  I have reviewed the patients home medicines and have made adjustments as needed.    Disposition: After consideration of the diagnostic results and the patients response to treatment, I feel that the patient would benefit from discharge and treatment as above.   emergency department workup does not suggest an emergent condition requiring admission or immediate intervention beyond what has been performed at this time. The plan is: Toradol  for unmanageable pain only, continue with measures provided by podiatry for pain relief, follow-up with podiatry for further pain relief management and continue with currently planned surgery. The patient is safe for discharge and has been instructed to return immediately for worsening symptoms, change in symptoms or any other concerns.   Final Clinical Impression(s) / ED Diagnoses Final diagnoses:  Plantar fasciitis of right foot    Rx / DC Orders ED Discharge Orders          Ordered    ketorolac  (TORADOL ) 10 MG tablet  Every 6 hours PRN        06/16/23 1135              Hayes Lipps, PA-C 06/16/23 1141    Sueellen Emery, MD 06/16/23 [phone]

## 2023-06-16 NOTE — ED Notes (Signed)
 Discharge paperwork given and verbally understood.

## 2023-06-18 ENCOUNTER — Encounter: Payer: Self-pay | Admitting: Podiatry

## 2023-06-18 NOTE — Telephone Encounter (Signed)
 Pt emailed and sent MyChart message he is in a lot of pain and wants note to be out of work prior to surgery 07/09/23. Dr. Luster Salters is ok with note OOW 06/22/23-08/28/23. Pt had asked if can get surgery sooner. I will fax [facility] and email pt note of leave dates.

## 2023-06-24 ENCOUNTER — Encounter: Payer: Self-pay | Admitting: Podiatry

## 2023-06-24 ENCOUNTER — Ambulatory Visit (INDEPENDENT_AMBULATORY_CARE_PROVIDER_SITE_OTHER): Admitting: Podiatry

## 2023-06-24 ENCOUNTER — Telehealth: Payer: Self-pay | Admitting: Podiatry

## 2023-06-24 DIAGNOSIS — M722 Plantar fascial fibromatosis: Secondary | ICD-10-CM | POA: Diagnosis not present

## 2023-06-24 MED ORDER — OXYCODONE-ACETAMINOPHEN 5-325 MG PO TABS: 1.0000 | ORAL_TABLET | Freq: Four times a day (QID) | ORAL | 0 refills | Status: DC | PRN

## 2023-06-24 MED ORDER — BETAMETHASONE SOD PHOS & ACET 6 (3-3) MG/ML IJ SUSP
3.0000 mg | Freq: Once | INTRAMUSCULAR | Status: AC
  Administered 2023-06-24: 3 mg via INTRA_ARTICULAR

## 2023-06-24 NOTE — Progress Notes (Signed)
   Chief Complaint  Patient presents with   Foot Pain    RM#7 Follow up right foot pain scheduled for surgery 07/09/2023 patient states is in a lot of pain.    Subjective: 33 y.o. male    Past Medical History:  Diagnosis Date   Whooping cough      Objective: Physical Exam General: The patient is alert and oriented x3 in no acute distress.  Dermatology: Skin is warm, dry and supple bilateral lower extremities. Negative for open lesions or macerations bilateral.   Vascular: Dorsalis Pedis and Posterior Tibial pulses palpable bilateral.  Capillary fill time is immediate to all digits.  Neurological: Epicritic and protective threshold intact bilateral.   Musculoskeletal: Tenderness to palpation to the plantar aspect of the right heel along the plantar fascia. All other joints range of motion within normal limits bilateral. Strength 5/5 in all groups bilateral.   Radiographic exam: Normal osseous mineralization. Joint spaces preserved. No fracture/dislocation/boney destruction. No other soft tissue abnormalities or radiopaque foreign bodies.   Assessment: 1.  Chronic plantar fasciitis right 2.  H/o EPF LT. DOS: 04/28/2019  Plan of Care:  -Patient evaluated.  -Injection of 0.5 cc Celestone  Soluspan injected in the plantar fascia right - Patient continues to have severe pain and tenderness to the right plantar heel.  He is scheduled currently for surgery on 07/09/2023.  We are going to see if we can move that up to tomorrow, 06/25/2023.  Patient states that he is ready for surgery.  He has had great success with the left endoscopic plantar fasciotomy surgery in 2021. -Authorization for surgery was initiated last visit on 05/23/2023.  Surgery will consist of endoscopic plantar fasciotomy right.   -Refill prescription for Percocet 5/325 mg every 4 hours as needed pain #20 -Return to clinic 1 week postop  *[place].  Wife, Gentles, just started a new job   Dot Gazella, DPM [facility]  Dr. Dot Gazella, DPM    [address] [place], [place] [address]                Office [phone]  Fax [phone]

## 2023-06-24 NOTE — Telephone Encounter (Signed)
 Spoke to pt made him aware we were not able to get him scheduled for tomorrow for surgery especially with all the insurance confusion.   He understood. He says he was told it was all straightened out now.   I told pt we would try to get him in next Thursday 6/5 for the surgery. Please call pt once we get auth to let him know for sure when surgery is.

## 2023-06-25 ENCOUNTER — Telehealth: Payer: Self-pay | Admitting: Podiatry

## 2023-06-25 NOTE — Telephone Encounter (Signed)
 No auth needed for the procedure for his insurance.   I called pt and left message for him to call me to confirm he wanted to move surgery to 6/5. I asked him to call me back as I have to physically speak to him to move it.

## 2023-06-25 NOTE — Telephone Encounter (Signed)
 SURGERY WAS MOVED FROM 07/09/23 TO 07/02/23.  EMAILED SURGERY CENTER

## 2023-06-25 NOTE — Telephone Encounter (Signed)
 DOS: 07/02/23  (RT) ENDOSCOPIC PLANTAR FASCIOTOMY -16109    EFFECTIVE DATE :  09/28/2022    PER JANINE H NO PRIOR AUTH IS REQ FOR CPT CODE 60454  REF# [id number]

## 2023-06-28 HISTORY — PX: FOOT SURGERY: SHX648

## 2023-07-02 ENCOUNTER — Other Ambulatory Visit: Payer: Self-pay | Admitting: Podiatry

## 2023-07-02 DIAGNOSIS — M722 Plantar fascial fibromatosis: Secondary | ICD-10-CM | POA: Diagnosis not present

## 2023-07-02 MED ORDER — OXYCODONE-ACETAMINOPHEN 5-325 MG PO TABS: 1.0000 | ORAL_TABLET | ORAL | 0 refills | Status: DC | PRN

## 2023-07-02 MED ORDER — IBUPROFEN 800 MG PO TABS: 800.0000 mg | ORAL_TABLET | Freq: Three times a day (TID) | ORAL | 1 refills | Status: DC

## 2023-07-02 NOTE — Progress Notes (Signed)
 PRN postop

## 2023-07-06 ENCOUNTER — Encounter: Payer: Self-pay | Admitting: Podiatry

## 2023-07-07 ENCOUNTER — Other Ambulatory Visit: Payer: Self-pay | Admitting: Podiatry

## 2023-07-07 MED ORDER — OXYCODONE-ACETAMINOPHEN 5-325 MG PO TABS: 1.0000 | ORAL_TABLET | ORAL | 0 refills | Status: DC | PRN

## 2023-07-07 NOTE — Progress Notes (Signed)
 PRN postop

## 2023-07-08 ENCOUNTER — Encounter: Payer: Self-pay | Admitting: Podiatry

## 2023-07-08 ENCOUNTER — Ambulatory Visit (INDEPENDENT_AMBULATORY_CARE_PROVIDER_SITE_OTHER): Admitting: Podiatry

## 2023-07-08 ENCOUNTER — Ambulatory Visit (INDEPENDENT_AMBULATORY_CARE_PROVIDER_SITE_OTHER)

## 2023-07-08 VITALS — Ht 75.0 in | Wt 280.0 lb

## 2023-07-08 DIAGNOSIS — M722 Plantar fascial fibromatosis: Secondary | ICD-10-CM

## 2023-07-08 NOTE — Progress Notes (Signed)
   Chief Complaint  Patient presents with   Routine Post Op    POV # 1 DOS 07/02/23 RT EPF, pt states foot is still sore and in some pain but he knows that is expected no other concerns.    Subjective:  Patient presents today status post EPF RT foot.  DOS: 07/02/2023.  Doing well.  Patient has noticed significant improvement since prior to surgery.  He says that the excruciating pain is no longer there and he is now experiencing standard postoperative pain.  No new complaints.  Currently WBAT CAM boot  Past Medical History:  Diagnosis Date   Whooping cough     No past surgical history on file.  No Known Allergies  Objective/Physical Exam Neurovascular status intact.  Incision well coapted with sutures intact. No sign of infectious process noted. No dehiscence. No active bleeding noted.  Moderate edema noted to the surgical extremity.  Radiographic Exam RT foot 07/08/2023:  Unchanged.  Normal osseous mineralization.  Impression: Negative  Assessment: 1. s/p EPF RT foot. DOS: 07/02/2023   Plan of Care:  -Patient was evaluated. X-rays reviewed - Continue WBAT CAM boot for an additional 2 weeks -Compression ankle sleeve dispensed.  Wear daily -Refill prescription for Percocet 05/2023 mg Q4H PRN pain was sent yesterday, 07/07/2023 -Return to clinic 1 week suture removal  Dot Gazella, DPM [facility]  Dr. Dot Gazella, DPM    [address] [place], [place] [address]                Office [phone]  Fax [phone]

## 2023-07-13 ENCOUNTER — Other Ambulatory Visit: Payer: Self-pay | Admitting: Podiatry

## 2023-07-13 MED ORDER — OXYCODONE-ACETAMINOPHEN 5-325 MG PO TABS: 1.0000 | ORAL_TABLET | ORAL | 0 refills | Status: DC | PRN

## 2023-07-13 MED ORDER — IBUPROFEN 800 MG PO TABS: 800.0000 mg | ORAL_TABLET | Freq: Four times a day (QID) | ORAL | 1 refills | Status: DC | PRN

## 2023-07-15 ENCOUNTER — Ambulatory Visit (INDEPENDENT_AMBULATORY_CARE_PROVIDER_SITE_OTHER): Admitting: Podiatry

## 2023-07-15 ENCOUNTER — Encounter: Payer: Self-pay | Admitting: Podiatry

## 2023-07-15 ENCOUNTER — Encounter: Admitting: Podiatry

## 2023-07-15 DIAGNOSIS — M722 Plantar fascial fibromatosis: Secondary | ICD-10-CM

## 2023-07-15 DIAGNOSIS — Z9889 Other specified postprocedural states: Secondary | ICD-10-CM

## 2023-07-15 NOTE — Progress Notes (Signed)
 Subjective:  Patient ID: [name], male    DOB: [birth year],  MRN: [id number]  Chief Complaint  Patient presents with   Routine Post Op    POV # 2 DOS 07/02/23 RT EPF    DOS: 07/02/23 Procedure: Right EPF  33 y.o. male returns for post-op check.  He states he is doing well pain is controlled nonweightbearing to the right lower extremity with knee scooter.  Denies any other acute complaints.  Review of Systems: Negative except as noted in the HPI. Denies N/V/F/Ch.  Past Medical History:  Diagnosis Date   Whooping cough     Current Outpatient Medications:    albuterol  (VENTOLIN  HFA) 108 (90 Base) MCG/ACT inhaler, Inhale 2 puffs into the lungs every 6 (six) hours as needed for wheezing or shortness of breath., Disp: 8 g, Rfl: 0   amphetamine-dextroamphetamine (ADDERALL XR) 15 MG 24 hr capsule, Take by mouth every morning., Disp: , Rfl:    benzonatate  (TESSALON  PERLES) 100 MG capsule, Take 1 capsule (100 mg total) by mouth 3 (three) times daily as needed., Disp: 20 capsule, Rfl: 0   diclofenac  (VOLTAREN ) 75 MG EC tablet, Take 1 tablet (75 mg total) by mouth 2 (two) times daily., Disp: 60 tablet, Rfl: 1   fluticasone  (FLONASE ) 50 MCG/ACT nasal spray, Place 2 sprays into both nostrils daily., Disp: 16 g, Rfl: 6   ibuprofen  (ADVIL ) 800 MG tablet, Take 1 tablet (800 mg total) by mouth 3 (three) times daily., Disp: 60 tablet, Rfl: 1   ibuprofen  (ADVIL ) 800 MG tablet, Take 1 tablet (800 mg total) by mouth every 6 (six) hours as needed., Disp: 60 tablet, Rfl: 1   ketorolac  (ACULAR ) 0.5 % ophthalmic solution, Place 1 drop into both eyes every 6 (six) hours., Disp: 5 mL, Rfl: 0   meloxicam  (MOBIC ) 15 MG tablet, Take 1 tablet (15 mg total) by mouth daily., Disp: 60 tablet, Rfl: 1   methylPREDNISolone  (MEDROL  DOSEPAK) 4 MG TBPK tablet, 6 day dose pack - take as directed, Disp: 21 tablet, Rfl: 0   naproxen  (NAPROSYN ) 500 MG tablet, Take 1 tablet (500 mg total) by mouth 2 (two) times daily with a  meal., Disp: 30 tablet, Rfl: 0   ondansetron  (ZOFRAN ) 4 MG tablet, Take 1 tablet (4 mg total) by mouth every 8 (eight) hours as needed for nausea or vomiting., Disp: 10 tablet, Rfl: 0   oxyCODONE -acetaminophen  (PERCOCET) 5-325 MG tablet, Take 1 tablet by mouth every 4 (four) hours as needed for severe pain (pain score 7-10)., Disp: 30 tablet, Rfl: 0   oxyCODONE -acetaminophen  (PERCOCET) 5-325 MG tablet, Take 1 tablet by mouth every 4 (four) hours as needed for severe pain (pain score 7-10)., Disp: 30 tablet, Rfl: 0  Social History   Tobacco Use  Smoking Status Every Day   Types: E-cigarettes  Smokeless Tobacco Never    No Known Allergies Objective:  There were no vitals filed for this visit. There is no height or weight on file to calculate BMI. Constitutional Well developed. Well nourished.  Vascular Foot warm and well perfused. Capillary refill normal to all digits.   Neurologic Normal speech. Oriented to person, place, and time. Epicritic sensation to light touch grossly present bilaterally.  Dermatologic Skin healing well without signs of infection. Skin edges well coapted without signs of infection.  Orthopedic: Tenderness to palpation noted about the surgical site.   Radiographs: None Assessment:   1. Plantar fasciitis, right   2. S/P foot surgery    Plan:  Patient  was evaluated and treated and all questions answered.  S/p foot surgery right -Progressing as expected post-operatively. -XR: See above -WB Status: Weightbearing as tolerated in cam boot -Sutures: Intact.  No clinical signs of dehiscence or no complication noted. -Medications: None -Foot redressed.  No follow-ups on file.

## 2023-07-15 NOTE — Telephone Encounter (Signed)
 pt lft mess on my vmail. I called back and lft on his vmail if his ins comp is needing Dr notes I can print those off for him. if they have a form they need to send me via email/fax

## 2023-07-15 NOTE — Telephone Encounter (Signed)
 cld and s/w pt [employer] need last notes/letter of DOS and type of surgery he had. I faxed to Attn Joel/[employer] [phone] and emailed pt copy for his records

## 2023-07-17 ENCOUNTER — Encounter: Payer: Self-pay | Admitting: Podiatry

## 2023-07-17 ENCOUNTER — Other Ambulatory Visit: Payer: Self-pay | Admitting: Podiatry

## 2023-07-17 DIAGNOSIS — M722 Plantar fascial fibromatosis: Secondary | ICD-10-CM

## 2023-07-17 DIAGNOSIS — Z9889 Other specified postprocedural states: Secondary | ICD-10-CM

## 2023-07-17 MED ORDER — OXYCODONE-ACETAMINOPHEN 5-325 MG PO TABS
1.0000 | ORAL_TABLET | ORAL | 0 refills | Status: DC | PRN
Start: 2023-07-17 — End: 2023-08-21

## 2023-07-17 NOTE — Progress Notes (Signed)
 Pain medication sent in for post op pain

## 2023-07-19 ENCOUNTER — Encounter: Payer: Self-pay | Admitting: Podiatry

## 2023-07-22 ENCOUNTER — Encounter: Admitting: Podiatry

## 2023-07-29 ENCOUNTER — Ambulatory Visit (INDEPENDENT_AMBULATORY_CARE_PROVIDER_SITE_OTHER): Admitting: Podiatry

## 2023-07-29 ENCOUNTER — Encounter: Payer: Self-pay | Admitting: Podiatry

## 2023-07-29 VITALS — Ht 75.0 in | Wt 280.0 lb

## 2023-07-29 DIAGNOSIS — L209 Atopic dermatitis, unspecified: Secondary | ICD-10-CM | POA: Diagnosis not present

## 2023-07-29 MED ORDER — CLOTRIMAZOLE-BETAMETHASONE 1-0.05 % EX CREA: 1.0000 | TOPICAL_CREAM | Freq: Every day | CUTANEOUS | 2 refills | Status: DC

## 2023-07-29 MED ORDER — MUPIROCIN 2 % EX OINT: 1.0000 | TOPICAL_OINTMENT | Freq: Two times a day (BID) | CUTANEOUS | 1 refills | Status: DC

## 2023-07-29 NOTE — Progress Notes (Signed)
   Chief Complaint  Patient presents with   Routine Post Op    POV # 3 DOS 07/02/23 RT, pt states his foot is ok has the occasionally pain when stretching his foot but states its not major, recently notice rash/ blisters forming on the bottom of the foot an heel states that is painful used some athletes foot cream and it burned so he stop using it.    Subjective:  Patient presents today status post EPF RT foot.  DOS: 07/02/2023.  Continuing to do well.  He has minimal pain to the plantar heel.  He has noticed over the last 2 weeks skin lesions developing along the plantar arch of the foot with pruritus.  Past Medical History:  Diagnosis Date   Whooping cough     No past surgical history on file.  No Known Allergies   RT foot 07/29/2023  Objective/Physical Exam Neurovascular status intact.  Incisions nicely healed.  Minimal tenderness with palpation along the plantar heel.  There are multiple diffuse pustular inflammatory lesions noted to the skin of the right foot.  Please see above noted photo.  Radiographic Exam RT foot 07/08/2023:  Unchanged.  Normal osseous mineralization.  Impression: Negative  Assessment: 1. s/p EPF RT foot. DOS: 07/02/2023 2.  Pustular atopic dermatitis right foot  Plan of Care:  -Patient was evaluated.  -Prescription for both Lotrisone cream as well as mupirocin 2% ointment sent to the pharmacy.  Combining a 50-50 mixture and apply twice daily -Discontinue cam boot.  Recommend good supportive tennis shoes and sneakers -Return to clinic 4 weeks  Thresa EMERSON Sar, DPM [facility]  Dr. Thresa EMERSON Sar, DPM    [address] [place], [place] [address]                Office [phone]  Fax [phone]

## 2023-08-10 ENCOUNTER — Encounter: Admitting: Podiatry

## 2023-08-17 ENCOUNTER — Ambulatory Visit (INDEPENDENT_AMBULATORY_CARE_PROVIDER_SITE_OTHER)

## 2023-08-17 ENCOUNTER — Encounter: Payer: Self-pay | Admitting: Podiatry

## 2023-08-17 ENCOUNTER — Ambulatory Visit (INDEPENDENT_AMBULATORY_CARE_PROVIDER_SITE_OTHER): Admitting: Podiatry

## 2023-08-17 VITALS — Ht 75.0 in | Wt 280.0 lb

## 2023-08-17 DIAGNOSIS — M722 Plantar fascial fibromatosis: Secondary | ICD-10-CM | POA: Diagnosis not present

## 2023-08-17 NOTE — Progress Notes (Signed)
   Chief Complaint  Patient presents with   Foot Pain    Pt is here due to right foot pain, states it started a week ago no injury to foot, bought new shoes per Dr Janit request, pain/ soreness started at the top of the foot, feels like a stabbing pain, in the mornings he can barely walk due to pain.    Subjective:  Patient presents today status post EPF RT foot.  DOS: 07/02/2023.  Patient states that since last visit he began to develop significant pain and tenderness associated to the dorsum of the right midfoot.  He has been wearing tennis shoes which have slowly increased the pain.  He says over the past week the pain has actually increased.  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  Past Medical History:  Diagnosis Date   Whooping cough     No past surgical history on file.  No Known Allergies   RT foot 07/29/2023  Objective/Physical Exam Neurovascular status intact.  The pustular lesions to the foot are completely resolved.  No open wounds noted  Today there is exquisite tenderness to palpation throughout the dorsum of the midfoot around the proximal aspect of the third metatarsal base.  No appreciable edema today.  Radiographic Exam RT foot 08/25/2023:  Unchanged.  Normal osseous mineralization.  Impression: Negative for stress fracture  Assessment: 1. s/p EPF RT foot. DOS: 07/02/2023 2.  Pustular atopic dermatitis right foot; resolved 3.  Suspicion for stress fracture right dorsal midfoot based on clinical exam  Plan of Care:  -Patient was evaluated.  X-rays reviewed today - Unfortunately patient has significant pain and tenderness to the dorsum of the right foot.  Recommend returning to the cam boot.  WBAT -Stressed the importance of reducing activity to allow the foot to rest -RICE -Continue the compression sleeve -Return to clinic next scheduled appointment  Thresa EMERSON Janit, DPM [facility]  Dr. Thresa EMERSON Janit, DPM     [address] [place], [place] [address]                Office [phone]  Fax [phone]

## 2023-08-18 ENCOUNTER — Encounter (HOSPITAL_BASED_OUTPATIENT_CLINIC_OR_DEPARTMENT_OTHER): Payer: Self-pay | Admitting: Emergency Medicine

## 2023-08-18 ENCOUNTER — Emergency Department (HOSPITAL_BASED_OUTPATIENT_CLINIC_OR_DEPARTMENT_OTHER): Admission: EM | Admit: 2023-08-18 | Discharge: 2023-08-19 | Discharge status: Against Medical Advice

## 2023-08-18 DIAGNOSIS — R1110 Vomiting, unspecified: Secondary | ICD-10-CM | POA: Diagnosis present

## 2023-08-18 DIAGNOSIS — M7918 Myalgia, other site: Secondary | ICD-10-CM | POA: Insufficient documentation

## 2023-08-18 DIAGNOSIS — Z5321 Procedure and treatment not carried out due to patient leaving prior to being seen by health care provider: Secondary | ICD-10-CM | POA: Insufficient documentation

## 2023-08-18 LAB — RESP PANEL BY RT-PCR (RSV, FLU A&B, COVID)  RVPGX2
Influenza A by PCR: NEGATIVE
Influenza B by PCR: NEGATIVE
Resp Syncytial Virus by PCR: NEGATIVE
SARS Coronavirus 2 by RT PCR: NEGATIVE

## 2023-08-18 LAB — CBC
HCT: 45.2 % (ref 39.0–52.0)
Hemoglobin: 15.7 g/dL (ref 13.0–17.0)
MCH: 31.6 pg (ref 26.0–34.0)
MCHC: 34.7 g/dL (ref 30.0–36.0)
MCV: 90.9 fL (ref 80.0–100.0)
Platelets: 270 K/uL (ref 150–400)
RBC: 4.97 MIL/uL (ref 4.22–5.81)
RDW: 12.7 % (ref 11.5–15.5)
WBC: 12.3 K/uL — ABNORMAL HIGH (ref 4.0–10.5)
nRBC: 0 % (ref 0.0–0.2)

## 2023-08-18 LAB — COMPREHENSIVE METABOLIC PANEL WITH GFR
ALT: 25 U/L (ref 0–44)
AST: 25 U/L (ref 15–41)
Albumin: 5.1 g/dL — ABNORMAL HIGH (ref 3.5–5.0)
Alkaline Phosphatase: 73 U/L (ref 38–126)
Anion gap: 15 (ref 5–15)
BUN: 11 mg/dL (ref 6–20)
CO2: 23 mmol/L (ref 22–32)
Calcium: 10.4 mg/dL — ABNORMAL HIGH (ref 8.9–10.3)
Chloride: 103 mmol/L (ref 98–111)
Creatinine, Ser: 0.91 mg/dL (ref 0.61–1.24)
GFR, Estimated: >60 mL/min (ref >60)
Glucose, Bld: 114 mg/dL — ABNORMAL HIGH (ref 70–99)
Potassium: 4.2 mmol/L (ref 3.5–5.1)
Sodium: 141 mmol/L (ref 135–145)
Total Bilirubin: 0.6 mg/dL (ref 0.0–1.2)
Total Protein: 8.1 g/dL (ref 6.5–8.1)

## 2023-08-18 LAB — LIPASE, BLOOD: Lipase: 16 U/L (ref 11–51)

## 2023-08-18 LAB — URINALYSIS, ROUTINE W REFLEX MICROSCOPIC
Bacteria, UA: NONE SEEN
Bilirubin Urine: NEGATIVE
Glucose, UA: NEGATIVE mg/dL
Hgb urine dipstick: NEGATIVE
Ketones, ur: 15 mg/dL — AB
Leukocytes,Ua: NEGATIVE
Nitrite: NEGATIVE
Protein, ur: 30 mg/dL — AB
Specific Gravity, Urine: 1.03 (ref 1.005–1.030)
pH: 5.5 (ref 5.0–8.0)

## 2023-08-18 NOTE — ED Triage Notes (Signed)
 Vomiting since 3:30 Am Feeling weak Body aches

## 2023-08-19 NOTE — ED Notes (Signed)
 Pt not found in lobby for room assignment

## 2023-08-20 ENCOUNTER — Inpatient Hospital Stay (HOSPITAL_BASED_OUTPATIENT_CLINIC_OR_DEPARTMENT_OTHER)
Admission: EM | Admit: 2023-08-20 | Discharge: 2023-08-22 | DRG: 641 | Disposition: A | Discharge status: Home / Self Care | Attending: Internal Medicine | Admitting: Internal Medicine

## 2023-08-20 ENCOUNTER — Emergency Department (HOSPITAL_BASED_OUTPATIENT_CLINIC_OR_DEPARTMENT_OTHER)

## 2023-08-20 ENCOUNTER — Other Ambulatory Visit (HOSPITAL_BASED_OUTPATIENT_CLINIC_OR_DEPARTMENT_OTHER): Payer: Self-pay

## 2023-08-20 ENCOUNTER — Encounter (HOSPITAL_BASED_OUTPATIENT_CLINIC_OR_DEPARTMENT_OTHER): Payer: Self-pay | Admitting: Emergency Medicine

## 2023-08-20 ENCOUNTER — Other Ambulatory Visit: Payer: Self-pay

## 2023-08-20 DIAGNOSIS — Z1152 Encounter for screening for COVID-19: Secondary | ICD-10-CM

## 2023-08-20 DIAGNOSIS — Z8249 Family history of ischemic heart disease and other diseases of the circulatory system: Secondary | ICD-10-CM

## 2023-08-20 DIAGNOSIS — Z6830 Body mass index (BMI) 30.0-30.9, adult: Secondary | ICD-10-CM

## 2023-08-20 DIAGNOSIS — A059 Bacterial foodborne intoxication, unspecified: Secondary | ICD-10-CM | POA: Diagnosis present

## 2023-08-20 DIAGNOSIS — E8809 Other disorders of plasma-protein metabolism, not elsewhere classified: Secondary | ICD-10-CM | POA: Diagnosis present

## 2023-08-20 DIAGNOSIS — R109 Unspecified abdominal pain: Secondary | ICD-10-CM

## 2023-08-20 DIAGNOSIS — K529 Noninfective gastroenteritis and colitis, unspecified: Secondary | ICD-10-CM | POA: Diagnosis present

## 2023-08-20 DIAGNOSIS — E876 Hypokalemia: Secondary | ICD-10-CM | POA: Diagnosis present

## 2023-08-20 DIAGNOSIS — A09 Infectious gastroenteritis and colitis, unspecified: Secondary | ICD-10-CM | POA: Diagnosis present

## 2023-08-20 DIAGNOSIS — E860 Dehydration: Principal | ICD-10-CM | POA: Diagnosis present

## 2023-08-20 DIAGNOSIS — R7401 Elevation of levels of liver transaminase levels: Secondary | ICD-10-CM | POA: Diagnosis present

## 2023-08-20 DIAGNOSIS — R112 Nausea with vomiting, unspecified: Principal | ICD-10-CM

## 2023-08-20 DIAGNOSIS — E66811 Obesity, class 1: Secondary | ICD-10-CM | POA: Diagnosis present

## 2023-08-20 DIAGNOSIS — R197 Diarrhea, unspecified: Secondary | ICD-10-CM

## 2023-08-20 DIAGNOSIS — R748 Abnormal levels of other serum enzymes: Secondary | ICD-10-CM | POA: Diagnosis present

## 2023-08-20 DIAGNOSIS — E869 Volume depletion, unspecified: Secondary | ICD-10-CM | POA: Diagnosis present

## 2023-08-20 DIAGNOSIS — F17290 Nicotine dependence, other tobacco product, uncomplicated: Secondary | ICD-10-CM | POA: Diagnosis present

## 2023-08-20 HISTORY — DX: Attention-deficit hyperactivity disorder, unspecified type: F90.9

## 2023-08-20 LAB — URINALYSIS, ROUTINE W REFLEX MICROSCOPIC
Bacteria, UA: NONE SEEN
Bilirubin Urine: NEGATIVE
Glucose, UA: NEGATIVE mg/dL
Hgb urine dipstick: NEGATIVE
Ketones, ur: NEGATIVE mg/dL
Leukocytes,Ua: NEGATIVE
Nitrite: NEGATIVE
Protein, ur: 30 mg/dL — AB
Specific Gravity, Urine: 1.034 — ABNORMAL HIGH (ref 1.005–1.030)
pH: 6 (ref 5.0–8.0)

## 2023-08-20 LAB — CBC WITH DIFFERENTIAL/PLATELET
Abs Immature Granulocytes: 0.05 K/uL (ref 0.00–0.07)
Basophils Absolute: 0 K/uL (ref 0.0–0.1)
Basophils Relative: 0 %
Eosinophils Absolute: 0 K/uL (ref 0.0–0.5)
Eosinophils Relative: 0 %
HCT: 46.7 % (ref 39.0–52.0)
Hemoglobin: 16.6 g/dL (ref 13.0–17.0)
Immature Granulocytes: 0 %
Lymphocytes Relative: 14 %
Lymphs Abs: 2 K/uL (ref 0.7–4.0)
MCH: 31.7 pg (ref 26.0–34.0)
MCHC: 35.5 g/dL (ref 30.0–36.0)
MCV: 89.3 fL (ref 80.0–100.0)
Monocytes Absolute: 1.1 K/uL — ABNORMAL HIGH (ref 0.1–1.0)
Monocytes Relative: 8 %
Neutro Abs: 11.4 K/uL — ABNORMAL HIGH (ref 1.7–7.7)
Neutrophils Relative %: 78 %
Platelets: 291 K/uL (ref 150–400)
RBC: 5.23 MIL/uL (ref 4.22–5.81)
RDW: 12.7 % (ref 11.5–15.5)
WBC: 14.6 K/uL — ABNORMAL HIGH (ref 4.0–10.5)
nRBC: 0 % (ref 0.0–0.2)

## 2023-08-20 LAB — COMPREHENSIVE METABOLIC PANEL WITH GFR
ALT: 45 U/L — ABNORMAL HIGH (ref 0–44)
AST: 42 U/L — ABNORMAL HIGH (ref 15–41)
Albumin: 5.1 g/dL — ABNORMAL HIGH (ref 3.5–5.0)
Alkaline Phosphatase: 71 U/L (ref 38–126)
Anion gap: 16 — ABNORMAL HIGH (ref 5–15)
BUN: 14 mg/dL (ref 6–20)
CO2: 24 mmol/L (ref 22–32)
Calcium: 10.7 mg/dL — ABNORMAL HIGH (ref 8.9–10.3)
Chloride: 100 mmol/L (ref 98–111)
Creatinine, Ser: 0.84 mg/dL (ref 0.61–1.24)
GFR, Estimated: >60 mL/min (ref >60)
Glucose, Bld: 111 mg/dL — ABNORMAL HIGH (ref 70–99)
Potassium: 3.4 mmol/L — ABNORMAL LOW (ref 3.5–5.1)
Sodium: 140 mmol/L (ref 135–145)
Total Bilirubin: 0.8 mg/dL (ref 0.0–1.2)
Total Protein: 8.3 g/dL — ABNORMAL HIGH (ref 6.5–8.1)

## 2023-08-20 LAB — LIPASE, BLOOD: Lipase: 18 U/L (ref 11–51)

## 2023-08-20 MED ORDER — PROMETHAZINE HCL 25 MG PO TABS
25.0000 mg | ORAL_TABLET | Freq: Four times a day (QID) | ORAL | 0 refills | Status: DC | PRN
  Filled 2023-08-20: qty 30, 8d supply, fill #0

## 2023-08-20 MED ORDER — SODIUM CHLORIDE 0.9 % IV SOLN
12.5000 mg | Freq: Once | INTRAVENOUS | Status: AC
  Administered 2023-08-20: 12.5 mg via INTRAVENOUS
  Filled 2023-08-20: qty 0.5

## 2023-08-20 MED ORDER — DIPHENHYDRAMINE HCL 50 MG/ML IJ SOLN
25.0000 mg | Freq: Every evening | INTRAMUSCULAR | Status: DC | PRN
  Administered 2023-08-20 – 2023-08-21 (×2): 25 mg via INTRAVENOUS
  Filled 2023-08-20 (×2): qty 1

## 2023-08-20 MED ORDER — MORPHINE SULFATE (PF) 4 MG/ML IV SOLN
4.0000 mg | INTRAVENOUS | Status: AC | PRN
  Administered 2023-08-20 – 2023-08-21 (×4): 4 mg via INTRAVENOUS
  Filled 2023-08-20 (×4): qty 1

## 2023-08-20 MED ORDER — TRAZODONE HCL 50 MG PO TABS: 50.0000 mg | ORAL_TABLET | Freq: Every evening | ORAL | Status: DC | PRN

## 2023-08-20 MED ORDER — SODIUM CHLORIDE 0.9 % IV BOLUS
1000.0000 mL | Freq: Once | INTRAVENOUS | Status: AC
  Administered 2023-08-20: 1000 mL via INTRAVENOUS

## 2023-08-20 MED ORDER — ENOXAPARIN SODIUM 40 MG/0.4ML IJ SOSY
40.0000 mg | PREFILLED_SYRINGE | INTRAMUSCULAR | Status: DC
  Administered 2023-08-20 – 2023-08-21 (×2): 40 mg via SUBCUTANEOUS
  Filled 2023-08-20 (×2): qty 0.4

## 2023-08-20 MED ORDER — ACETAMINOPHEN 650 MG RE SUPP: 650.0000 mg | Freq: Four times a day (QID) | RECTAL | Status: DC | PRN

## 2023-08-20 MED ORDER — ONDANSETRON HCL 4 MG/2ML IJ SOLN
4.0000 mg | Freq: Four times a day (QID) | INTRAMUSCULAR | Status: DC | PRN
  Administered 2023-08-21 (×2): 4 mg via INTRAVENOUS
  Filled 2023-08-20 (×2): qty 2

## 2023-08-20 MED ORDER — METOCLOPRAMIDE HCL 5 MG/ML IJ SOLN
10.0000 mg | Freq: Once | INTRAMUSCULAR | Status: AC
  Administered 2023-08-20: 10 mg via INTRAVENOUS
  Filled 2023-08-20: qty 2

## 2023-08-20 MED ORDER — SODIUM CHLORIDE 0.9 % IV SOLN: Freq: Once | INTRAVENOUS | Status: AC

## 2023-08-20 MED ORDER — IOHEXOL 300 MG/ML  SOLN
100.0000 mL | Freq: Once | INTRAMUSCULAR | Status: AC | PRN
  Administered 2023-08-20: 100 mL via INTRAVENOUS

## 2023-08-20 MED ORDER — METOCLOPRAMIDE HCL 5 MG/ML IJ SOLN
10.0000 mg | Freq: Four times a day (QID) | INTRAMUSCULAR | Status: DC
  Administered 2023-08-20 – 2023-08-22 (×8): 10 mg via INTRAVENOUS
  Filled 2023-08-20 (×8): qty 2

## 2023-08-20 MED ORDER — ONDANSETRON HCL 4 MG PO TABS
4.0000 mg | ORAL_TABLET | Freq: Four times a day (QID) | ORAL | Status: DC | PRN
Start: 2023-08-20 — End: 2023-08-22

## 2023-08-20 MED ORDER — DIPHENHYDRAMINE HCL 50 MG/ML IJ SOLN
12.5000 mg | Freq: Once | INTRAMUSCULAR | Status: AC
  Administered 2023-08-20: 12.5 mg via INTRAVENOUS
  Filled 2023-08-20: qty 1

## 2023-08-20 MED ORDER — POTASSIUM CHLORIDE IN NACL 20-0.9 MEQ/L-% IV SOLN
INTRAVENOUS | Status: AC
  Filled 2023-08-20 (×3): qty 1000

## 2023-08-20 MED ORDER — ACETAMINOPHEN 325 MG PO TABS
650.0000 mg | ORAL_TABLET | Freq: Four times a day (QID) | ORAL | Status: DC | PRN
  Administered 2023-08-21: 650 mg via ORAL
  Filled 2023-08-20: qty 2

## 2023-08-20 MED ORDER — FENTANYL CITRATE PF 50 MCG/ML IJ SOSY
50.0000 ug | PREFILLED_SYRINGE | Freq: Once | INTRAMUSCULAR | Status: AC
  Administered 2023-08-20: 50 ug via INTRAVENOUS
  Filled 2023-08-20: qty 1

## 2023-08-20 MED ORDER — PANTOPRAZOLE SODIUM 40 MG IV SOLR
40.0000 mg | Freq: Once | INTRAVENOUS | Status: AC
  Administered 2023-08-20: 40 mg via INTRAVENOUS
  Filled 2023-08-20: qty 10

## 2023-08-20 MED ORDER — POTASSIUM CHLORIDE 10 MEQ/100ML IV SOLN
10.0000 meq | INTRAVENOUS | Status: AC
  Administered 2023-08-20: 10 meq via INTRAVENOUS
  Filled 2023-08-20: qty 100

## 2023-08-20 MED ORDER — ONDANSETRON HCL 4 MG/2ML IJ SOLN
INTRAMUSCULAR | Status: AC
  Filled 2023-08-20: qty 4

## 2023-08-20 MED ORDER — SODIUM CHLORIDE 0.9 % IV SOLN
8.0000 mg | Freq: Once | INTRAVENOUS | Status: AC
  Administered 2023-08-20: 8 mg via INTRAVENOUS
  Filled 2023-08-20: qty 4

## 2023-08-20 MED ORDER — PROMETHAZINE HCL 25 MG/ML IJ SOLN
INTRAMUSCULAR | Status: AC
  Filled 2023-08-20: qty 1

## 2023-08-20 NOTE — ED Notes (Signed)
 Pt given saltine crackers and water to PO challenge at this time.

## 2023-08-20 NOTE — Plan of Care (Signed)

## 2023-08-20 NOTE — ED Notes (Signed)
 [facility] called for transport

## 2023-08-20 NOTE — ED Provider Notes (Signed)
 [facility]ERGENCY DEPARTMENT AT [facility] Provider Note   CSN: [id number] Arrival date & time: 08/20/23  9146     Patient presents with: Abdominal Pain and Nausea   [name] is a 33 y.o. male.   Patient here with nausea and vomiting and abdominal discomfort for the last 3 days.  Some loose stools.  He suspects may be some suspicious food intake.  He has not been able to keep anything down.  No major medical problems.  He did have right foot surgery a while back.  He does notice maybe some blood in his stool.  Feels dehydrated.  He feels weak.  The history is provided by the patient.       Prior to Admission medications   Medication Sig Start Date End Date Taking? Authorizing Provider  promethazine  (PHENERGAN ) 25 MG tablet Take 1 tablet (25 mg total) by mouth every 6 (six) hours as needed for nausea or vomiting. 08/20/23  Yes Iverna Hammac, DO  amphetamine-dextroamphetamine (ADDERALL XR) 15 MG 24 hr capsule Take by mouth every morning. 06/18/19   [provider]  clotrimazole -betamethasone  (LOTRISONE ) cream Apply 1 Application topically daily. 07/29/23   Janit Thresa HERO, DPM  ibuprofen  (ADVIL ) 800 MG tablet Take 1 tablet (800 mg total) by mouth every 6 (six) hours as needed. 07/13/23   Tobie Franky SQUIBB, DPM  mupirocin  ointment (BACTROBAN ) 2 % Apply 1 Application topically 2 (two) times daily. 07/29/23   Janit Thresa HERO, DPM  oxyCODONE -acetaminophen  (PERCOCET) 5-325 MG tablet Take 1 tablet by mouth every 4 (four) hours as needed for up to 30 doses for severe pain (pain score 7-10). 07/17/23   Lamount Ethan CROME, DPM    Allergies: Patient has no known allergies.    Review of Systems  Updated Vital Signs BP (!) 146/94   Pulse 61   Temp 99.6 F (37.6 C) (Oral)   Resp (!) 22   Ht 6' 3 (1.905 m)   Wt 108.9 kg   SpO2 99%   BMI 30.00 kg/m   Physical Exam Vitals and nursing note reviewed.  Constitutional:      General: He is not in acute distress.    Appearance:  He is well-developed. He is not ill-appearing.  HENT:     Head: Normocephalic and atraumatic.     Mouth/Throat:     Mouth: Mucous membranes are moist.  Eyes:     Extraocular Movements: Extraocular movements intact.     Conjunctiva/sclera: Conjunctivae normal.  Cardiovascular:     Rate and Rhythm: Normal rate and regular rhythm.     Heart sounds: Normal heart sounds. No murmur heard. Pulmonary:     Effort: Pulmonary effort is normal. No respiratory distress.     Breath sounds: Normal breath sounds.  Abdominal:     Palpations: Abdomen is soft.     Tenderness: There is generalized abdominal tenderness.  Musculoskeletal:        General: No swelling.     Cervical back: Neck supple.  Skin:    General: Skin is warm and dry.     Capillary Refill: Capillary refill takes less than 2 seconds.  Neurological:     Mental Status: He is alert.  Psychiatric:        Mood and Affect: Mood normal.     (all labs ordered are listed, but only abnormal results are displayed) Labs Reviewed  CBC WITH DIFFERENTIAL/PLATELET - Abnormal; Notable for the following components:      Result Value  WBC 14.6 (*)    Neutro Abs 11.4 (*)    Monocytes Absolute 1.1 (*)    All other components within normal limits  COMPREHENSIVE METABOLIC PANEL WITH GFR - Abnormal; Notable for the following components:   Potassium 3.4 (*)    Glucose, Bld 111 (*)    Calcium 10.7 (*)    Total Protein 8.3 (*)    Albumin 5.1 (*)    AST 42 (*)    ALT 45 (*)    Anion gap 16 (*)    All other components within normal limits  URINALYSIS, ROUTINE W REFLEX MICROSCOPIC - Abnormal; Notable for the following components:   Specific Gravity, Urine 1.034 (*)    Protein, ur 30 (*)    All other components within normal limits  LIPASE, BLOOD    EKG: EKG Interpretation Date/Time:  Thursday August 20 2023 09:08:06 EDT Ventricular Rate:  83 PR Interval:  160 QRS Duration:  106 QT Interval:  357 QTC Calculation: 420 R Axis:   65  Text  Interpretation: Sinus rhythm Right atrial enlargement Confirmed by Ruthe Cornet [id number]) on 08/20/2023 10:23:29 AM  Radiology: CT ABDOMEN PELVIS W CONTRAST Result Date: 08/20/2023 CLINICAL DATA:  Abdominal pain EXAM: CT ABDOMEN AND PELVIS WITH CONTRAST TECHNIQUE: Multidetector CT imaging of the abdomen and pelvis was performed using the standard protocol following bolus administration of intravenous contrast. RADIATION DOSE REDUCTION: This exam was performed according to the departmental dose-optimization program which includes automated exposure control, adjustment of the mA and/or kV according to patient size and/or use of iterative reconstruction technique. CONTRAST:  OMNIPAQUE  IOHEXOL  300 MG/ML  SOLN COMPARISON:  None Available. FINDINGS: Lower chest: Lung bases are clear. Hepatobiliary: No focal hepatic lesion. Normal gallbladder. No biliary duct dilatation. Common bile duct is normal. Pancreas: Pancreas is normal. No ductal dilatation. No pancreatic inflammation. Spleen: Normal spleen Adrenals/urinary tract: Adrenal glands and kidneys are normal. The ureters and bladder normal. Stomach/Bowel: Stomach, small bowel, appendix, and cecum are normal. The colon and rectosigmoid colon are normal. Vascular/Lymphatic: Abdominal aorta is normal caliber. No periportal or retroperitoneal adenopathy. No pelvic adenopathy. Reproductive: Prostate unremarkable Other: No free fluid. Musculoskeletal: No aggressive osseous lesion. IMPRESSION: 1. No acute findings in the abdomen pelvis. 2. Normal appendix. 3. No bowel obstruction. Electronically Signed   By: Jackquline Boxer M.D.   On: 08/20/2023 10:39     Procedures   Medications Ordered in the ED  promethazine  (PHENERGAN ) 25 MG/ML injection (  Not Given 08/20/23 0955)  ondansetron  (ZOFRAN ) 4 MG/2ML injection (  Canceled Entry 08/20/23 1124)  0.9 %  sodium chloride  infusion (has no administration in time range)  metoCLOPramide  (REGLAN ) injection 10 mg (has no  administration in time range)  diphenhydrAMINE  (BENADRYL ) injection 12.5 mg (has no administration in time range)  promethazine  (PHENERGAN ) 12.5 mg in sodium chloride  0.9 % 50 mL IVPB (0 mg Intravenous Stopped 08/20/23 1024)  sodium chloride  0.9 % bolus 1,000 mL (1,000 mLs Intravenous New Bag/Given 08/20/23 0948)  iohexol  (OMNIPAQUE ) 300 MG/ML solution 100 mL (100 mLs Intravenous Contrast Given 08/20/23 0959)  fentaNYL  (SUBLIMAZE ) injection 50 mcg (50 mcg Intravenous Given 08/20/23 1121)  ondansetron  (ZOFRAN ) 8 mg in sodium chloride  0.9 % 50 mL IVPB (8 mg Intravenous New Bag/Given 08/20/23 1124)                                    Medical Decision Making Amount and/or Complexity  of Data Reviewed Labs: ordered. Radiology: ordered.  Risk Prescription drug management.   Mete Purdum is here with nausea vomiting diarrhea abdominal pain generalized weakness concern for dehydration.  Normal vitals.  No fever.  Differential diagnosis colitis versus viral/foodborne illness versus less likely bowel obstruction appendicitis perforated viscus.  Will check CBC CMP lipase urinalysis CT pelvis dose IV Phenergan  and IV fluids and reevaluate.  Lab work overall unremarkable except for mild leukocytosis.  No significant electrolyte abnormality or kidney injury.  CT scan abdomen pelvis shows no acute findings.  However patient still unable to tolerate p.o.  He has had some excessive vomiting despite 8 mg of Zofran  and Phenergan  fentanyl  IV fluids.  I think he is high risk to just bounce back here and I think it would be reasonable to admit him for observation for further supportive care.  Will talk to hospitalist for admission.  This chart was dictated using voice recognition software.  Despite best efforts to proofread,  errors can occur which can change the documentation meaning.      Final diagnoses:  Nausea and vomiting, unspecified vomiting type  Abdominal pain, unspecified abdominal location   Diarrhea, unspecified type  Gastroenteritis    ED Discharge Orders          Ordered    promethazine  (PHENERGAN ) 25 MG tablet  Every 6 hours PRN        08/20/23 1154               Oriya Kettering, DO 08/20/23 1240

## 2023-08-20 NOTE — H&P (Signed)
 History and Physical    Patient: [name] FMW:[id number] DOB: March 12, 1990 DOA: 08/20/2023 DOS: the patient was seen and examined on 08/20/2023 PCP: Sun, Vyvyan, MD  Patient coming from: Home  Chief Complaint:  Chief Complaint  Patient presents with   Abdominal Pain   Nausea   HPI: [name] is a 33 y.o. male with medical history significant of ADHD, hooping cough who presented to the emergency department complaints of abdominal pain, nausea and numerous episodes of emesis since Monday evening (3 days ago) after he ate a shrimp Alfredo pasta dish that was reheated.  Nobody else a from this.  He has had 2 episodes of diarrhea 1 on Tuesday and 1 yesterday.  He has felt lightheaded and diaphoretic at times.   No constipation, melena or hematochezia.  No flank pain, dysuria, frequency or hematuria.  His urine looks dark and concentrated. He denied fever, chills, rhinorrhea, sore throat, wheezing or hemoptysis.  No chest pain, palpitations, diaphoresis, PND, orthopnea or pitting edema of the lower extremities. No polyuria, polydipsia, polyphagia or blurred vision.   Lab work: Urinalysis showed a specific gravity of 1.0 34 and protein of 30 mg/dL, but is otherwise normal.  CBC showed a white count of 14.6, hemoglobin 16.6 g/dL and platelets 708.  Lipase level was normal failure CMP showed potassium of 3.4 mmol/L, the rest of the electrolytes and renal function were normal after calcium correction.  Total protein 8.3 and albumin 5.1 g/dL.  AST 42 ALT 45 units/L.  Normal bilirubin and alkaline phosphatase.  Imaging: CT abdomen/pelvis with contrast with no acute findings in the abdomen or pelvis.  Normal appendix.  No bowel obstruction.   ED course: Initial vital signs were temperature 99.6 F, pulse 63, respiration 17, BP 138/105 mmHg O2 sat 97% on room air.  The patient received 1000 mL of normal saline bolus, Phenergan  12.5 mg IVPB, ondansetron  8 mg IVPB, metoclopramide  10 mg IVP, fentanyl  50  mcg IVP and diphenhydramine  12.5 mg IVP.  Review of Systems: As mentioned in the history of present illness. All other systems reviewed and are negative. Past Medical History:  Diagnosis Date   ADHD    Whooping cough    Past Surgical History:  Procedure Laterality Date   FOOT SURGERY  06/2023   Social History:  reports that he has been smoking e-cigarettes. He has never used smokeless tobacco. He reports current alcohol use. He reports that he does not use drugs.  No Known Allergies  Family History  Problem Relation Age of Onset   Hypertension Mother     Prior to Admission medications   Medication Sig Start Date End Date Taking? Authorizing Provider  amphetamine-dextroamphetamine (ADDERALL) 30 MG tablet Take 1 tablet by mouth 2 (two) times daily. 07/20/23  Yes [provider]  promethazine  (PHENERGAN ) 25 MG tablet Take 1 tablet (25 mg total) by mouth every 6 (six) hours as needed for nausea or vomiting. 08/20/23  Yes Curatolo, Adam, DO  clotrimazole -betamethasone  (LOTRISONE ) cream Apply 1 Application topically daily. Patient not taking: Reported on 08/20/2023 07/29/23   Janit Thresa HERO, DPM  ibuprofen  (ADVIL ) 800 MG tablet Take 1 tablet (800 mg total) by mouth every 6 (six) hours as needed. Patient not taking: Reported on 08/20/2023 07/13/23   Tobie Franky SQUIBB, DPM  mupirocin  ointment (BACTROBAN ) 2 % Apply 1 Application topically 2 (two) times daily. Patient not taking: Reported on 08/20/2023 07/29/23   Janit Thresa HERO, DPM  oxyCODONE -acetaminophen  (PERCOCET) 5-325 MG tablet Take 1 tablet by  mouth every 4 (four) hours as needed for up to 30 doses for severe pain (pain score 7-10). Patient not taking: Reported on 08/20/2023 07/17/23   Lamount Ethan CROME, [place]    Physical Exam: Vitals:   08/20/23 1328 08/20/23 1330 08/20/23 1338 08/20/23 1427  BP:  (!) 148/89  122/81  Pulse: (!) 52 (!) 56 (!) 58 (!) 58  Resp: (!) 26 (!) 27 (!) 28 19  Temp:    98.3 F (36.8 C)  TempSrc:      SpO2: 94% 95%  96% 100%  Weight:      Height:       Physical Exam Vitals and nursing note reviewed.  Constitutional:      General: He is awake. He is not in acute distress.    Appearance: He is well-developed. He is ill-appearing.  HENT:     Head: Normocephalic.     Nose: No rhinorrhea.     Mouth/Throat:     Mouth: Mucous membranes are dry.  Eyes:     General: No scleral icterus.    Pupils: Pupils are equal, round, and reactive to light.  Neck:     Vascular: No JVD.  Cardiovascular:     Rate and Rhythm: Normal rate and regular rhythm.     Heart sounds: S1 normal and S2 normal.  Pulmonary:     Effort: Pulmonary effort is normal.     Breath sounds: Normal breath sounds. No wheezing, rhonchi or rales.  Abdominal:     General: Bowel sounds are normal. There is no distension.     Tenderness: There is abdominal tenderness in the epigastric area. There is no right CVA tenderness, left CVA tenderness or guarding.  Musculoskeletal:     Cervical back: Neck supple.     Right lower leg: No edema.     Left lower leg: No edema.  Skin:    General: Skin is warm and dry.  Neurological:     General: No focal deficit present.     Mental Status: He is alert and oriented to person, place, and time.  Psychiatric:        Mood and Affect: Mood normal.        Behavior: Behavior normal. Behavior is cooperative.     Data Reviewed:  Results are pending, will review when available.  EKG: Vent. rate 83 BPM PR interval 160 ms QRS duration 106 ms QT/QTcB 357/420 ms P-R-T axes 75 65 26 Sinus rhythm Right atrial enlargement  Assessment and Plan: Principal Problem:   Acute gastroenteritis Observation/MedSurg. Continue IV fluids. Keep n.p.o. for now. Advance diet as tolerated. Analgesics as needed. Antiemetics as needed. Pantoprazole  40 mg IVP x 1. Follow CBC, CMP in AM.  Active Problems:   Volume depletion Continue IV fluids.    Hyperproteinemia Hemoconcentrated. Continue IV fluids.     Hypercalcemia Secondary to hemoconcentration. Continue IV fluids. Follow calcium level in AM.    Hypokalemia Replacing. Follow-up level in the morning.    Class 1 obesity Current BMI 30.00 kg/m. Would benefit from lifestyle modifications. Follow-up with primary care provider as an outpatient.    Transaminitis Secondary to volume depletion. Follow-up transaminases in AM.    Advance Care Planning:   Code Status: Full Code   Consults:   Family Communication:   Severity of Illness: The appropriate patient status for this patient is OBSERVATION. Observation status is judged to be reasonable and necessary in order to provide the required intensity of service to ensure the patient's safety.  The patient's presenting symptoms, physical exam findings, and initial radiographic and laboratory data in the context of their medical condition is felt to place them at decreased risk for further clinical deterioration. Furthermore, it is anticipated that the patient will be medically stable for discharge from the hospital within 2 midnights of admission.   Author: Alm Dorn Castor, MD 08/20/2023 3:05 PM  For on call review [email or web address].   This document was prepared using Dragon voice recognition software and may contain some unintended transcription errors.

## 2023-08-20 NOTE — ED Triage Notes (Signed)
 Pt caox4, ambulatory c/o N/V x3 days.

## 2023-08-21 DIAGNOSIS — F17290 Nicotine dependence, other tobacco product, uncomplicated: Secondary | ICD-10-CM | POA: Diagnosis present

## 2023-08-21 DIAGNOSIS — E66811 Obesity, class 1: Secondary | ICD-10-CM | POA: Diagnosis present

## 2023-08-21 DIAGNOSIS — A09 Infectious gastroenteritis and colitis, unspecified: Secondary | ICD-10-CM | POA: Diagnosis present

## 2023-08-21 DIAGNOSIS — R112 Nausea with vomiting, unspecified: Secondary | ICD-10-CM | POA: Diagnosis present

## 2023-08-21 DIAGNOSIS — A059 Bacterial foodborne intoxication, unspecified: Secondary | ICD-10-CM | POA: Diagnosis present

## 2023-08-21 DIAGNOSIS — R748 Abnormal levels of other serum enzymes: Secondary | ICD-10-CM | POA: Diagnosis present

## 2023-08-21 DIAGNOSIS — Z1152 Encounter for screening for COVID-19: Secondary | ICD-10-CM | POA: Diagnosis not present

## 2023-08-21 DIAGNOSIS — E876 Hypokalemia: Secondary | ICD-10-CM | POA: Diagnosis present

## 2023-08-21 DIAGNOSIS — Z8249 Family history of ischemic heart disease and other diseases of the circulatory system: Secondary | ICD-10-CM | POA: Diagnosis not present

## 2023-08-21 DIAGNOSIS — K529 Noninfective gastroenteritis and colitis, unspecified: Secondary | ICD-10-CM | POA: Diagnosis not present

## 2023-08-21 DIAGNOSIS — E8809 Other disorders of plasma-protein metabolism, not elsewhere classified: Secondary | ICD-10-CM | POA: Diagnosis present

## 2023-08-21 DIAGNOSIS — E860 Dehydration: Secondary | ICD-10-CM | POA: Diagnosis present

## 2023-08-21 DIAGNOSIS — Z6830 Body mass index (BMI) 30.0-30.9, adult: Secondary | ICD-10-CM | POA: Diagnosis not present

## 2023-08-21 LAB — CBC
HCT: 42.7 % (ref 39.0–52.0)
Hemoglobin: 14.1 g/dL (ref 13.0–17.0)
MCH: 31.2 pg (ref 26.0–34.0)
MCHC: 33 g/dL (ref 30.0–36.0)
MCV: 94.5 fL (ref 80.0–100.0)
Platelets: 242 K/uL (ref 150–400)
RBC: 4.52 MIL/uL (ref 4.22–5.81)
RDW: 12.6 % (ref 11.5–15.5)
WBC: 12.3 K/uL — ABNORMAL HIGH (ref 4.0–10.5)
nRBC: 0 % (ref 0.0–0.2)

## 2023-08-21 LAB — COMPREHENSIVE METABOLIC PANEL WITH GFR
ALT: 38 U/L (ref 0–44)
AST: 28 U/L (ref 15–41)
Albumin: 3.9 g/dL (ref 3.5–5.0)
Alkaline Phosphatase: 47 U/L (ref 38–126)
Anion gap: 9 (ref 5–15)
BUN: 15 mg/dL (ref 6–20)
CO2: 25 mmol/L (ref 22–32)
Calcium: 8.8 mg/dL — ABNORMAL LOW (ref 8.9–10.3)
Chloride: 103 mmol/L (ref 98–111)
Creatinine, Ser: 0.89 mg/dL (ref 0.61–1.24)
GFR, Estimated: >60 mL/min (ref >60)
Glucose, Bld: 106 mg/dL — ABNORMAL HIGH (ref 70–99)
Potassium: 3.3 mmol/L — ABNORMAL LOW (ref 3.5–5.1)
Sodium: 137 mmol/L (ref 135–145)
Total Bilirubin: 1.2 mg/dL (ref 0.0–1.2)
Total Protein: 6.8 g/dL (ref 6.5–8.1)

## 2023-08-21 LAB — HIV ANTIBODY (ROUTINE TESTING W REFLEX): HIV Screen 4th Generation wRfx: NONREACTIVE

## 2023-08-21 LAB — OCCULT BLOOD X 1 CARD TO LAB, STOOL: Fecal Occult Bld: POSITIVE — AB

## 2023-08-21 MED ORDER — PANTOPRAZOLE SODIUM 40 MG IV SOLR
40.0000 mg | INTRAVENOUS | Status: DC
  Administered 2023-08-21: 40 mg via INTRAVENOUS
  Filled 2023-08-21: qty 10

## 2023-08-21 MED ORDER — PROMETHAZINE (PHENERGAN) 6.25MG IN NS 50ML IVPB
6.2500 mg | Freq: Four times a day (QID) | INTRAVENOUS | Status: DC | PRN
  Administered 2023-08-21: 6.25 mg via INTRAVENOUS
  Filled 2023-08-21: qty 6.25

## 2023-08-21 MED ORDER — SODIUM CHLORIDE 0.9 % IV SOLN: Freq: Once | INTRAVENOUS | Status: AC

## 2023-08-21 NOTE — Progress Notes (Signed)
 PROGRESS NOTE    [name]  FMW:[id number] DOB: May 19, 1990 DOA: 08/20/2023 PCP: Sun, Vyvyan, MD   Brief Narrative:  [name] is a 33 y.o. male with medical history significant of ADHD, hooping cough who presented to the emergency department complaints of abdominal pain, nausea and numerous episodes of emesis since Monday evening (3 days ago) after he ate a shrimp Alfredo pasta dish that was reheated.    Assessment & Plan:   Principal Problem:   Acute gastroenteritis Active Problems:   Hyperproteinemia   Hypercalcemia   Hypokalemia   Class 1 obesity   Transaminitis   Volume depletion   Acute gastroenteritis, likely foodborne illness Rule out GI bleed - Maroon BM overnight with FOBT positive Continue IV fluids. Failed PO intake - keep n.p.o. for now. PPI/reglan  ongoing   Dehydration secondary to above Continue IV fluids.   Hyperproteinemia Hemoconcentrated. Continue IV fluids.   Hypercalcemia Secondary to hemoconcentration. Continue IV fluids. Follow calcium level in AM.   Hypokalemia Replacing. Follow-up level in the morning.   Class 1 obesity Current BMI 30.00 kg/m. Would benefit from lifestyle modifications. Follow-up with primary care provider as an outpatient.   Elevated liver enzyme, mild, resolved Secondary to volume depletion. Follow-up transaminases in AM.  DVT prophylaxis: enoxaparin  (LOVENOX ) injection 40 mg Start: 08/20/23 2200 Code Status:   Code Status: Full Code Family Communication: None present  Status is: Inpt  Dispo: The patient is from: Home              Anticipated d/c is to: Home              Anticipated d/c date is: 24-48h              Patient currently not medically stable for discharge  Consultants:  None  Procedures:  None  Antimicrobials:  None   Subjective: No acute issues/events overnight - continues to have nausea/episodes of vomiting.  Objective: Vitals:   08/20/23 1427 08/20/23 1815 08/20/23  2117 08/21/23 0411  BP: 122/81 139/89 130/88 135/84  Pulse: (!) 58 67 72 60  Resp: 19 18 18 18   Temp: 98.3 F (36.8 C) 98.3 F (36.8 C) 97.7 F (36.5 C) 98.5 F (36.9 C)  TempSrc:      SpO2: 100% 100% 99% 97%  Weight:      Height:        Intake/Output Summary (Last 24 hours) at 08/21/2023 0727 Last data filed at 08/20/2023 1600 Gross per 24 hour  Intake 1647.54 ml  Output --  Net 1647.54 ml   Filed Weights   08/20/23 0915  Weight: 108.9 kg    Examination:  General exam: Appears calm and comfortable  Respiratory system: Clear to auscultation. Respiratory effort normal. Cardiovascular system: S1 & S2 heard, RRR. No JVD, murmurs, rubs, gallops or clicks. No pedal edema. Gastrointestinal system: Abdomen is nondistended, soft and nontender. No organomegaly or masses felt. Normal bowel sounds heard. Central nervous system: Alert and oriented. No focal neurological deficits. Extremities: Symmetric 5 x 5 power. Skin: No rashes, lesions or ulcers Psychiatry: Judgement and insight appear normal. Mood & affect appropriate.     Data Reviewed: I have personally reviewed following labs and imaging studies  CBC: Recent Labs  Lab 08/18/23 2254 08/20/23 0950 08/21/23 0413  WBC 12.3* 14.6* 12.3*  NEUTROABS  --  11.4*  --   HGB 15.7 16.6 14.1  HCT 45.2 46.7 42.7  MCV 90.9 89.3 94.5  PLT 270 291 242   Basic Metabolic  Panel: Recent Labs  Lab 08/18/23 2254 08/20/23 0950 08/21/23 0413  NA 141 140 137  K 4.2 3.4* 3.3*  CL 103 100 103  CO2 23 24 25   GLUCOSE 114* 111* 106*  BUN 11 14 15   CREATININE 0.91 0.84 0.89  CALCIUM 10.4* 10.7* 8.8*   GFR: Estimated Creatinine Clearance: 156 mL/min (by C-G formula based on SCr of 0.89 mg/dL). Liver Function Tests: Recent Labs  Lab 08/18/23 2254 08/20/23 0950 08/21/23 0413  AST 25 42* 28  ALT 25 45* 38  ALKPHOS 73 71 47  BILITOT 0.6 0.8 1.2  PROT 8.1 8.3* 6.8  ALBUMIN 5.1* 5.1* 3.9   Recent Labs  Lab 08/18/23 2254  08/20/23 0950  LIPASE 16 18   No results for input(s): AMMONIA in the last 168 hours. Coagulation Profile: No results for input(s): INR, PROTIME in the last 168 hours. Cardiac Enzymes: No results for input(s): CKTOTAL, CKMB, CKMBINDEX, TROPONINI in the last 168 hours. BNP (last 3 results) No results for input(s): PROBNP in the last 8760 hours. HbA1C: No results for input(s): HGBA1C in the last 72 hours. CBG: No results for input(s): GLUCAP in the last 168 hours. Lipid Profile: No results for input(s): CHOL, HDL, LDLCALC, TRIG, CHOLHDL, LDLDIRECT in the last 72 hours. Thyroid Function Tests: No results for input(s): TSH, T4TOTAL, FREET4, T3FREE, THYROIDAB in the last 72 hours. Anemia Panel: No results for input(s): VITAMINB12, FOLATE, FERRITIN, TIBC, IRON, RETICCTPCT in the last 72 hours. Sepsis Labs: No results for input(s): PROCALCITON, LATICACIDVEN in the last 168 hours.  Recent Results (from the past 240 hours)  Resp panel by RT-PCR (RSV, Flu A&B, Covid) Anterior Nasal Swab     Status: None   Collection Time: 08/18/23 10:54 PM   Specimen: Anterior Nasal Swab  Result Value Ref Range Status   SARS Coronavirus 2 by RT PCR NEGATIVE NEGATIVE Final    Comment: (NOTE) SARS-CoV-2 target nucleic acids are NOT DETECTED.  The SARS-CoV-2 RNA is generally detectable in upper respiratory specimens during the acute phase of infection. The lowest concentration of SARS-CoV-2 viral copies this assay can detect is 138 copies/mL. A negative result does not preclude SARS-Cov-2 infection and should not be used as the sole basis for treatment or other patient management decisions. A negative result may occur with  improper specimen collection/handling, submission of specimen other than nasopharyngeal swab, presence of viral mutation(s) within the areas targeted by this assay, and inadequate number of viral copies(<138 copies/mL). A  negative result must be combined with clinical observations, patient history, and epidemiological information. The expected result is Negative.  Fact Sheet for Patients:  BloggerCourse.com  Fact Sheet for Healthcare Providers:  SeriousBroker.it  This test is no t yet approved or cleared by the [place]  FDA and  has been authorized for detection and/or diagnosis of SARS-CoV-2 by FDA under an Emergency Use Authorization (EUA). This EUA will remain  in effect (meaning this test can be used) for the duration of the COVID-19 declaration under Section 564(b)(1) of the Act, 21 U.S.C.section 360bbb-3(b)(1), unless the authorization is terminated  or revoked sooner.       Influenza A by PCR NEGATIVE NEGATIVE Final   Influenza B by PCR NEGATIVE NEGATIVE Final    Comment: (NOTE) The Xpert Xpress SARS-CoV-2/FLU/RSV plus assay is intended as an aid in the diagnosis of influenza from Nasopharyngeal swab specimens and should not be used as a sole basis for treatment. Nasal washings and aspirates are unacceptable for Xpert Xpress SARS-CoV-2/FLU/RSV testing.  Fact Sheet for Patients: BloggerCourse.com  Fact Sheet for Healthcare Providers: SeriousBroker.it  This test is not yet approved or cleared by the [place]  FDA and has been authorized for detection and/or diagnosis of SARS-CoV-2 by FDA under an Emergency Use Authorization (EUA). This EUA will remain in effect (meaning this test can be used) for the duration of the COVID-19 declaration under Section 564(b)(1) of the Act, 21 U.S.C. section 360bbb-3(b)(1), unless the authorization is terminated or revoked.     Resp Syncytial Virus by PCR NEGATIVE NEGATIVE Final    Comment: (NOTE) Fact Sheet for Patients: BloggerCourse.com  Fact Sheet for Healthcare  Providers: SeriousBroker.it  This test is not yet approved or cleared by the [place]  FDA and has been authorized for detection and/or diagnosis of SARS-CoV-2 by FDA under an Emergency Use Authorization (EUA). This EUA will remain in effect (meaning this test can be used) for the duration of the COVID-19 declaration under Section 564(b)(1) of the Act, 21 U.S.C. section 360bbb-3(b)(1), unless the authorization is terminated or revoked.  Performed at [facility], [address], [place], [place] [address]          Radiology Studies: CT ABDOMEN PELVIS W CONTRAST Result Date: 08/20/2023 CLINICAL DATA:  Abdominal pain EXAM: CT ABDOMEN AND PELVIS WITH CONTRAST TECHNIQUE: Multidetector CT imaging of the abdomen and pelvis was performed using the standard protocol following bolus administration of intravenous contrast. RADIATION DOSE REDUCTION: This exam was performed according to the departmental dose-optimization program which includes automated exposure control, adjustment of the mA and/or kV according to patient size and/or use of iterative reconstruction technique. CONTRAST:  100mL OMNIPAQUE  IOHEXOL  300 MG/ML  SOLN COMPARISON:  None Available. FINDINGS: Lower chest: Lung bases are clear. Hepatobiliary: No focal hepatic lesion. Normal gallbladder. No biliary duct dilatation. Common bile duct is normal. Pancreas: Pancreas is normal. No ductal dilatation. No pancreatic inflammation. Spleen: Normal spleen Adrenals/urinary tract: Adrenal glands and kidneys are normal. The ureters and bladder normal. Stomach/Bowel: Stomach, small bowel, appendix, and cecum are normal. The colon and rectosigmoid colon are normal. Vascular/Lymphatic: Abdominal aorta is normal caliber. No periportal or retroperitoneal adenopathy. No pelvic adenopathy. Reproductive: Prostate unremarkable Other: No free fluid. Musculoskeletal: No aggressive osseous lesion. IMPRESSION: 1. No  acute findings in the abdomen pelvis. 2. Normal appendix. 3. No bowel obstruction. Electronically Signed   By: Jackquline Boxer M.D.   On: 08/20/2023 10:39        Scheduled Meds:  enoxaparin  (LOVENOX ) injection  40 mg Subcutaneous Q24H   metoCLOPramide  (REGLAN ) injection  10 mg Intravenous Q6H   Continuous Infusions:   LOS: 0 days   Time spent:  Elsie JAYSON [place], DO [facility]  If 7PM-7AM, please contact night-coverage [email or web address]  08/21/2023, 7:27 AM

## 2023-08-21 NOTE — Plan of Care (Signed)

## 2023-08-21 NOTE — TOC Initial Note (Signed)
 Transition of Care [facility]) - Initial/Assessment Note    Patient Details  Name: [name] MRN: [id number] Date of Birth: Feb 26, 1990  [facility]) CM/SW Contact:    Yashvi Jasinski, Glenys DASEN, RN Clinical Narrative:                 Presented for abdominal pain, nausea and emesis. CM spoke with patient and spouse Estaban [place] [phone] in the room. PTA states lives in a house; Verified PCP/insurance; denies DME,HH,oxygen and SDOH needs; Connor states she will transport patient home at discharge. No CM needs identified during visit. Please place consult if needs present.  Expected Discharge Plan: Home/Self Care Barriers to Discharge: No Barriers Identified   Patient Goals and CMS Choice Patient states their goals for this hospitalization and ongoing recovery are:: Home with wife CMS Medicare.gov Compare Post Acute Care list provided to::  (NA) Choice offered to / list presented to : NA [place] ownership interest in [facility].provided to:: Parent NA    Expected Discharge Plan and Services In-house Referral: NA Discharge Planning Services: CM Consult Post Acute Care Choice: NA Living arrangements for the past 2 months: Single Family Home                 DME Arranged: N/A DME Agency: NA       HH Arranged: NA HH Agency: NA        Prior Living Arrangements/Services Living arrangements for the past 2 months: Single Family Home Lives with:: Spouse Patient language and need for interpreter reviewed:: Yes Do you feel safe going back to the place where you live?: Yes      Need for Family Participation in Patient Care: No (Comment) Care giver support system in place?: Yes (comment) Current home services:  (NA) Criminal Activity/Legal Involvement Pertinent to Current Situation/Hospitalization: No - Comment as needed  Activities of Daily Living   ADL Screening (condition at time of admission) Independently performs ADLs?: Yes (appropriate for developmental  age) Is the patient deaf or have difficulty hearing?: No Does the patient have difficulty seeing, even when wearing glasses/contacts?: No Does the patient have difficulty concentrating, remembering, or making decisions?: No  Permission Sought/Granted Permission sought to share information with : Case Manager Permission granted to share information with : Yes, Verbal Permission Granted  Share Information with NAME: MAGUIRE, SIME (Spouse)  [phone]           Emotional Assessment Appearance:: Appears stated age Attitude/Demeanor/Rapport: Suspicious, Guarded Affect (typically observed): Frustrated, Guarded Orientation: : Oriented to Self, Oriented to Place, Oriented to  Time, Oriented to Situation Alcohol / Substance Use: Not Applicable Psych Involvement: No (comment)  Admission diagnosis:  Acute gastroenteritis [K52.9] Gastroenteritis [K52.9] Abdominal pain, unspecified abdominal location [R10.9] Diarrhea, unspecified type [R19.7] Nausea and vomiting, unspecified vomiting type [R11.2] Patient Active Problem List   Diagnosis Date Noted   Acute gastroenteritis 08/20/2023   Hyperproteinemia 08/20/2023   Hypercalcemia 08/20/2023   Hypokalemia 08/20/2023   Class 1 obesity 08/20/2023   Transaminitis 08/20/2023   Volume depletion 08/20/2023   PCP:  Sun, Vyvyan, MD Pharmacy:   [id number] [place], [place] - [address] AT [facility] OF [address] [place] [address] Phone: [phone] Fax: [phone]  [id number] [place], [place] - [address] AT [facility] [address] [place] [address] Phone: [phone] Fax: [phone]     Social Drivers of Health (SDOH) Social History:  SDOH Screenings   Food Insecurity: No Food Insecurity (08/20/2023)  Housing: Low Risk  (08/20/2023)  Transportation Needs: No Transportation Needs (08/20/2023)  Utilities: At Risk (08/20/2023)   Tobacco Use: High Risk (08/20/2023)   SDOH Interventions:     Readmission Risk Interventions     No data to display

## 2023-08-22 ENCOUNTER — Other Ambulatory Visit (HOSPITAL_BASED_OUTPATIENT_CLINIC_OR_DEPARTMENT_OTHER): Payer: Self-pay

## 2023-08-22 ENCOUNTER — Other Ambulatory Visit (HOSPITAL_COMMUNITY): Payer: Self-pay

## 2023-08-22 DIAGNOSIS — K529 Noninfective gastroenteritis and colitis, unspecified: Secondary | ICD-10-CM | POA: Diagnosis not present

## 2023-08-22 LAB — GASTROINTESTINAL PANEL BY PCR, STOOL (REPLACES STOOL CULTURE)

## 2023-08-22 LAB — CBC
HCT: 40 % (ref 39.0–52.0)
Hemoglobin: 13.5 g/dL (ref 13.0–17.0)
MCH: 31.2 pg (ref 26.0–34.0)
MCHC: 33.8 g/dL (ref 30.0–36.0)
MCV: 92.4 fL (ref 80.0–100.0)
Platelets: 221 K/uL (ref 150–400)
RBC: 4.33 MIL/uL (ref 4.22–5.81)
RDW: 11.9 % (ref 11.5–15.5)
WBC: 8.5 K/uL (ref 4.0–10.5)
nRBC: 0 % (ref 0.0–0.2)

## 2023-08-22 MED ORDER — PANTOPRAZOLE SODIUM 40 MG PO TBEC
40.0000 mg | DELAYED_RELEASE_TABLET | Freq: Two times a day (BID) | ORAL | 0 refills | Status: DC
  Filled 2023-08-22 (×2): qty 60, 30d supply, fill #0

## 2023-08-22 MED ORDER — ONDANSETRON HCL 4 MG PO TABS
4.0000 mg | ORAL_TABLET | Freq: Every day | ORAL | 1 refills | Status: DC | PRN
  Filled 2023-08-22 (×2): qty 30, 30d supply, fill #0

## 2023-08-22 MED ORDER — PROMETHAZINE HCL 25 MG PO TABS
25.0000 mg | ORAL_TABLET | Freq: Four times a day (QID) | ORAL | 0 refills | Status: DC | PRN
  Filled 2023-08-22: qty 30, 8d supply, fill #0

## 2023-08-22 NOTE — Discharge Summary (Signed)
 Physician Discharge Summary  [name] FMW:[id number] DOB: Apr 18, 1990 DOA: 08/20/2023  PCP: Sun, Vyvyan, MD  Admit date: 08/20/2023 Discharge date: 08/22/2023  Admitted From: Home Disposition: Home  Recommendations for Outpatient Follow-up:  Follow up with PCP in 1-2 weeks  Home Health: None Equipment/Devices: None  Discharge Condition: Stable CODE STATUS: Full Diet recommendation: Low-salt low-fat low-carb diet  Brief/Interim Summary: [name] is a 33 y.o. male with medical history significant of ADHD, hooping cough who presented to the emergency department complaints of abdominal pain, nausea and numerous episodes of emesis since Monday evening (3 days ago) after he ate a shrimp Alfredo pasta dish that was reheated.  Patient admitted as above with concern over acute gastroenteritis after foodborne illness.  Questional episode of maroon stool at intake, FOBT positive but stool here normal/brown.  Patient admitted to increased intake of ibuprofen  after recent ankle surgery.  Discussed cessation of NSAIDs given high risk for GI bleeding.  Otherwise tolerating p.o, hemoglobin stable, discharge with PPI and antiemetics as below.   Discharge Diagnoses:  Principal Problem:   Acute gastroenteritis Active Problems:   Hyperproteinemia   Hypercalcemia   Hypokalemia   Class 1 obesity   Transaminitis   Volume depletion   Infectious colitis, enteritis, and gastroenteritis  Acute gastroenteritis, likely foodborne illness Rule out GI bleed - Maroon BM prior to intake, FOBT positive but notable brown stool overnight, hemoglobin stable  - Tolerating p.o. well -Stool PCR pending   Dehydration secondary to above Continue IV fluids.   Hyperproteinemia Hypercalcemia Hypokalemia Secondary to hemoconcentration. Continue p.o. intake, advance diet as tolerated   Class 1 obesity Current BMI 30.00 kg/m. Would benefit from lifestyle modifications. Follow-up with primary care  provider as an outpatient.   Elevated liver enzyme, mild, resolved Resolved    Discharge Instructions  Discharge Instructions     Call MD for:  difficulty breathing, headache or visual disturbances   Complete by: As directed    Call MD for:  extreme fatigue   Complete by: As directed    Call MD for:  hives   Complete by: As directed    Call MD for:  persistant dizziness or light-headedness   Complete by: As directed    Call MD for:  persistant nausea and vomiting   Complete by: As directed    Call MD for:  severe uncontrolled pain   Complete by: As directed    Call MD for:  temperature >100.4   Complete by: As directed    Diet - low sodium heart healthy   Complete by: As directed    Increase activity slowly   Complete by: As directed       Allergies as of 08/22/2023   No Known Allergies      Medication List     TAKE these medications    amphetamine-dextroamphetamine 30 MG tablet Commonly known as: ADDERALL Take 1 tablet by mouth 2 (two) times daily.   ondansetron  4 MG tablet Commonly known as: Zofran  Take 1 tablet (4 mg total) by mouth daily as needed for nausea or vomiting.   promethazine  25 MG tablet Commonly known as: PHENERGAN  Take 1 tablet (25 mg total) by mouth every 6 (six) hours as needed for nausea or vomiting.        Follow-up Information     Sun, Vyvyan, MD. Schedule an appointment as soon as possible for a visit in 2 days.   Specialty: Family Medicine Contact information: [phone] [place] [address]  [phone]         [facility] Emergency Department at [facility].   Specialty: Emergency Medicine Why: As needed, If symptoms worsen Contact information: [address] [place]  [address] [phone]               No Known Allergies  Consultations: None  Procedures/Studies: CT ABDOMEN PELVIS W CONTRAST Result Date: 08/20/2023 CLINICAL DATA:  Abdominal pain  EXAM: CT ABDOMEN AND PELVIS WITH CONTRAST TECHNIQUE: Multidetector CT imaging of the abdomen and pelvis was performed using the standard protocol following bolus administration of intravenous contrast. RADIATION DOSE REDUCTION: This exam was performed according to the departmental dose-optimization program which includes automated exposure control, adjustment of the mA and/or kV according to patient size and/or use of iterative reconstruction technique. CONTRAST:  OMNIPAQUE  IOHEXOL  300 MG/ML  SOLN COMPARISON:  None Available. FINDINGS: Lower chest: Lung bases are clear. Hepatobiliary: No focal hepatic lesion. Normal gallbladder. No biliary duct dilatation. Common bile duct is normal. Pancreas: Pancreas is normal. No ductal dilatation. No pancreatic inflammation. Spleen: Normal spleen Adrenals/urinary tract: Adrenal glands and kidneys are normal. The ureters and bladder normal. Stomach/Bowel: Stomach, small bowel, appendix, and cecum are normal. The colon and rectosigmoid colon are normal. Vascular/Lymphatic: Abdominal aorta is normal caliber. No periportal or retroperitoneal adenopathy. No pelvic adenopathy. Reproductive: Prostate unremarkable Other: No free fluid. Musculoskeletal: No aggressive osseous lesion. IMPRESSION: 1. No acute findings in the abdomen pelvis. 2. Normal appendix. 3. No bowel obstruction. Electronically Signed   By: Jackquline Boxer M.D.   On: 08/20/2023 10:39   DG Foot Complete Right Result Date: 08/17/2023 Please see detailed radiograph report in office note.    Subjective: No acute issues or events overnight   Discharge Exam: Vitals:   08/22/23 0626 08/22/23 1211  BP: (!) 140/82 (!) 142/87  Pulse: (!) 51 61  Resp: 18 16  Temp: 98.2 F (36.8 C) 98.9 F (37.2 C)  SpO2: 96% 97%   Vitals:   08/21/23 1231 08/21/23 2011 08/22/23 0626 08/22/23 1211  BP: (!) 153/88 127/82 (!) 140/82 (!) 142/87  Pulse: (!) 54 63 (!) 51 61  Resp: 16 18 18 16   Temp: 98.2 F (36.8 C)  98.8 F (37.1 C) 98.2 F (36.8 C) 98.9 F (37.2 C)  TempSrc:    Oral  SpO2: 98% 99% 96% 97%  Weight:      Height:        General: Pt is alert, awake, not in acute distress Cardiovascular: RRR, S1/S2 +, no rubs, no gallops Respiratory: CTA bilaterally, no wheezing, no rhonchi Abdominal: Soft, NT, ND, bowel sounds + Extremities: no edema, no cyanosis    The results of significant diagnostics from this hospitalization (including imaging, microbiology, ancillary and laboratory) are listed below for reference.     Microbiology: Recent Results (from the past 240 hours)  Resp panel by RT-PCR (RSV, Flu A&B, Covid) Anterior Nasal Swab     Status: None   Collection Time: 08/18/23 10:54 PM   Specimen: Anterior Nasal Swab  Result Value Ref Range Status   SARS Coronavirus 2 by RT PCR NEGATIVE NEGATIVE Final    Comment: (NOTE) SARS-CoV-2 target nucleic acids are NOT DETECTED.  The SARS-CoV-2 RNA is generally detectable in upper respiratory specimens during the acute phase of infection. The lowest concentration of SARS-CoV-2 viral copies this assay can detect is 138 copies/mL. A negative result does not preclude SARS-Cov-2 infection and should not be used as the sole basis for treatment or  other patient management decisions. A negative result may occur with  improper specimen collection/handling, submission of specimen other than nasopharyngeal swab, presence of viral mutation(s) within the areas targeted by this assay, and inadequate number of viral copies(<138 copies/mL). A negative result must be combined with clinical observations, patient history, and epidemiological information. The expected result is Negative.  Fact Sheet for Patients:  BloggerCourse.com  Fact Sheet for Healthcare Providers:  SeriousBroker.it  This test is no t yet approved or cleared by the [place]  FDA and  has been authorized for detection and/or  diagnosis of SARS-CoV-2 by FDA under an Emergency Use Authorization (EUA). This EUA will remain  in effect (meaning this test can be used) for the duration of the COVID-19 declaration under Section 564(b)(1) of the Act, 21 U.S.C.section 360bbb-3(b)(1), unless the authorization is terminated  or revoked sooner.       Influenza A by PCR NEGATIVE NEGATIVE Final   Influenza B by PCR NEGATIVE NEGATIVE Final    Comment: (NOTE) The Xpert Xpress SARS-CoV-2/FLU/RSV plus assay is intended as an aid in the diagnosis of influenza from Nasopharyngeal swab specimens and should not be used as a sole basis for treatment. Nasal washings and aspirates are unacceptable for Xpert Xpress SARS-CoV-2/FLU/RSV testing.  Fact Sheet for Patients: BloggerCourse.com  Fact Sheet for Healthcare Providers: SeriousBroker.it  This test is not yet approved or cleared by the [place]  FDA and has been authorized for detection and/or diagnosis of SARS-CoV-2 by FDA under an Emergency Use Authorization (EUA). This EUA will remain in effect (meaning this test can be used) for the duration of the COVID-19 declaration under Section 564(b)(1) of the Act, 21 U.S.C. section 360bbb-3(b)(1), unless the authorization is terminated or revoked.     Resp Syncytial Virus by PCR NEGATIVE NEGATIVE Final    Comment: (NOTE) Fact Sheet for Patients: BloggerCourse.com  Fact Sheet for Healthcare Providers: SeriousBroker.it  This test is not yet approved or cleared by the [place]  FDA and has been authorized for detection and/or diagnosis of SARS-CoV-2 by FDA under an Emergency Use Authorization (EUA). This EUA will remain in effect (meaning this test can be used) for the duration of the COVID-19 declaration under Section 564(b)(1) of the Act, 21 U.S.C. section 360bbb-3(b)(1), unless the authorization is terminated  or revoked.  Performed at [facility], [address] [place], [place] [address]      Labs: BNP (last 3 results) No results for input(s): BNP in the last 8760 hours. Basic Metabolic Panel: Recent Labs  Lab 08/18/23 2254 08/20/23 0950 08/21/23 0413  NA 141 140 137  K 4.2 3.4* 3.3*  CL 103 100 103  CO2 23 24 25   GLUCOSE 114* 111* 106*  BUN 11 14 15   CREATININE 0.91 0.84 0.89  CALCIUM 10.4* 10.7* 8.8*   Liver Function Tests: Recent Labs  Lab 08/18/23 2254 08/20/23 0950 08/21/23 0413  AST 25 42* 28  ALT 25 45* 38  ALKPHOS 73 71 47  BILITOT 0.6 0.8 1.2  PROT 8.1 8.3* 6.8  ALBUMIN 5.1* 5.1* 3.9   Recent Labs  Lab 08/18/23 2254 08/20/23 0950  LIPASE 16 18   No results for input(s): AMMONIA in the last 168 hours. CBC: Recent Labs  Lab 08/18/23 2254 08/20/23 0950 08/21/23 0413  WBC 12.3* 14.6* 12.3*  NEUTROABS  --  11.4*  --   HGB 15.7 16.6 14.1  HCT 45.2 46.7 42.7  MCV 90.9 89.3 94.5  PLT 270 291 242   Cardiac Enzymes:  No results for input(s): CKTOTAL, CKMB, CKMBINDEX, TROPONINI in the last 168 hours. BNP: Invalid input(s): POCBNP CBG: No results for input(s): GLUCAP in the last 168 hours. D-Dimer No results for input(s): DDIMER in the last 72 hours. Hgb A1c No results for input(s): HGBA1C in the last 72 hours. Lipid Profile No results for input(s): CHOL, HDL, LDLCALC, TRIG, CHOLHDL, LDLDIRECT in the last 72 hours. Thyroid function studies No results for input(s): TSH, T4TOTAL, T3FREE, THYROIDAB in the last 72 hours.  Invalid input(s): FREET3 Anemia work up No results for input(s): VITAMINB12, FOLATE, FERRITIN, TIBC, IRON, RETICCTPCT in the last 72 hours. Urinalysis    Component Value Date/Time   COLORURINE YELLOW 08/20/2023 0933   APPEARANCEUR CLEAR 08/20/2023 0933   LABSPEC 1.034 (H) 08/20/2023 0933   PHURINE 6.0 08/20/2023 0933   GLUCOSEU NEGATIVE 08/20/2023 0933    HGBUR NEGATIVE 08/20/2023 0933   BILIRUBINUR NEGATIVE 08/20/2023 0933   KETONESUR NEGATIVE 08/20/2023 0933   PROTEINUR 30 (A) 08/20/2023 0933   NITRITE NEGATIVE 08/20/2023 0933   LEUKOCYTESUR NEGATIVE 08/20/2023 0933   Sepsis Labs Recent Labs  Lab 08/18/23 2254 08/20/23 0950 08/21/23 0413  WBC 12.3* 14.6* 12.3*   Microbiology Recent Results (from the past 240 hours)  Resp panel by RT-PCR (RSV, Flu A&B, Covid) Anterior Nasal Swab     Status: None   Collection Time: 08/18/23 10:54 PM   Specimen: Anterior Nasal Swab  Result Value Ref Range Status   SARS Coronavirus 2 by RT PCR NEGATIVE NEGATIVE Final    Comment: (NOTE) SARS-CoV-2 target nucleic acids are NOT DETECTED.  The SARS-CoV-2 RNA is generally detectable in upper respiratory specimens during the acute phase of infection. The lowest concentration of SARS-CoV-2 viral copies this assay can detect is 138 copies/mL. A negative result does not preclude SARS-Cov-2 infection and should not be used as the sole basis for treatment or other patient management decisions. A negative result may occur with  improper specimen collection/handling, submission of specimen other than nasopharyngeal swab, presence of viral mutation(s) within the areas targeted by this assay, and inadequate number of viral copies(<138 copies/mL). A negative result must be combined with clinical observations, patient history, and epidemiological information. The expected result is Negative.  Fact Sheet for Patients:  BloggerCourse.com  Fact Sheet for Healthcare Providers:  SeriousBroker.it  This test is no t yet approved or cleared by the [place]  FDA and  has been authorized for detection and/or diagnosis of SARS-CoV-2 by FDA under an Emergency Use Authorization (EUA). This EUA will remain  in effect (meaning this test can be used) for the duration of the COVID-19 declaration under Section  564(b)(1) of the Act, 21 U.S.C.section 360bbb-3(b)(1), unless the authorization is terminated  or revoked sooner.       Influenza A by PCR NEGATIVE NEGATIVE Final   Influenza B by PCR NEGATIVE NEGATIVE Final    Comment: (NOTE) The Xpert Xpress SARS-CoV-2/FLU/RSV plus assay is intended as an aid in the diagnosis of influenza from Nasopharyngeal swab specimens and should not be used as a sole basis for treatment. Nasal washings and aspirates are unacceptable for Xpert Xpress SARS-CoV-2/FLU/RSV testing.  Fact Sheet for Patients: BloggerCourse.com  Fact Sheet for Healthcare Providers: SeriousBroker.it  This test is not yet approved or cleared by the [place]  FDA and has been authorized for detection and/or diagnosis of SARS-CoV-2 by FDA under an Emergency Use Authorization (EUA). This EUA will remain in effect (meaning this test can be used) for the duration of the COVID-19  declaration under Section 564(b)(1) of the Act, 21 U.S.C. section 360bbb-3(b)(1), unless the authorization is terminated or revoked.     Resp Syncytial Virus by PCR NEGATIVE NEGATIVE Final    Comment: (NOTE) Fact Sheet for Patients: BloggerCourse.com  Fact Sheet for Healthcare Providers: SeriousBroker.it  This test is not yet approved or cleared by the [place]  FDA and has been authorized for detection and/or diagnosis of SARS-CoV-2 by FDA under an Emergency Use Authorization (EUA). This EUA will remain in effect (meaning this test can be used) for the duration of the COVID-19 declaration under Section 564(b)(1) of the Act, 21 U.S.C. section 360bbb-3(b)(1), unless the authorization is terminated or revoked.  Performed at [facility], [address] [place], [place] [address]      Time coordinating discharge: Over 30 minutes  SIGNED:   Elsie JAYSON Montclair, DO [facility] 08/22/2023, 12:25 PM Pager   If 7PM-7AM, please contact night-coverage [email or web address]

## 2023-08-22 NOTE — Progress Notes (Addendum)
 Discharge instructions were reviewed with the patient. He denied questions or concerns at this time. IV removed, site is clean dry and intact. Medications were delivered to the patient from the [facility] outpatient pharmacy.

## 2023-08-23 ENCOUNTER — Other Ambulatory Visit: Payer: Self-pay

## 2023-08-23 ENCOUNTER — Emergency Department (HOSPITAL_COMMUNITY)
Admission: EM | Admit: 2023-08-23 | Discharge: 2023-08-23 | Disposition: A | Discharge status: Home / Self Care | Source: Home / Self Care | Attending: Emergency Medicine | Admitting: Emergency Medicine

## 2023-08-23 ENCOUNTER — Emergency Department (HOSPITAL_COMMUNITY)

## 2023-08-23 ENCOUNTER — Inpatient Hospital Stay (HOSPITAL_COMMUNITY)
Admission: EM | Admit: 2023-08-23 | Discharge: 2023-08-26 | DRG: 391 | Disposition: A | Discharge status: Home / Self Care | Attending: Internal Medicine | Admitting: Internal Medicine

## 2023-08-23 DIAGNOSIS — K3184 Gastroparesis: Secondary | ICD-10-CM | POA: Diagnosis present

## 2023-08-23 DIAGNOSIS — K269 Duodenal ulcer, unspecified as acute or chronic, without hemorrhage or perforation: Secondary | ICD-10-CM | POA: Insufficient documentation

## 2023-08-23 DIAGNOSIS — Z79899 Other long term (current) drug therapy: Secondary | ICD-10-CM

## 2023-08-23 DIAGNOSIS — Z8249 Family history of ischemic heart disease and other diseases of the circulatory system: Secondary | ICD-10-CM

## 2023-08-23 DIAGNOSIS — E66811 Obesity, class 1: Secondary | ICD-10-CM | POA: Diagnosis present

## 2023-08-23 DIAGNOSIS — M79671 Pain in right foot: Secondary | ICD-10-CM | POA: Diagnosis present

## 2023-08-23 DIAGNOSIS — K2970 Gastritis, unspecified, without bleeding: Secondary | ICD-10-CM | POA: Diagnosis present

## 2023-08-23 DIAGNOSIS — R509 Fever, unspecified: Secondary | ICD-10-CM | POA: Insufficient documentation

## 2023-08-23 DIAGNOSIS — K920 Hematemesis: Secondary | ICD-10-CM

## 2023-08-23 DIAGNOSIS — R519 Headache, unspecified: Secondary | ICD-10-CM | POA: Insufficient documentation

## 2023-08-23 DIAGNOSIS — T39395A Adverse effect of other nonsteroidal anti-inflammatory drugs [NSAID], initial encounter: Secondary | ICD-10-CM | POA: Diagnosis present

## 2023-08-23 DIAGNOSIS — A059 Bacterial foodborne intoxication, unspecified: Secondary | ICD-10-CM | POA: Diagnosis present

## 2023-08-23 DIAGNOSIS — K264 Chronic or unspecified duodenal ulcer with hemorrhage: Secondary | ICD-10-CM | POA: Diagnosis present

## 2023-08-23 DIAGNOSIS — A084 Viral intestinal infection, unspecified: Secondary | ICD-10-CM | POA: Diagnosis present

## 2023-08-23 DIAGNOSIS — R1084 Generalized abdominal pain: Secondary | ICD-10-CM | POA: Insufficient documentation

## 2023-08-23 DIAGNOSIS — R112 Nausea with vomiting, unspecified: Secondary | ICD-10-CM | POA: Insufficient documentation

## 2023-08-23 DIAGNOSIS — E876 Hypokalemia: Secondary | ICD-10-CM | POA: Diagnosis present

## 2023-08-23 DIAGNOSIS — Z6829 Body mass index (BMI) 29.0-29.9, adult: Secondary | ICD-10-CM | POA: Diagnosis not present

## 2023-08-23 DIAGNOSIS — E875 Hyperkalemia: Secondary | ICD-10-CM | POA: Insufficient documentation

## 2023-08-23 DIAGNOSIS — E66813 Obesity, class 3: Secondary | ICD-10-CM | POA: Diagnosis not present

## 2023-08-23 DIAGNOSIS — Z6830 Body mass index (BMI) 30.0-30.9, adult: Secondary | ICD-10-CM

## 2023-08-23 DIAGNOSIS — F909 Attention-deficit hyperactivity disorder, unspecified type: Secondary | ICD-10-CM | POA: Diagnosis present

## 2023-08-23 DIAGNOSIS — F17290 Nicotine dependence, other tobacco product, uncomplicated: Secondary | ICD-10-CM | POA: Diagnosis present

## 2023-08-23 LAB — LIPASE, BLOOD
Lipase: 32 U/L (ref 11–51)
Lipase: 34 U/L (ref 11–51)

## 2023-08-23 LAB — CBC WITH DIFFERENTIAL/PLATELET
Abs Immature Granulocytes: 0.04 K/uL (ref 0.00–0.07)
Abs Immature Granulocytes: 0.03 K/uL (ref 0.00–0.07)
Basophils Absolute: 0 K/uL (ref 0.0–0.1)
Basophils Absolute: 0 K/uL (ref 0.0–0.1)
Basophils Relative: 0 %
Basophils Relative: 0 %
Eosinophils Absolute: 0 K/uL (ref 0.0–0.5)
Eosinophils Absolute: 0 K/uL (ref 0.0–0.5)
Eosinophils Relative: 0 %
Eosinophils Relative: 0 %
HCT: 42.6 % (ref 39.0–52.0)
HCT: 41.3 % (ref 39.0–52.0)
Hemoglobin: 14.6 g/dL (ref 13.0–17.0)
Hemoglobin: 14.4 g/dL (ref 13.0–17.0)
Immature Granulocytes: 0 %
Immature Granulocytes: 0 %
Lymphocytes Relative: 23 %
Lymphocytes Relative: 25 %
Lymphs Abs: 2.1 K/uL (ref 0.7–4.0)
Lymphs Abs: 2.3 K/uL (ref 0.7–4.0)
MCH: 30.7 pg (ref 26.0–34.0)
MCH: 31.6 pg (ref 26.0–34.0)
MCHC: 34.3 g/dL (ref 30.0–36.0)
MCHC: 34.9 g/dL (ref 30.0–36.0)
MCV: 89.7 fL (ref 80.0–100.0)
MCV: 90.6 fL (ref 80.0–100.0)
Monocytes Absolute: 0.7 K/uL (ref 0.1–1.0)
Monocytes Absolute: 0.6 K/uL (ref 0.1–1.0)
Monocytes Relative: 8 %
Monocytes Relative: 7 %
Neutro Abs: 6.2 K/uL (ref 1.7–7.7)
Neutro Abs: 6.2 K/uL (ref 1.7–7.7)
Neutrophils Relative %: 69 %
Neutrophils Relative %: 68 %
Platelets: 244 K/uL (ref 150–400)
Platelets: 261 K/uL (ref 150–400)
RBC: 4.75 MIL/uL (ref 4.22–5.81)
RBC: 4.56 MIL/uL (ref 4.22–5.81)
RDW: 11.9 % (ref 11.5–15.5)
RDW: 12 % (ref 11.5–15.5)
WBC: 9.1 K/uL (ref 4.0–10.5)
WBC: 9.2 K/uL (ref 4.0–10.5)
nRBC: 0 % (ref 0.0–0.2)
nRBC: 0 % (ref 0.0–0.2)

## 2023-08-23 LAB — COMPREHENSIVE METABOLIC PANEL WITH GFR
ALT: 58 U/L — ABNORMAL HIGH (ref 0–44)
ALT: 57 U/L — ABNORMAL HIGH (ref 0–44)
AST: 29 U/L (ref 15–41)
AST: 25 U/L (ref 15–41)
Albumin: 4.3 g/dL (ref 3.5–5.0)
Albumin: 4 g/dL (ref 3.5–5.0)
Alkaline Phosphatase: 51 U/L (ref 38–126)
Alkaline Phosphatase: 53 U/L (ref 38–126)
Anion gap: 12 (ref 5–15)
Anion gap: 12 (ref 5–15)
BUN: 10 mg/dL (ref 6–20)
BUN: 9 mg/dL (ref 6–20)
CO2: 25 mmol/L (ref 22–32)
CO2: 24 mmol/L (ref 22–32)
Calcium: 9.2 mg/dL (ref 8.9–10.3)
Calcium: 9.2 mg/dL (ref 8.9–10.3)
Chloride: 101 mmol/L (ref 98–111)
Chloride: 103 mmol/L (ref 98–111)
Creatinine, Ser: 0.76 mg/dL (ref 0.61–1.24)
Creatinine, Ser: 0.73 mg/dL (ref 0.61–1.24)
GFR, Estimated: >60 mL/min (ref >60)
GFR, Estimated: >60 mL/min (ref >60)
Glucose, Bld: 101 mg/dL — ABNORMAL HIGH (ref 70–99)
Glucose, Bld: 92 mg/dL (ref 70–99)
Potassium: 3.3 mmol/L — ABNORMAL LOW (ref 3.5–5.1)
Potassium: 3.1 mmol/L — ABNORMAL LOW (ref 3.5–5.1)
Sodium: 138 mmol/L (ref 135–145)
Sodium: 139 mmol/L (ref 135–145)
Total Bilirubin: 1.1 mg/dL (ref 0.0–1.2)
Total Bilirubin: 1.2 mg/dL (ref 0.0–1.2)
Total Protein: 7.1 g/dL (ref 6.5–8.1)
Total Protein: 7.2 g/dL (ref 6.5–8.1)

## 2023-08-23 LAB — URINALYSIS, ROUTINE W REFLEX MICROSCOPIC
Bilirubin Urine: NEGATIVE
Glucose, UA: NEGATIVE mg/dL
Hgb urine dipstick: NEGATIVE
Ketones, ur: NEGATIVE mg/dL
Leukocytes,Ua: NEGATIVE
Nitrite: NEGATIVE
Protein, ur: NEGATIVE mg/dL
Specific Gravity, Urine: 1.019 (ref 1.005–1.030)
pH: 7 (ref 5.0–8.0)

## 2023-08-23 LAB — MAGNESIUM: Magnesium: 2.5 mg/dL — ABNORMAL HIGH (ref 1.7–2.4)

## 2023-08-23 MED ORDER — LIDOCAINE VISCOUS HCL 2 % MT SOLN
15.0000 mL | Freq: Once | OROMUCOSAL | Status: AC
  Administered 2023-08-23: 15 mL via OROMUCOSAL
  Filled 2023-08-23: qty 15

## 2023-08-23 MED ORDER — SODIUM CHLORIDE 0.9 % IV BOLUS
1000.0000 mL | Freq: Once | INTRAVENOUS | Status: AC
  Administered 2023-08-23: 1000 mL via INTRAVENOUS

## 2023-08-23 MED ORDER — ACETAMINOPHEN 500 MG PO TABS
1000.0000 mg | ORAL_TABLET | Freq: Once | ORAL | Status: DC
Start: 2023-08-23 — End: 2023-08-23
  Filled 2023-08-23: qty 2

## 2023-08-23 MED ORDER — ALUM & MAG HYDROXIDE-SIMETH 200-200-20 MG/5ML PO SUSP
30.0000 mL | Freq: Once | ORAL | Status: AC
  Administered 2023-08-23: 30 mL via ORAL
  Filled 2023-08-23: qty 30

## 2023-08-23 MED ORDER — POTASSIUM CHLORIDE 10 MEQ/100ML IV SOLN
10.0000 meq | INTRAVENOUS | Status: AC
  Administered 2023-08-23 (×2): 10 meq via INTRAVENOUS
  Filled 2023-08-23 (×2): qty 100

## 2023-08-23 MED ORDER — ONDANSETRON HCL 4 MG PO TABS: 4.0000 mg | ORAL_TABLET | Freq: Four times a day (QID) | ORAL | Status: DC | PRN

## 2023-08-23 MED ORDER — ALBUTEROL SULFATE (2.5 MG/3ML) 0.083% IN NEBU: 2.5000 mg | INHALATION_SOLUTION | RESPIRATORY_TRACT | Status: DC | PRN

## 2023-08-23 MED ORDER — FAMOTIDINE IN NACL 20-0.9 MG/50ML-% IV SOLN
20.0000 mg | Freq: Once | INTRAVENOUS | Status: AC
  Administered 2023-08-23: 20 mg via INTRAVENOUS
  Filled 2023-08-23: qty 50

## 2023-08-23 MED ORDER — SUCRALFATE 1 G PO TABS
1.0000 g | ORAL_TABLET | Freq: Once | ORAL | Status: AC
  Administered 2023-08-23: 1 g via ORAL
  Filled 2023-08-23: qty 1

## 2023-08-23 MED ORDER — ONDANSETRON HCL 4 MG/2ML IJ SOLN
4.0000 mg | Freq: Four times a day (QID) | INTRAMUSCULAR | Status: DC | PRN
Start: 2023-08-23 — End: 2023-08-26
  Administered 2023-08-23 – 2023-08-26 (×6): 4 mg via INTRAVENOUS
  Filled 2023-08-23 (×6): qty 2

## 2023-08-23 MED ORDER — SODIUM CHLORIDE 0.9 % IV SOLN: INTRAVENOUS | Status: AC

## 2023-08-23 MED ORDER — PANTOPRAZOLE SODIUM 40 MG IV SOLR
40.0000 mg | Freq: Two times a day (BID) | INTRAVENOUS | Status: DC
  Administered 2023-08-23 – 2023-08-26 (×6): 40 mg via INTRAVENOUS
  Filled 2023-08-23 (×6): qty 10

## 2023-08-23 MED ORDER — METOCLOPRAMIDE HCL 10 MG/10ML PO SOLN
10.0000 mg | Freq: Three times a day (TID) | ORAL | Status: DC
  Administered 2023-08-24 – 2023-08-26 (×8): 10 mg via ORAL
  Filled 2023-08-23 (×9): qty 10

## 2023-08-23 MED ORDER — METOCLOPRAMIDE HCL 5 MG/ML IJ SOLN
10.0000 mg | Freq: Once | INTRAMUSCULAR | Status: AC
  Administered 2023-08-23: 10 mg via INTRAVENOUS
  Filled 2023-08-23: qty 2

## 2023-08-23 MED ORDER — DIPHENHYDRAMINE HCL 50 MG/ML IJ SOLN
12.5000 mg | Freq: Once | INTRAMUSCULAR | Status: AC
  Administered 2023-08-23: 12.5 mg via INTRAVENOUS
  Filled 2023-08-23: qty 1

## 2023-08-23 MED ORDER — FENTANYL CITRATE PF 50 MCG/ML IJ SOSY
12.5000 ug | PREFILLED_SYRINGE | INTRAMUSCULAR | Status: DC | PRN
  Administered 2023-08-23: 50 ug via INTRAVENOUS
  Filled 2023-08-23: qty 1

## 2023-08-23 NOTE — ED Provider Notes (Addendum)
 [place] EMERGENCY DEPARTMENT AT [facility] Provider Note   CSN: [id number] Arrival date & time: 08/23/23  1713     Patient presents with: Emesis   [name] is a 33 y.o. male presenting back to ED with complaint of nausea/vomiting.  Patient was discharged from hospital yesterday for intractable n/v, came back to ED this morning and was discharged after CT showing no emergent findings.  He says he took zofran  and compazine  and took a nap, then woke up vomiting forcefully, including with chunks of blood.  Reports hx of gastric upset/reflux with certain foods, but does not take GI medications.   Not on A/C.  Continues to have mild bitemporal headache.  Had negative head CT while in ED today.   HPI     Prior to Admission medications   Medication Sig Start Date End Date Taking? Authorizing Provider  amphetamine-dextroamphetamine (ADDERALL) 30 MG tablet Take 1 tablet by mouth 2 (two) times daily. 07/20/23   [provider]  ondansetron  (ZOFRAN ) 4 MG tablet Take 1 tablet (4 mg total) by mouth daily as needed for nausea or vomiting. 08/22/23 08/21/24  Lue Elsie BROCKS, MD  pantoprazole  (PROTONIX ) 40 MG tablet Take 1 tablet (40 mg total) by mouth 2 (two) times daily. 08/22/23 09/21/23  Lue Elsie BROCKS, MD  promethazine  (PHENERGAN ) 25 MG tablet Take 1 tablet (25 mg total) by mouth every 6 (six) hours as needed for nausea or vomiting. 08/22/23   Lue Elsie BROCKS, MD    Allergies: Patient has no known allergies.    Review of Systems  Updated Vital Signs BP (!) 160/95 (BP Location: Right Arm)   Pulse 84   Temp 98.5 F (36.9 C) (Oral)   Resp 18   Ht 6' 3 (1.905 m)   Wt 108.9 kg   SpO2 96%   BMI 30.00 kg/m   Physical Exam Constitutional:      General: He is not in acute distress. HENT:     Head: Normocephalic and atraumatic.  Eyes:     Conjunctiva/sclera: Conjunctivae normal.     Pupils: Pupils are equal, round, and reactive to light.   Cardiovascular:     Rate and Rhythm: Normal rate and regular rhythm.  Pulmonary:     Effort: Pulmonary effort is normal. No respiratory distress.  Abdominal:     General: There is no distension.     Tenderness: There is abdominal tenderness.  Skin:    General: Skin is warm and dry.  Neurological:     General: No focal deficit present.     Mental Status: He is alert. Mental status is at baseline.  Psychiatric:        Mood and Affect: Mood normal.        Behavior: Behavior normal.     (all labs ordered are listed, but only abnormal results are displayed) Labs Reviewed  COMPREHENSIVE METABOLIC PANEL WITH GFR - Abnormal; Notable for the following components:      Result Value   Potassium 3.1 (*)    ALT 57 (*)    All other components within normal limits  CBC WITH DIFFERENTIAL/PLATELET  LIPASE, BLOOD  MAGNESIUM    EKG: None  Radiology: CT ABDOMEN PELVIS WO CONTRAST Result Date: 08/23/2023 CLINICAL DATA:  Nausea with nonlocalized abdominal pain. EXAM: CT ABDOMEN AND PELVIS WITHOUT CONTRAST TECHNIQUE: Multidetector CT imaging of the abdomen and pelvis was performed following the standard protocol without IV contrast. RADIATION DOSE REDUCTION: This exam was performed according to the  departmental dose-optimization program which includes automated exposure control, adjustment of the mA and/or kV according to patient size and/or use of iterative reconstruction technique. COMPARISON:  08/20/2023 FINDINGS: Lower chest: 2 mm right lower lobe nodule on 44/6 is stable in the interval since the most recent comparison study and also comparing back to an exam from 10/25/2017, consistent with benign etiology. No followup imaging is recommended. Hepatobiliary: No suspicious focal abnormality in the liver on this study without intravenous contrast. Layering high attenuation material in the lumen of the gallbladder may reflect sludge or some vicarious excretion of contrast from previous recent CT  imaging. No intrahepatic or extrahepatic biliary dilation. Pancreas: No focal mass lesion. No dilatation of the main duct. No intraparenchymal cyst. No peripancreatic edema. Spleen: No splenomegaly. No suspicious focal mass lesion. Adrenals/Urinary Tract: No adrenal nodule or mass. Kidneys unremarkable. No evidence for hydroureter. The urinary bladder appears normal for the degree of distention. Stomach/Bowel: Stomach is unremarkable. No gastric wall thickening. No evidence of outlet obstruction. Duodenum is normally positioned as is the ligament of Treitz. No small bowel wall thickening. No small bowel dilatation. The terminal ileum is normal. The appendix is normal. No gross colonic mass. No colonic wall thickening. Vascular/Lymphatic: No abdominal aortic aneurysm. No abdominal aortic atherosclerotic calcification. There is no gastrohepatic or hepatoduodenal ligament lymphadenopathy. No retroperitoneal or mesenteric lymphadenopathy. No pelvic sidewall lymphadenopathy. Reproductive: The prostate gland and seminal vesicles are unremarkable. Other: No intraperitoneal free fluid. Musculoskeletal: No worrisome lytic or sclerotic osseous abnormality. IMPRESSION: 1. No acute findings in the abdomen or pelvis. Specifically, no findings to explain the patient's history of nausea and abdominal pain. 2. Layering high attenuation material in the lumen of the gallbladder may reflect sludge or vicarious excretion of contrast from previous recent CT imaging. Electronically Signed   By: Camellia Candle M.D.   On: 08/23/2023 10:52   CT HEAD WO CONTRAST Result Date: 08/23/2023 EXAM: CT HEAD WITHOUT CONTRAST 08/23/2023 10:21:16 AM TECHNIQUE: CT of the head was performed without the administration of intravenous contrast. Automated exposure control, iterative reconstruction, and/or weight based adjustment of the mA/kV was utilized to reduce the radiation dose to as low as reasonably achievable. COMPARISON: CT head without contrast  04/20/2014. CLINICAL HISTORY: Headache, increasing frequency or severity. Pt presents to the ED for severe headache and nausea that has been present since yesterday when he was discharged from hospital. Pt was recently admitted for gastritis. Pt states that he was told to report to ED if his symptoms do improve. Pt has not been able sleep or eat because of the nausea and pain. FINDINGS: BRAIN AND VENTRICLES: No acute hemorrhage. Gray-white differentiation is preserved. No hydrocephalus. No extra-axial collection. No mass effect or midline shift. ORBITS: No acute abnormality. SINUSES: No acute abnormality. SOFT TISSUES AND SKULL: No acute soft tissue abnormality. No skull fracture. IMPRESSION: 1. No acute intracranial abnormality. Electronically signed by: Lonni Necessary MD 08/23/2023 10:42 AM EDT RP Workstation: [id number]     .Critical Care  Performed by: Cottie Donnice PARAS, MD Authorized by: Cottie Donnice PARAS, MD   Critical care provider statement:    Critical care time (minutes):  30   Critical care time was exclusive of:  Separately billable procedures and treating other patients   Critical care was necessary to treat or prevent imminent or life-threatening deterioration of the following conditions:  Circulatory failure   Critical care was time spent personally by me on the following activities:  Ordering and performing treatments and interventions, ordering and  review of laboratory studies, ordering and review of radiographic studies, pulse oximetry, review of old charts, examination of patient and evaluation of patient's response to treatment Comments:     Hypokalemia management    Medications Ordered in the ED  potassium chloride  10 mEq in 100 mL IVPB (10 mEq Intravenous New Bag/Given 08/23/23 2028)  alum & mag hydroxide-simeth (MAALOX/MYLANTA) 200-200-20 MG/5ML suspension 30 mL (30 mLs Oral Given 08/23/23 1825)  lidocaine  (XYLOCAINE ) 2 % viscous mouth solution 15 mL (15 mLs Mouth/Throat  Given 08/23/23 1825)  famotidine  (PEPCID ) IVPB 20 mg premix (0 mg Intravenous Stopped 08/23/23 1909)  sucralfate  (CARAFATE ) tablet 1 g (1 g Oral Given 08/23/23 1825)  metoCLOPramide  (REGLAN ) injection 10 mg (10 mg Intravenous Given 08/23/23 2018)  sodium chloride  0.9 % bolus 1,000 mL (1,000 mLs Intravenous New Bag/Given 08/23/23 2026)    Clinical Course as of 08/23/23 2031  Sun Aug 23, 2023  2011 Pt still vomiting - will give reglan , potassium, admit to hospital [MT]  2031 Admitted to hospitalist [MT]    Clinical Course User Index [MT] Cottie Donnice PARAS, MD                                 Medical Decision Making Amount and/or Complexity of Data Reviewed Labs: ordered.  Risk OTC drugs. Prescription drug management. Decision regarding hospitalization.   This patient presents to the ED with concern for hematemesis. This involves an extensive number of treatment options, and is a complaint that carries with it a high risk of complications and morbidity.  The differential diagnosis includes mallory weiss tear vs peptic ulcer vs gastritis vs other  Additional history obtained from family  External records from outside source obtained and reviewed including prior CT scan's this week and hospital discharge summary  I ordered and personally interpreted labs.  The pertinent results include:  K 3.1.  WBC wnl.  Hgb wnl. LFT's and lipase wnl.  Test Considered:  No indication for repeat CT imaging or RUQ ultrasound - doubt cholecystitis.  Doubt boorhave syndrome or PTX.  I ordered medication including GI medications, IV K, fluids  I have reviewed the patients home medicines and have made adjustments as needed  After the interventions noted above, I reevaluated the patient and found that they have: worsened  Social Determinants of Health:patient does not smoke marijuana or drink alcohol - doubt CHS  Disposition:  After consideration of the diagnostic results and the patients response to  treatment, I feel that the patent would benefit from medical admission.  Given the extent and persistence of patient's symptoms, he may benefit from GI consult in the hospital.      Final diagnoses:  Hypokalemia  Hematemesis with nausea    ED Discharge Orders     None          Jada Kuhnert, Donnice PARAS, MD 08/23/23 2016    Cottie Donnice PARAS, MD 08/23/23 2031

## 2023-08-23 NOTE — Discharge Instructions (Signed)
 Evaluation today was overall reassuring.  Please continue taking antinausea and vomiting medications as prescribed to you by the hospital team.  If you have persistent vomiting, worsening abdominal pain, development of fever cannot tolerate fluid intake or any other concerning symptom please return to the ED for further evaluation.

## 2023-08-23 NOTE — H&P (Addendum)
 History and Physical    [name] FMW:[id number] DOB: 04/29/1990 DOA: 08/23/2023  PCP: Sun, Vyvyan, MD  Patient coming from: home  I have personally briefly reviewed patient's old medical records in [facility]  Chief Complaint: intractable n/v  HPI: [name] is a 32 y.o. male with medical history significant of  ADHD whooping cough, who presents to ED with intractable n/v. Of note patient has interim history of admission for the same 7/24-7/26 at the initial onset of his symptoms. At that time history notes patient symptoms began after eating shrimp alfredo pasta dish that was re-heated. Patient was admitted  with diagnosis of gastroenteritis due to food borne illness. AT that time he was also noted to have + FOBT but noted brown stools. He at that time endorsed increase nsaid use after recent ankle surgery.Patient treated supportively with ppi , anti-emetics and was discharged.  Patient s/p discharge had recurrent symptoms and returned to ED AM of 7/27. He was evaluated has repeat CT scan which noted no acute findings. Patient was treated supportively and discharged home. However this pm patient returns with continues symptoms despite oral medication given at home. He also notes episode of hematemesis.Patient has since has emesis in ED w/o note of hematemesis.  ED Course:  In ED  Vitals Afeb, bp 160/95, hr 84, rr 18, sat 96%  Wbc 9.2, hgb 14.4, plt 261 Na 139, K 3.1, cr 0.73 alt 57 Lipase 34  Tx carfate, lidocaine , mylanta, pepcid , magnesium, reglan  ns 1L, potassium 10 meq x 1  Patient notes improvement with reglan  , patient is admitted for observation as well as further evaluation due to refractory symptoms.  Review of Systems: As per HPI otherwise 10 point review of systems negative.   Past Medical History:  Diagnosis Date   ADHD    Whooping cough     Past Surgical History:  Procedure Laterality Date   FOOT SURGERY  06/2023     reports that he has been  smoking e-cigarettes. He has never used smokeless tobacco. He reports current alcohol use. He reports that he does not use drugs.  No Known Allergies  Family History  Problem Relation Age of Onset   Hypertension Mother     Prior to Admission medications   Medication Sig Start Date End Date Taking? Authorizing Provider  amphetamine-dextroamphetamine (ADDERALL) 30 MG tablet Take 1 tablet by mouth 2 (two) times daily. 07/20/23   [provider]  ondansetron  (ZOFRAN ) 4 MG tablet Take 1 tablet (4 mg total) by mouth daily as needed for nausea or vomiting. 08/22/23 08/21/24  Lue Elsie BROCKS, MD  pantoprazole  (PROTONIX ) 40 MG tablet Take 1 tablet (40 mg total) by mouth 2 (two) times daily. 08/22/23 09/21/23  Lue Elsie BROCKS, MD  promethazine  (PHENERGAN ) 25 MG tablet Take 1 tablet (25 mg total) by mouth every 6 (six) hours as needed for nausea or vomiting. 08/22/23   Lue Elsie BROCKS, MD    Physical Exam: Vitals:   08/23/23 1719 08/23/23 2031  BP: (!) 160/95 133/82  Pulse: 84 60  Resp: 18 20  Temp: 98.5 F (36.9 C) 98.4 F (36.9 C)  TempSrc: Oral Oral  SpO2: 96% 99%  Weight: 108.9 kg   Height: 6' 3 (1.905 m)     Constitutional: NAD, calm, comfortable Vitals:   08/23/23 1719 08/23/23 2031  BP: (!) 160/95 133/82  Pulse: 84 60  Resp: 18 20  Temp: 98.5 F (36.9 C) 98.4 F (36.9 C)  TempSrc: Oral Oral  SpO2: 96% 99%  Weight: 108.9 kg   Height: 6' 3 (1.905 m)    Eyes: PERRL, lids and conjunctivae normal ENMT: Mucous membranes are moist. Posterior pharynx clear of any exudate or lesions.Normal dentition.  Neck: normal, supple, no masses, no thyromegaly Respiratory: clear to auscultation bilaterally, no wheezing, no crackles. Normal respiratory effort. No accessory muscle use.  Cardiovascular: Regular rate and rhythm, no murmurs / rubs / gallops. No extremity edema. 2+ pedal pulses. No carotid bruits.  Abdomen: epigastric tenderness, no masses palpated. No  hepatosplenomegaly. Bowel sounds positive.  Musculoskeletal: no clubbing / cyanosis. No joint deformity upper and lower extremities. Good ROM, no contractures. Normal muscle tone.  Skin: no rashes, lesions, ulcers. No induration Neurologic: CN grossly intact. Sensation intact, . Strength 5/5 in all 4.  Psychiatric: Normal judgment and insight. Alert and oriented x 3. Normal mood.    Labs on Admission: I have personally reviewed following labs and imaging studies  CBC: Recent Labs  Lab 08/20/23 0950 08/21/23 0413 08/22/23 1223 08/23/23 0937 08/23/23 1838  WBC 14.6* 12.3* 8.5 9.1 9.2  NEUTROABS 11.4*  --   --  6.2 6.2  HGB 16.6 14.1 13.5 14.6 14.4  HCT 46.7 42.7 40.0 42.6 41.3  MCV 89.3 94.5 92.4 89.7 90.6  PLT 291 242 221 244 261   Basic Metabolic Panel: Recent Labs  Lab 08/18/23 2254 08/20/23 0950 08/21/23 0413 08/23/23 0937 08/23/23 1838  NA 141 140 137 138 139  K 4.2 3.4* 3.3* 3.3* 3.1*  CL 103 100 103 101 103  CO2 23 24 25 25 24   GLUCOSE 114* 111* 106* 101* 92  BUN 11 14 15 10 9   CREATININE 0.91 0.84 0.89 0.76 0.73  CALCIUM 10.4* 10.7* 8.8* 9.2 9.2   GFR: Estimated Creatinine Clearance: 173.5 mL/min (by C-G formula based on SCr of 0.73 mg/dL). Liver Function Tests: Recent Labs  Lab 08/18/23 2254 08/20/23 0950 08/21/23 0413 08/23/23 0937 08/23/23 1838  AST 25 42* 28 29 25   ALT 25 45* 38 58* 57*  ALKPHOS 73 71 47 51 53  BILITOT 0.6 0.8 1.2 1.1 1.2  PROT 8.1 8.3* 6.8 7.1 7.2  ALBUMIN 5.1* 5.1* 3.9 4.3 4.0   Recent Labs  Lab 08/18/23 2254 08/20/23 0950 08/23/23 0937 08/23/23 1838  LIPASE 16 18 32 34   No results for input(s): AMMONIA in the last 168 hours. Coagulation Profile: No results for input(s): INR, PROTIME in the last 168 hours. Cardiac Enzymes: No results for input(s): CKTOTAL, CKMB, CKMBINDEX, TROPONINI in the last 168 hours. BNP (last 3 results) No results for input(s): PROBNP in the last 8760 hours. HbA1C: No results  for input(s): HGBA1C in the last 72 hours. CBG: No results for input(s): GLUCAP in the last 168 hours. Lipid Profile: No results for input(s): CHOL, HDL, LDLCALC, TRIG, CHOLHDL, LDLDIRECT in the last 72 hours. Thyroid Function Tests: No results for input(s): TSH, T4TOTAL, FREET4, T3FREE, THYROIDAB in the last 72 hours. Anemia Panel: No results for input(s): VITAMINB12, FOLATE, FERRITIN, TIBC, IRON, RETICCTPCT in the last 72 hours. Urine analysis:    Component Value Date/Time   COLORURINE YELLOW 08/23/2023 1059   APPEARANCEUR CLEAR 08/23/2023 1059   LABSPEC 1.019 08/23/2023 1059   PHURINE 7.0 08/23/2023 1059   GLUCOSEU NEGATIVE 08/23/2023 1059   HGBUR NEGATIVE 08/23/2023 1059   BILIRUBINUR NEGATIVE 08/23/2023 1059   KETONESUR NEGATIVE 08/23/2023 1059   PROTEINUR NEGATIVE 08/23/2023 1059   NITRITE NEGATIVE 08/23/2023 1059   LEUKOCYTESUR NEGATIVE 08/23/2023 1059  Radiological Exams on Admission: CT ABDOMEN PELVIS WO CONTRAST Result Date: 08/23/2023 CLINICAL DATA:  Nausea with nonlocalized abdominal pain. EXAM: CT ABDOMEN AND PELVIS WITHOUT CONTRAST TECHNIQUE: Multidetector CT imaging of the abdomen and pelvis was performed following the standard protocol without IV contrast. RADIATION DOSE REDUCTION: This exam was performed according to the departmental dose-optimization program which includes automated exposure control, adjustment of the mA and/or kV according to patient size and/or use of iterative reconstruction technique. COMPARISON:  08/20/2023 FINDINGS: Lower chest: 2 mm right lower lobe nodule on 44/6 is stable in the interval since the most recent comparison study and also comparing back to an exam from 10/25/2017, consistent with benign etiology. No followup imaging is recommended. Hepatobiliary: No suspicious focal abnormality in the liver on this study without intravenous contrast. Layering high attenuation material in the lumen of the  gallbladder may reflect sludge or some vicarious excretion of contrast from previous recent CT imaging. No intrahepatic or extrahepatic biliary dilation. Pancreas: No focal mass lesion. No dilatation of the main duct. No intraparenchymal cyst. No peripancreatic edema. Spleen: No splenomegaly. No suspicious focal mass lesion. Adrenals/Urinary Tract: No adrenal nodule or mass. Kidneys unremarkable. No evidence for hydroureter. The urinary bladder appears normal for the degree of distention. Stomach/Bowel: Stomach is unremarkable. No gastric wall thickening. No evidence of outlet obstruction. Duodenum is normally positioned as is the ligament of Treitz. No small bowel wall thickening. No small bowel dilatation. The terminal ileum is normal. The appendix is normal. No gross colonic mass. No colonic wall thickening. Vascular/Lymphatic: No abdominal aortic aneurysm. No abdominal aortic atherosclerotic calcification. There is no gastrohepatic or hepatoduodenal ligament lymphadenopathy. No retroperitoneal or mesenteric lymphadenopathy. No pelvic sidewall lymphadenopathy. Reproductive: The prostate gland and seminal vesicles are unremarkable. Other: No intraperitoneal free fluid. Musculoskeletal: No worrisome lytic or sclerotic osseous abnormality. IMPRESSION: 1. No acute findings in the abdomen or pelvis. Specifically, no findings to explain the patient's history of nausea and abdominal pain. 2. Layering high attenuation material in the lumen of the gallbladder may reflect sludge or vicarious excretion of contrast from previous recent CT imaging. Electronically Signed   By: Camellia Candle M.D.   On: 08/23/2023 10:52   CT HEAD WO CONTRAST Result Date: 08/23/2023 EXAM: CT HEAD WITHOUT CONTRAST 08/23/2023 10:21:16 AM TECHNIQUE: CT of the head was performed without the administration of intravenous contrast. Automated exposure control, iterative reconstruction, and/or weight based adjustment of the mA/kV was utilized to  reduce the radiation dose to as low as reasonably achievable. COMPARISON: CT head without contrast 04/20/2014. CLINICAL HISTORY: Headache, increasing frequency or severity. Pt presents to the ED for severe headache and nausea that has been present since yesterday when he was discharged from hospital. Pt was recently admitted for gastritis. Pt states that he was told to report to ED if his symptoms do improve. Pt has not been able sleep or eat because of the nausea and pain. FINDINGS: BRAIN AND VENTRICLES: No acute hemorrhage. Gray-white differentiation is preserved. No hydrocephalus. No extra-axial collection. No mass effect or midline shift. ORBITS: No acute abnormality. SINUSES: No acute abnormality. SOFT TISSUES AND SKULL: No acute soft tissue abnormality. No skull fracture. IMPRESSION: 1. No acute intracranial abnormality. Electronically signed by: Lonni Necessary MD 08/23/2023 10:42 AM EDT RP Workstation: [id number]    EKG: Independently reviewed.   Assessment/Plan  Intractable N/v presumed  Post infectious gastroparesis  - in setting of recent diagnosis of acute gastroenteritis due to food borne illness  - admit to tele  -  continue with reglan  tid , prn zofran  - continue with ppi  - supportive care with ivfs  - replete lytes  -nutrition consult  -clear liquids /advance to bland diet as able  - consider further GI evaluation due to refractory symptoms   ? Episode of Hematemesis  - in setting of repeated vomiting  - no hematemesis noted in ED  - continue ppi  - cycle h/h overnight  -clear liquids as patient tolerated , npo midnight in case need for further gi procedure  Hypokaleima - replete prn   ADHD -resume home regimen as able   Obesity Class 1  Mild ALT Elevation  -continue to trend   DVT prophylaxis: scd Code Status: full/ as discussed per patient wishes in event of cardiac arrest  Family Communication: full/ as discussed per patient wishes in event of cardiac  arrest  Disposition Plan: patient  expected to be admitted less than 2 midnights  Consults called: consider GI  Admission status: med tele   Camila DELENA Ned MD [facility]   If 7PM-7AM, please contact night-coverage [email or web address] Password [facility]  08/23/2023, 8:34 PM

## 2023-08-23 NOTE — ED Triage Notes (Signed)
 Pt presents to the ED for severe headache and nausea that has been present since yesterday when he was discharged from hospital. Pt was recently admitted for gastritis. Pt states that he was told to report to ED if his symptoms do improve. Pt has not been able sleep or eat because of the nausea and pain.

## 2023-08-23 NOTE — ED Notes (Signed)
 Pt is tolerating fluid with no episodes of vomiting.    Pt provided discharge instructions and prescription information. Pt was given the opportunity to ask questions and questions were answered.

## 2023-08-23 NOTE — ED Provider Notes (Signed)
 [place] EMERGENCY DEPARTMENT AT [facility] Provider Note   CSN: [id number] Arrival date & time: 08/23/23  9091     Patient presents with: Nausea and Headache  HPI [name] is a 33 y.o. male with recent admission for gastritis presenting for severe headache and nausea.  Headache started this morning.  Pain is primarily in the forehead and behind the eyes.  Endorses photosensitivity and states that patient is having similar symptoms.  Also endorses nausea and states he vomited a couple times as well and now dry heaving.  Reports generalized abdominal pain as well.  States he did have a bowel movement just before he was discharged.  Endorses subjective fever last night.  Denies nuchal rigidity.  Denies urinary symptoms.    Headache      Prior to Admission medications   Medication Sig Start Date End Date Taking? Authorizing Provider  amphetamine-dextroamphetamine (ADDERALL) 30 MG tablet Take 1 tablet by mouth 2 (two) times daily. 07/20/23   [provider]  ondansetron  (ZOFRAN ) 4 MG tablet Take 1 tablet (4 mg total) by mouth daily as needed for nausea or vomiting. 08/22/23 08/21/24  Lue Elsie BROCKS, MD  pantoprazole  (PROTONIX ) 40 MG tablet Take 1 tablet (40 mg total) by mouth 2 (two) times daily. 08/22/23 09/21/23  Lue Elsie BROCKS, MD  promethazine  (PHENERGAN ) 25 MG tablet Take 1 tablet (25 mg total) by mouth every 6 (six) hours as needed for nausea or vomiting. 08/22/23   Lue Elsie BROCKS, MD    Allergies: Patient has no known allergies.    Review of Systems  Neurological:  Positive for headaches.    Updated Vital Signs BP (!) 147/87 (BP Location: Left Arm)   Pulse (!) 51   Temp 98.6 F (37 C) (Oral)   Resp 18   Ht 6' 3 (1.905 m)   Wt 108.9 kg   SpO2 99%   BMI 30.00 kg/m   Physical Exam Vitals and nursing note reviewed.  HENT:     Head: Normocephalic and atraumatic.     Mouth/Throat:     Mouth: Mucous membranes are moist.  Eyes:      General:        Right eye: No discharge.        Left eye: No discharge.     Conjunctiva/sclera: Conjunctivae normal.  Cardiovascular:     Rate and Rhythm: Normal rate and regular rhythm.     Pulses: Normal pulses.     Heart sounds: Normal heart sounds.  Pulmonary:     Effort: Pulmonary effort is normal.     Breath sounds: Normal breath sounds.  Abdominal:     General: Abdomen is flat.     Palpations: Abdomen is soft.     Tenderness: There is abdominal tenderness.  Skin:    General: Skin is warm and dry.  Neurological:     General: No focal deficit present.     Comments: GCS 15. Speech is goal oriented. No deficits appreciated to CN III-XII; symmetric eyebrow raise, no facial drooping, tongue midline. Patient has equal grip strength bilaterally with 5/5 strength against resistance in all major muscle groups bilaterally. Sensation to light touch intact. Patient moves extremities without ataxia. Normal finger-nose-finger. Patient ambulatory with steady gait.  Psychiatric:        Mood and Affect: Mood normal.     (all labs ordered are listed, but only abnormal results are displayed) Labs Reviewed  COMPREHENSIVE METABOLIC PANEL WITH GFR - Abnormal; Notable for the  following components:      Result Value   Potassium 3.3 (*)    Glucose, Bld 101 (*)    ALT 58 (*)    All other components within normal limits  CBC WITH DIFFERENTIAL/PLATELET  LIPASE, BLOOD  URINALYSIS, ROUTINE W REFLEX MICROSCOPIC    EKG: None  Radiology: CT ABDOMEN PELVIS WO CONTRAST Result Date: 08/23/2023 CLINICAL DATA:  Nausea with nonlocalized abdominal pain. EXAM: CT ABDOMEN AND PELVIS WITHOUT CONTRAST TECHNIQUE: Multidetector CT imaging of the abdomen and pelvis was performed following the standard protocol without IV contrast. RADIATION DOSE REDUCTION: This exam was performed according to the departmental dose-optimization program which includes automated exposure control, adjustment of the mA and/or kV  according to patient size and/or use of iterative reconstruction technique. COMPARISON:  08/20/2023 FINDINGS: Lower chest: 2 mm right lower lobe nodule on 44/6 is stable in the interval since the most recent comparison study and also comparing back to an exam from 10/25/2017, consistent with benign etiology. No followup imaging is recommended. Hepatobiliary: No suspicious focal abnormality in the liver on this study without intravenous contrast. Layering high attenuation material in the lumen of the gallbladder may reflect sludge or some vicarious excretion of contrast from previous recent CT imaging. No intrahepatic or extrahepatic biliary dilation. Pancreas: No focal mass lesion. No dilatation of the main duct. No intraparenchymal cyst. No peripancreatic edema. Spleen: No splenomegaly. No suspicious focal mass lesion. Adrenals/Urinary Tract: No adrenal nodule or mass. Kidneys unremarkable. No evidence for hydroureter. The urinary bladder appears normal for the degree of distention. Stomach/Bowel: Stomach is unremarkable. No gastric wall thickening. No evidence of outlet obstruction. Duodenum is normally positioned as is the ligament of Treitz. No small bowel wall thickening. No small bowel dilatation. The terminal ileum is normal. The appendix is normal. No gross colonic mass. No colonic wall thickening. Vascular/Lymphatic: No abdominal aortic aneurysm. No abdominal aortic atherosclerotic calcification. There is no gastrohepatic or hepatoduodenal ligament lymphadenopathy. No retroperitoneal or mesenteric lymphadenopathy. No pelvic sidewall lymphadenopathy. Reproductive: The prostate gland and seminal vesicles are unremarkable. Other: No intraperitoneal free fluid. Musculoskeletal: No worrisome lytic or sclerotic osseous abnormality. IMPRESSION: 1. No acute findings in the abdomen or pelvis. Specifically, no findings to explain the patient's history of nausea and abdominal pain. 2. Layering high attenuation  material in the lumen of the gallbladder may reflect sludge or vicarious excretion of contrast from previous recent CT imaging. Electronically Signed   By: Camellia Candle M.D.   On: 08/23/2023 10:52   CT HEAD WO CONTRAST Result Date: 08/23/2023 EXAM: CT HEAD WITHOUT CONTRAST 08/23/2023 10:21:16 AM TECHNIQUE: CT of the head was performed without the administration of intravenous contrast. Automated exposure control, iterative reconstruction, and/or weight based adjustment of the mA/kV was utilized to reduce the radiation dose to as low as reasonably achievable. COMPARISON: CT head without contrast 04/20/2014. CLINICAL HISTORY: Headache, increasing frequency or severity. Pt presents to the ED for severe headache and nausea that has been present since yesterday when he was discharged from hospital. Pt was recently admitted for gastritis. Pt states that he was told to report to ED if his symptoms do improve. Pt has not been able sleep or eat because of the nausea and pain. FINDINGS: BRAIN AND VENTRICLES: No acute hemorrhage. Gray-white differentiation is preserved. No hydrocephalus. No extra-axial collection. No mass effect or midline shift. ORBITS: No acute abnormality. SINUSES: No acute abnormality. SOFT TISSUES AND SKULL: No acute soft tissue abnormality. No skull fracture. IMPRESSION: 1. No acute intracranial abnormality.  Electronically signed by: Lonni Necessary MD 08/23/2023 10:42 AM EDT RP Workstation: [id number]     Procedures   Medications Ordered in the ED  acetaminophen  (TYLENOL ) tablet 1,000 mg (0 mg Oral Hold 08/23/23 0947)  sodium chloride  0.9 % bolus 1,000 mL (1,000 mLs Intravenous New Bag/Given 08/23/23 0946)  metoCLOPramide  (REGLAN ) injection 10 mg (10 mg Intravenous Given 08/23/23 0947)  diphenhydrAMINE  (BENADRYL ) injection 12.5 mg (12.5 mg Intravenous Given 08/23/23 0947)                                    Medical Decision Making Amount and/or Complexity of Data Reviewed Labs:  ordered. Radiology: ordered.  Risk OTC drugs. Prescription drug management.   Initial Impression and Ddx 33 year old well-appearing male present for abdominal pain, headache and vomiting.  Exam notable for generalized abdominal tenderness but otherwise reassuring.  DDx includes intra-abdominal infection, obstruction, hypertensive emergency, meningitis, stroke, electrolyte derangement, dehydration, other. Patient PMH that increases complexity of ED encounter: recent admission for gastritis   Interpretation of Diagnostics - I independent reviewed and interpreted the labs as followed: Mild hyperkalemia (3.3)  - I independently visualized the following imaging with scope of interpretation limited to determining acute life threatening conditions related to emergency care: CT head and ab/pelvis, which revealed no acute findings  Patient Reassessment and Ultimate Disposition/Management On reassessment, states he was feeling somewhat better overall.  Workup here was reassuring.  Advised continued conservative treatment at home for his nausea and vomiting.  Does not appear clinically dehydrated here and workup is not suggestive associated infection.  Was sent home with Zofran  and promethazine  by the hospital team.  Advised to continue taking these medicines for nausea vomiting at home as needed.  Discussed return precautions.  Discharged good condition.  Patient management required discussion with the following services or consulting groups:  None  Complexity of Problems Addressed Acute complicated illness or Injury  Additional Data Reviewed and Analyzed Further history obtained from: Past medical history and medications listed in the EMR and Prior ED visit notes  Patient Encounter Risk Assessment Consideration of hospitalization      Final diagnoses:  Nausea and vomiting, unspecified vomiting type  Nonintractable headache, unspecified chronicity pattern, unspecified headache type     ED Discharge Orders     None          Lang Norleen POUR, PA-C 08/23/23 1238    Mannie Pac T, DO 08/24/23 [phone]

## 2023-08-23 NOTE — ED Triage Notes (Signed)
 Pt recently discharged from ED. When pt got home he started having episodes of violent vomiting. Pt states that he noticed blood clots mixed in with his vomit. Pt was instructed to return to the ED.

## 2023-08-24 ENCOUNTER — Encounter (HOSPITAL_COMMUNITY): Payer: Self-pay | Admitting: Internal Medicine

## 2023-08-24 DIAGNOSIS — R112 Nausea with vomiting, unspecified: Secondary | ICD-10-CM | POA: Diagnosis not present

## 2023-08-24 LAB — RAPID URINE DRUG SCREEN, HOSP PERFORMED
Amphetamines: NOT DETECTED
Barbiturates: NOT DETECTED
Benzodiazepines: NOT DETECTED
Cocaine: NOT DETECTED
Opiates: POSITIVE — AB
Tetrahydrocannabinol: POSITIVE — AB

## 2023-08-24 LAB — CBC
HCT: 38.9 % — ABNORMAL LOW (ref 39.0–52.0)
Hemoglobin: 13.4 g/dL (ref 13.0–17.0)
MCH: 31.2 pg (ref 26.0–34.0)
MCHC: 34.4 g/dL (ref 30.0–36.0)
MCV: 90.5 fL (ref 80.0–100.0)
Platelets: 235 K/uL (ref 150–400)
RBC: 4.3 MIL/uL (ref 4.22–5.81)
RDW: 12.1 % (ref 11.5–15.5)
WBC: 10.3 K/uL (ref 4.0–10.5)
nRBC: 0 % (ref 0.0–0.2)

## 2023-08-24 LAB — COMPREHENSIVE METABOLIC PANEL WITH GFR
ALT: 45 U/L — ABNORMAL HIGH (ref 0–44)
AST: 17 U/L (ref 15–41)
Albumin: 3.7 g/dL (ref 3.5–5.0)
Alkaline Phosphatase: 46 U/L (ref 38–126)
Anion gap: 10 (ref 5–15)
BUN: 10 mg/dL (ref 6–20)
CO2: 22 mmol/L (ref 22–32)
Calcium: 8.5 mg/dL — ABNORMAL LOW (ref 8.9–10.3)
Chloride: 103 mmol/L (ref 98–111)
Creatinine, Ser: 0.71 mg/dL (ref 0.61–1.24)
GFR, Estimated: >60 mL/min (ref >60)
Glucose, Bld: 90 mg/dL (ref 70–99)
Potassium: 3.3 mmol/L — ABNORMAL LOW (ref 3.5–5.1)
Sodium: 135 mmol/L (ref 135–145)
Total Bilirubin: 1.1 mg/dL (ref 0.0–1.2)
Total Protein: 6.3 g/dL — ABNORMAL LOW (ref 6.5–8.1)

## 2023-08-24 LAB — HEMOGLOBIN AND HEMATOCRIT, BLOOD
HCT: 39.1 % (ref 39.0–52.0)
HCT: 40.9 % (ref 39.0–52.0)
Hemoglobin: 13.7 g/dL (ref 13.0–17.0)
Hemoglobin: 14.3 g/dL (ref 13.0–17.0)

## 2023-08-24 LAB — LACTIC ACID, PLASMA
Lactic Acid, Venous: 0.8 mmol/L (ref 0.5–1.9)
Lactic Acid, Venous: 0.7 mmol/L (ref 0.5–1.9)

## 2023-08-24 LAB — SEDIMENTATION RATE: Sed Rate: 2 mm/h (ref 0–16)

## 2023-08-24 LAB — C-REACTIVE PROTEIN: CRP: 0.5 mg/dL (ref <1.0)

## 2023-08-24 LAB — PROTIME-INR
INR: 1.2 (ref 0.8–1.2)
Prothrombin Time: 15.6 s — ABNORMAL HIGH (ref 11.4–15.2)

## 2023-08-24 LAB — PROCALCITONIN: Procalcitonin: <0.1 ng/mL

## 2023-08-24 MED ORDER — OXYCODONE HCL 5 MG PO TABS
10.0000 mg | ORAL_TABLET | ORAL | Status: DC | PRN
  Administered 2023-08-24 – 2023-08-26 (×6): 10 mg via ORAL
  Filled 2023-08-24 (×6): qty 2

## 2023-08-24 MED ORDER — PROCHLORPERAZINE EDISYLATE 10 MG/2ML IJ SOLN
5.0000 mg | Freq: Four times a day (QID) | INTRAMUSCULAR | Status: DC | PRN
  Administered 2023-08-24 – 2023-08-26 (×4): 5 mg via INTRAVENOUS
  Filled 2023-08-24 (×4): qty 2

## 2023-08-24 MED ORDER — MORPHINE SULFATE (PF) 2 MG/ML IV SOLN
2.0000 mg | INTRAVENOUS | Status: DC | PRN
  Administered 2023-08-24 – 2023-08-25 (×10): 2 mg via INTRAVENOUS
  Filled 2023-08-24 (×10): qty 1

## 2023-08-24 MED ORDER — POTASSIUM CHLORIDE CRYS ER 20 MEQ PO TBCR
40.0000 meq | EXTENDED_RELEASE_TABLET | Freq: Once | ORAL | Status: AC
  Administered 2023-08-24: 40 meq via ORAL
  Filled 2023-08-24: qty 2

## 2023-08-24 MED ORDER — BOOST / RESOURCE BREEZE PO LIQD CUSTOM: 1.0000 | Freq: Two times a day (BID) | ORAL | Status: DC

## 2023-08-24 NOTE — Progress Notes (Signed)
 Progress Note   Patient: [name] FMW:[id number] DOB: May 30, 1990 DOA: 08/23/2023     1 DOS: the patient was seen and examined on 08/24/2023   Brief hospital course: From HPI [name] is a 33 y.o. male with medical history significant of  ADHD whooping cough, who presents to ED with intractable n/v. Of note patient has interim history of admission for the same 7/24-7/26 at the initial onset of his symptoms. At that time history notes patient symptoms began after eating shrimp alfredo pasta dish that was re-heated. Patient was admitted  with diagnosis of gastroenteritis due to food borne illness. AT that time he was also noted to have + FOBT but noted brown stools. He at that time endorsed increase nsaid use after recent ankle surgery.Patient treated supportively with ppi , anti-emetics and was discharged.  Patient s/p discharge had recurrent symptoms and returned to ED AM of 7/27. He was evaluated has repeat CT scan which noted no acute findings. Patient was treated supportively and discharged home. However this pm patient returns with continues symptoms despite oral medication given at home. He also notes episode of hematemesis.Patient has since has emesis in ED w/o note of hematemesis.    Assessment and Plan:  Intractable nausea vomiting likely secondary to viral gastroenteritis  Patient with multiple recent admissions GI panel obtained 3 days ago was normal Continue IV fluids Continue as needed Zofran , Reglan  - continue with ppi  Gastroenterologist on board and case discussed   Hematemesis - in setting of repeated vomiting  - no hematemesis noted in ED  Continue PPI Gastroenterologist on board and planning EGD tomorrow   Hypokaleima Continue repletion and monitoring   ADHD -resume home regimen as able    Obesity Class 1 Counseled on weight loss when medically stable  Mild ALT Elevation  -continue to trend    DVT prophylaxis: scd  Code Status: full/ as discussed  per patient wishes in event of cardiac arrest   Family Communication: Discussed with spouse present at bedside  Disposition Plan: Pending EGD tomorrow   Consults called: GI     Subjective:   Patient seen and examined at bedside this morning in the presence of the spouse He tells me he had a vomiting episode around 2:30 AM today He is doing much better Gastroenterology is consulted and planning to scope patient by tomorrow Patient placed on diet  Physical Exam: General: Patient laying in bed in no acute distress Respiratory: clear to auscultation bilaterally, no wheezing, no crackles. Normal respiratory effort. No accessory muscle use.  Cardiovascular: Regular rate and rhythm, no murmurs / rubs / gallops. No extremity edema. 2+ pedal pulses. No carotid bruits.  Abdomen: epigastric tenderness, no masses palpated. No hepatosplenomegaly. Bowel sounds positive.  Musculoskeletal: no clubbing / cyanosis. No joint deformity upper and lower extremities. Good ROM, no contractures. Normal muscle tone.  Skin: no rashes, lesions, ulcers. No induration Neurologic: CN grossly intact. Sensation intact, . Strength 5/5 in all 4.  Psychiatric: Normal judgment and insight. Alert and oriented x 3. Normal mood.   Data Reviewed: Abdominal pelvic CT scan reviewed that did not show any acute intra-abdominal pathology    Latest Ref Rng & Units 08/24/2023    1:20 PM 08/24/2023    4:47 AM 08/24/2023   12:41 AM  CBC  WBC 4.0 - 10.5 K/uL  10.3    Hemoglobin 13.0 - 17.0 g/dL 85.6  86.5  86.2   Hematocrit 39.0 - 52.0 % 40.9  38.9  39.1  Platelets 150 - 400 K/uL  235         Latest Ref Rng & Units 08/24/2023    4:47 AM 08/23/2023    6:38 PM 08/23/2023    9:37 AM  BMP  Glucose 70 - 99 mg/dL 90  92  898   BUN 6 - 20 mg/dL 10  9  10    Creatinine 0.61 - 1.24 mg/dL 9.28  9.26  9.23   Sodium 135 - 145 mmol/L 135  139  138   Potassium 3.5 - 5.1 mmol/L 3.3  3.1  3.3   Chloride 98 - 111 mmol/L 103  103  101    CO2 22 - 32 mmol/L 22  24  25    Calcium 8.9 - 10.3 mg/dL 8.5  9.2  9.2     Vitals:   08/24/23 0328 08/24/23 0810 08/24/23 1301 08/24/23 1615  BP: 132/75 114/78 (!) 140/83 (!) 143/80  Pulse: 75 (!) 59 (!) 52 (!) 59  Resp: 16  16 18   Temp: 99.1 F (37.3 C) 98.8 F (37.1 C) 98.2 F (36.8 C) 98.3 F (36.8 C)  TempSrc: Oral Oral Oral Oral  SpO2: 94% 95% 99% 98%  Weight:      Height:         Time spent: 52 minutes  Author: Drue ONEIDA Potter, MD 08/24/2023 4:17 PM  For on call review [email or web address].

## 2023-08-24 NOTE — TOC Initial Note (Signed)
 Transition of Care [facility]) - Initial/Assessment Note    Patient Details  Name: [name] MRN: [id number] Date of Birth: 06/18/1990  [facility] CM/SW Contact:    Bascom Service, RN Phone Number: 08/24/2023, 2:39 PM  Clinical Narrative:  d/c plan home.                 Expected Discharge Plan: Home/Self Care Barriers to Discharge: No Barriers Identified   Patient Goals and CMS Choice Patient states their goals for this hospitalization and ongoing recovery are:: Home CMS Medicare.gov Compare Post Acute Care list provided to:: Patient Choice offered to / list presented to : [name] ownership interest in [facility].provided to:: Patient    Expected Discharge Plan and Services                                              Prior Living Arrangements/Services                       Activities of Daily Living   ADL Screening (condition at time of admission) Independently performs ADLs?: Yes (appropriate for developmental age) Is the patient deaf or have difficulty hearing?: No Does the patient have difficulty seeing, even when wearing glasses/contacts?: No Does the patient have difficulty concentrating, remembering, or making decisions?: No  Permission Sought/Granted                  Emotional Assessment              Admission diagnosis:  Hypokalemia [E87.6] Intractable nausea and vomiting [R11.2] Hematemesis with nausea [K92.0] Patient Active Problem List   Diagnosis Date Noted   Intractable nausea and vomiting 08/23/2023   Infectious colitis, enteritis, and gastroenteritis 08/21/2023   Acute gastroenteritis 08/20/2023   Hyperproteinemia 08/20/2023   Hypercalcemia 08/20/2023   Hypokalemia 08/20/2023   Class 1 obesity 08/20/2023   Transaminitis 08/20/2023   Volume depletion 08/20/2023   PCP:  Sun, Vyvyan, MD Pharmacy:   [facility] [phone] [address] [place] - [address] AT [facility] [address] [place] [address] Phone: [phone] Fax: [phone]     Social Drivers of Health (SDOH) Social History: SDOH Screenings   Food Insecurity: No Food Insecurity (08/24/2023)  Housing: Low Risk  (08/24/2023)  Transportation Needs: No Transportation Needs (08/24/2023)  Utilities: Not At Risk (08/24/2023)  Recent Concern: Utilities - At Risk (08/20/2023)  Tobacco Use: High Risk (08/24/2023)   SDOH Interventions:     Readmission Risk Interventions     No data to display

## 2023-08-24 NOTE — H&P (View-Only) (Signed)
 [facility] Gastroenterology Consult  Referring Provider: No ref. provider found Primary Care Physician:  Sun, Vyvyan, MD Primary Gastroenterologist: Sampson   Reason for Consultation: Hematemesis, melena  SUBJECTIVE:   HPI: [name] is a 33 y.o. male with past medical history significant for plantar fascitis status post surgical intervention in June 2025, subsequently taking Ibuprofen  800 mg PO BID. Began to have intractable nausea/vomiting roughly 1 week ago attributed to viral gastroenteritis after eating seafood alfredo. He had continued emesis with blood which prompted his presentation to hospital. He noted having some melena intermittently over the past 1 month also. Some LUQ abdominal pain. Stools are both loose and formed in consistency. No chest pain or shortness of breath. No prior EGD or colonoscopy. No known family history colon cancer. Not on PPI therapy at home.   Past Medical History:  Diagnosis Date   ADHD    Whooping cough    Past Surgical History:  Procedure Laterality Date   FOOT SURGERY  06/2023   Prior to Admission medications   Medication Sig Start Date End Date Taking? Authorizing Provider  amphetamine-dextroamphetamine (ADDERALL) 30 MG tablet Take 1 tablet by mouth 2 (two) times daily. 07/20/23  Yes [provider]  ondansetron  (ZOFRAN ) 4 MG tablet Take 1 tablet (4 mg total) by mouth daily as needed for nausea or vomiting. 08/22/23 08/21/24 Yes Lue Elsie BROCKS, MD  pantoprazole  (PROTONIX ) 40 MG tablet Take 1 tablet (40 mg total) by mouth 2 (two) times daily. 08/22/23 09/21/23 Yes Lue Elsie BROCKS, MD  promethazine  (PHENERGAN ) 25 MG tablet Take 1 tablet (25 mg total) by mouth every 6 (six) hours as needed for nausea or vomiting. 08/22/23  Yes Lue Elsie BROCKS, MD   Current Facility-Administered Medications  Medication Dose Route Frequency Provider Last Rate Last Admin   0.9 %  sodium chloride  infusion   Intravenous Continuous Debby Hitch  A, MD 100 mL/hr at 08/24/23 1514 New Bag at 08/24/23 1514   albuterol  (PROVENTIL ) (2.5 MG/3ML) 0.083% nebulizer solution 2.5 mg  2.5 mg Nebulization Q2H PRN Thomas, Sara-Maiz A, MD       NOREEN ON 08/25/2023] feeding supplement (BOOST / RESOURCE BREEZE) liquid 1 Container  1 Container Oral BID BM Djan, Prince T, MD       metoCLOPramide  (REGLAN ) 10 MG/10ML solution 10 mg  10 mg Oral TID AC Thomas, Sara-Maiz A, MD   10 mg at 08/24/23 1153   morphine  (PF) 2 MG/ML injection 2 mg  2 mg Intravenous Q2H PRN Dorinda Homans T, MD   2 mg at 08/24/23 1515   ondansetron  (ZOFRAN ) tablet 4 mg  4 mg Oral Q6H PRN Debby Hitch LABOR, MD       Or   ondansetron  (ZOFRAN ) injection 4 mg  4 mg Intravenous Q6H PRN Debby Hitch A, MD   4 mg at 08/23/23 2326   oxyCODONE  (Oxy IR/ROXICODONE ) immediate release tablet 10 mg  10 mg Oral Q4H PRN Dorinda Homans T, MD   10 mg at 08/24/23 1341   pantoprazole  (PROTONIX ) injection 40 mg  40 mg Intravenous Q12H Debby Hitch A, MD   40 mg at 08/24/23 9050   prochlorperazine  (COMPAZINE ) injection 5 mg  5 mg Intravenous Q6H PRN Kyere, Belinda K, NP   5 mg at 08/24/23 0324   Allergies as of 08/23/2023   (No Known Allergies)   Family History  Problem Relation Age of Onset   Hypertension Mother    Social History   Socioeconomic History   Marital status: Married  Spouse name: Not on file   Number of children: Not on file   Years of education: Not on file   Highest education level: Not on file  Occupational History   Not on file  Tobacco Use   Smoking status: Every Day    Types: E-cigarettes   Smokeless tobacco: Never  Vaping Use   Vaping status: Every Day  Substance and Sexual Activity   Alcohol use: Yes    Comment: rare   Drug use: No   Sexual activity: Not on file  Other Topics Concern   Not on file  Social History Narrative   Not on file   Social Drivers of Health   Financial Resource Strain: Not on file  Food Insecurity: No Food Insecurity  (08/24/2023)   Hunger Vital Sign    Worried About Running Out of Food in the Last Year: Never true    Ran Out of Food in the Last Year: Never true  Transportation Needs: No Transportation Needs (08/24/2023)   PRAPARE - Administrator, Civil Service (Medical): No    Lack of Transportation (Non-Medical): No  Physical Activity: Not on file  Stress: Not on file  Social Connections: Not on file  Intimate Partner Violence: Not At Risk (08/24/2023)   Humiliation, Afraid, Rape, and Kick questionnaire    Fear of Current or Ex-Partner: No    Emotionally Abused: No    Physically Abused: No    Sexually Abused: No   Review of Systems:  Review of Systems  Respiratory:  Negative for shortness of breath.   Cardiovascular:  Negative for chest pain.  Gastrointestinal:  Positive for abdominal pain, melena, nausea and vomiting.    OBJECTIVE:   Temp:  [98.2 F (36.8 C)-99.1 F (37.3 C)] 98.2 F (36.8 C) (07/28 1301) Pulse Rate:  [52-84] 52 (07/28 1301) Resp:  [16-20] 16 (07/28 1301) BP: (114-160)/(75-96) 140/83 (07/28 1301) SpO2:  [94 %-100 %] 99 % (07/28 1301) Weight:  [108.9 kg] 108.9 kg (07/27 1719)   Physical Exam Constitutional:      General: He is not in acute distress.    Appearance: He is not ill-appearing, toxic-appearing or diaphoretic.  Cardiovascular:     Rate and Rhythm: Normal rate and regular rhythm.  Pulmonary:     Effort: No respiratory distress.     Breath sounds: Normal breath sounds.  Abdominal:     General: Bowel sounds are normal. There is no distension.     Palpations: Abdomen is soft.     Tenderness: There is abdominal tenderness. There is no guarding.  Musculoskeletal:     Right lower leg: No edema.     Left lower leg: No edema.  Skin:    General: Skin is warm and dry.  Neurological:     Mental Status: He is alert.     Labs: Recent Labs    08/23/23 0937 08/23/23 1838 08/24/23 0041 08/24/23 0447 08/24/23 1320  WBC 9.1 9.2  --  10.3  --    HGB 14.6 14.4 13.7 13.4 14.3  HCT 42.6 41.3 39.1 38.9* 40.9  PLT 244 261  --  235  --    BMET Recent Labs    08/23/23 0937 08/23/23 1838 08/24/23 0447  NA 138 139 135  K 3.3* 3.1* 3.3*  CL 101 103 103  CO2 25 24 22   GLUCOSE 101* 92 90  BUN 10 9 10   CREATININE 0.76 0.73 0.71  CALCIUM 9.2 9.2 8.5*   LFT Recent Labs  08/24/23 0447  PROT 6.3*  ALBUMIN 3.7  AST 17  ALT 45*  ALKPHOS 46  BILITOT 1.1   PT/INR Recent Labs    08/24/23 0447  LABPROT 15.6*  INR 1.2    Diagnostic imaging: CT ABDOMEN PELVIS WO CONTRAST Result Date: 08/23/2023 CLINICAL DATA:  Nausea with nonlocalized abdominal pain. EXAM: CT ABDOMEN AND PELVIS WITHOUT CONTRAST TECHNIQUE: Multidetector CT imaging of the abdomen and pelvis was performed following the standard protocol without IV contrast. RADIATION DOSE REDUCTION: This exam was performed according to the departmental dose-optimization program which includes automated exposure control, adjustment of the mA and/or kV according to patient size and/or use of iterative reconstruction technique. COMPARISON:  08/20/2023 FINDINGS: Lower chest: 2 mm right lower lobe nodule on 44/6 is stable in the interval since the most recent comparison study and also comparing back to an exam from 10/25/2017, consistent with benign etiology. No followup imaging is recommended. Hepatobiliary: No suspicious focal abnormality in the liver on this study without intravenous contrast. Layering high attenuation material in the lumen of the gallbladder may reflect sludge or some vicarious excretion of contrast from previous recent CT imaging. No intrahepatic or extrahepatic biliary dilation. Pancreas: No focal mass lesion. No dilatation of the main duct. No intraparenchymal cyst. No peripancreatic edema. Spleen: No splenomegaly. No suspicious focal mass lesion. Adrenals/Urinary Tract: No adrenal nodule or mass. Kidneys unremarkable. No evidence for hydroureter. The urinary bladder  appears normal for the degree of distention. Stomach/Bowel: Stomach is unremarkable. No gastric wall thickening. No evidence of outlet obstruction. Duodenum is normally positioned as is the ligament of Treitz. No small bowel wall thickening. No small bowel dilatation. The terminal ileum is normal. The appendix is normal. No gross colonic mass. No colonic wall thickening. Vascular/Lymphatic: No abdominal aortic aneurysm. No abdominal aortic atherosclerotic calcification. There is no gastrohepatic or hepatoduodenal ligament lymphadenopathy. No retroperitoneal or mesenteric lymphadenopathy. No pelvic sidewall lymphadenopathy. Reproductive: The prostate gland and seminal vesicles are unremarkable. Other: No intraperitoneal free fluid. Musculoskeletal: No worrisome lytic or sclerotic osseous abnormality. IMPRESSION: 1. No acute findings in the abdomen or pelvis. Specifically, no findings to explain the patient's history of nausea and abdominal pain. 2. Layering high attenuation material in the lumen of the gallbladder may reflect sludge or vicarious excretion of contrast from previous recent CT imaging. Electronically Signed   By: Camellia Candle M.D.   On: 08/23/2023 10:52   CT HEAD WO CONTRAST Result Date: 08/23/2023 EXAM: CT HEAD WITHOUT CONTRAST 08/23/2023 10:21:16 AM TECHNIQUE: CT of the head was performed without the administration of intravenous contrast. Automated exposure control, iterative reconstruction, and/or weight based adjustment of the mA/kV was utilized to reduce the radiation dose to as low as reasonably achievable. COMPARISON: CT head without contrast 04/20/2014. CLINICAL HISTORY: Headache, increasing frequency or severity. Pt presents to the ED for severe headache and nausea that has been present since yesterday when he was discharged from hospital. Pt was recently admitted for gastritis. Pt states that he was told to report to ED if his symptoms do improve. Pt has not been able sleep or eat because  of the nausea and pain. FINDINGS: BRAIN AND VENTRICLES: No acute hemorrhage. Gray-white differentiation is preserved. No hydrocephalus. No extra-axial collection. No mass effect or midline shift. ORBITS: No acute abnormality. SINUSES: No acute abnormality. SOFT TISSUES AND SKULL: No acute soft tissue abnormality. No skull fracture. IMPRESSION: 1. No acute intracranial abnormality. Electronically signed by: Lonni Necessary MD 08/23/2023 10:42 AM EDT RP Workstation: [id number]  IMPRESSION: Hematemesis Melena NSAID use Epigastric/left upper quadrant pain  PLAN: -Recommend EGD to further evaluate hematemesis, melena -Discussed this with patient and his spouse at bedside including benefits, alternatives and risks of bleeding/infection/perforation/missed lesion/anesthesia, he verbalized understanding and elected to proceed -IV PPI Q12Hr -Ok for diet today, NPO at midnight for EGD 08/25/23   LOS: 1 day   Estefana Keas, DO [facility] Gastroenterology

## 2023-08-24 NOTE — Progress Notes (Signed)
 Initial Nutrition Assessment  DOCUMENTATION CODES:   Not applicable  INTERVENTION:  -Continue CL diet per MD -Add Boost Breeze BID -Discussed importance of adequate kcal/pro intake -Discussed strategies for adequate PO intake at baseline when symptoms arise   NUTRITION DIAGNOSIS:   Inadequate oral intake related to nausea, vomiting, altered GI function, acute illness as evidenced by per patient/family report, meal completion < 50%.   GOAL:   Patient will meet greater than or equal to 90% of their needs   MONITOR:   PO intake, Weight trends, Supplement acceptance, Labs, Skin, Diet advancement  REASON FOR ASSESSMENT:   Consult Poor PO  ASSESSMENT:   Hx ADHD, whooping cough. Multiple ED visits for intractable nausea/vomiting.  Spoke to pt at wife in room. Pt continues with n/v/d. Dx with gastroenteritis d/t [employer] at last admission, has been back and forth to ED since, had hematemesis prior to most recent ED visit. Antiemetics have not resolved symptoms, only helpful medication has been Reglan . Pt also has bouts of nausea/vomiting at baseline. Pt also states diarrhea at baseline, sometimes blood present. Pt/wife cannot identify any specific food triggers associated with these. Also experiences bloating, belching. Last BM 7/25. Discussed strategies for increasing PO intake with n/v. Discussed GI symptoms at length; suspect pt should f/u with Gastroenterology outpatient for further work up of chronic symptoms. Pt unaware of any family hx of GI issues or autoimmune disorders, though, is adopted and doesn't know father's side. Pt does have noted THC in tox screen, discussed with pt. Pt states only very occasional use. Pt does endorse some weight loss from poor PO intake x 7 days, muscle loss noted. Encouraged pt to increase PO intake as tolerated. Add Boost Breeze BID. Pt/wife deny additional questions/concerns at this time, will continue to monitor, RDN available prn.    Labs Potassium Calcium 8.5 Mg 2.5 ALT 45 CRP 0.5  Medications  metoCLOPramide   10 mg Oral TID AC   pantoprazole  (PROTONIX ) IV  40 mg Intravenous Q12H     NUTRITION - FOCUSED PHYSICAL EXAM:  Flowsheet Row Most Recent Value  Orbital Region No depletion  Upper Arm Region No depletion  Thoracic and Lumbar Region Mild depletion  Buccal Region No depletion  Temple Region No depletion  Clavicle Bone Region Mild depletion  Clavicle and Acromion Bone Region Mild depletion  Scapular Bone Region Mild depletion  Dorsal Hand No depletion  Patellar Region Mild depletion  Anterior Thigh Region Mild depletion  Posterior Calf Region Mild depletion  Edema (RD Assessment) None  Hair Reviewed  Eyes Reviewed  Mouth Reviewed  Skin Reviewed  Nails Reviewed    Diet Order:   Diet Order             Diet NPO time specified  Diet effective midnight           Diet clear liquid Room service appropriate? Yes; Fluid consistency: Thin  Diet effective now                   EDUCATION NEEDS:   Education needs have been addressed  Skin:  Skin Assessment: Reviewed RN Assessment  Last BM:  7/28  Height:   Ht Readings from Last 1 Encounters:  08/23/23 6' 3 (1.905 m)    Weight:   Wt Readings from Last 1 Encounters:  08/23/23 108.9 kg    BMI:  Body mass index is 30 kg/m.  Estimated Nutritional Needs:   Kcal:  7649-7174 kcal  Protein:  105-135 g  Fluid:  >/=  2 L    Danyela Posas Daml-Budig, RDN, LDN Registered Dietitian Nutritionist RD Inpatient Contact Info in [place]

## 2023-08-24 NOTE — Consult Note (Signed)
 [facility] Gastroenterology Consult  Referring Provider: No ref. provider found Primary Care Physician:  Sun, Vyvyan, MD Primary Gastroenterologist: Sampson   Reason for Consultation: Hematemesis, melena  SUBJECTIVE:   HPI: [name] is a 33 y.o. male with past medical history significant for plantar fascitis status post surgical intervention in June 2025, subsequently taking Ibuprofen  800 mg PO BID. Began to have intractable nausea/vomiting roughly 1 week ago attributed to viral gastroenteritis after eating seafood alfredo. He had continued emesis with blood which prompted his presentation to hospital. He noted having some melena intermittently over the past 1 month also. Some LUQ abdominal pain. Stools are both loose and formed in consistency. No chest pain or shortness of breath. No prior EGD or colonoscopy. No known family history colon cancer. Not on PPI therapy at home.   Past Medical History:  Diagnosis Date   ADHD    Whooping cough    Past Surgical History:  Procedure Laterality Date   FOOT SURGERY  06/2023   Prior to Admission medications   Medication Sig Start Date End Date Taking? Authorizing Provider  amphetamine-dextroamphetamine (ADDERALL) 30 MG tablet Take 1 tablet by mouth 2 (two) times daily. 07/20/23  Yes [provider]  ondansetron  (ZOFRAN ) 4 MG tablet Take 1 tablet (4 mg total) by mouth daily as needed for nausea or vomiting. 08/22/23 08/21/24 Yes Lue Elsie BROCKS, MD  pantoprazole  (PROTONIX ) 40 MG tablet Take 1 tablet (40 mg total) by mouth 2 (two) times daily. 08/22/23 09/21/23 Yes Lue Elsie BROCKS, MD  promethazine  (PHENERGAN ) 25 MG tablet Take 1 tablet (25 mg total) by mouth every 6 (six) hours as needed for nausea or vomiting. 08/22/23  Yes Lue Elsie BROCKS, MD   Current Facility-Administered Medications  Medication Dose Route Frequency Provider Last Rate Last Admin   0.9 %  sodium chloride  infusion   Intravenous Continuous Debby Hitch  A, MD 100 mL/hr at 08/24/23 1514 New Bag at 08/24/23 1514   albuterol  (PROVENTIL ) (2.5 MG/3ML) 0.083% nebulizer solution 2.5 mg  2.5 mg Nebulization Q2H PRN Thomas, Sara-Maiz A, MD       NOREEN ON 08/25/2023] feeding supplement (BOOST / RESOURCE BREEZE) liquid 1 Container  1 Container Oral BID BM Djan, Prince T, MD       metoCLOPramide  (REGLAN ) 10 MG/10ML solution 10 mg  10 mg Oral TID AC Thomas, Sara-Maiz A, MD   10 mg at 08/24/23 1153   morphine  (PF) 2 MG/ML injection 2 mg  2 mg Intravenous Q2H PRN Dorinda Homans T, MD   2 mg at 08/24/23 1515   ondansetron  (ZOFRAN ) tablet 4 mg  4 mg Oral Q6H PRN Debby Hitch LABOR, MD       Or   ondansetron  (ZOFRAN ) injection 4 mg  4 mg Intravenous Q6H PRN Debby Hitch A, MD   4 mg at 08/23/23 2326   oxyCODONE  (Oxy IR/ROXICODONE ) immediate release tablet 10 mg  10 mg Oral Q4H PRN Dorinda Homans T, MD   10 mg at 08/24/23 1341   pantoprazole  (PROTONIX ) injection 40 mg  40 mg Intravenous Q12H Debby Hitch A, MD   40 mg at 08/24/23 9050   prochlorperazine  (COMPAZINE ) injection 5 mg  5 mg Intravenous Q6H PRN Kyere, Belinda K, NP   5 mg at 08/24/23 0324   Allergies as of 08/23/2023   (No Known Allergies)   Family History  Problem Relation Age of Onset   Hypertension Mother    Social History   Socioeconomic History   Marital status: Married  Spouse name: Not on file   Number of children: Not on file   Years of education: Not on file   Highest education level: Not on file  Occupational History   Not on file  Tobacco Use   Smoking status: Every Day    Types: E-cigarettes   Smokeless tobacco: Never  Vaping Use   Vaping status: Every Day  Substance and Sexual Activity   Alcohol use: Yes    Comment: rare   Drug use: No   Sexual activity: Not on file  Other Topics Concern   Not on file  Social History Narrative   Not on file   Social Drivers of Health   Financial Resource Strain: Not on file  Food Insecurity: No Food Insecurity  (08/24/2023)   Hunger Vital Sign    Worried About Running Out of Food in the Last Year: Never true    Ran Out of Food in the Last Year: Never true  Transportation Needs: No Transportation Needs (08/24/2023)   PRAPARE - Administrator, Civil Service (Medical): No    Lack of Transportation (Non-Medical): No  Physical Activity: Not on file  Stress: Not on file  Social Connections: Not on file  Intimate Partner Violence: Not At Risk (08/24/2023)   Humiliation, Afraid, Rape, and Kick questionnaire    Fear of Current or Ex-Partner: No    Emotionally Abused: No    Physically Abused: No    Sexually Abused: No   Review of Systems:  Review of Systems  Respiratory:  Negative for shortness of breath.   Cardiovascular:  Negative for chest pain.  Gastrointestinal:  Positive for abdominal pain, melena, nausea and vomiting.    OBJECTIVE:   Temp:  [98.2 F (36.8 C)-99.1 F (37.3 C)] 98.2 F (36.8 C) (07/28 1301) Pulse Rate:  [52-84] 52 (07/28 1301) Resp:  [16-20] 16 (07/28 1301) BP: (114-160)/(75-96) 140/83 (07/28 1301) SpO2:  [94 %-100 %] 99 % (07/28 1301) Weight:  [108.9 kg] 108.9 kg (07/27 1719)   Physical Exam Constitutional:      General: He is not in acute distress.    Appearance: He is not ill-appearing, toxic-appearing or diaphoretic.  Cardiovascular:     Rate and Rhythm: Normal rate and regular rhythm.  Pulmonary:     Effort: No respiratory distress.     Breath sounds: Normal breath sounds.  Abdominal:     General: Bowel sounds are normal. There is no distension.     Palpations: Abdomen is soft.     Tenderness: There is abdominal tenderness. There is no guarding.  Musculoskeletal:     Right lower leg: No edema.     Left lower leg: No edema.  Skin:    General: Skin is warm and dry.  Neurological:     Mental Status: He is alert.     Labs: Recent Labs    08/23/23 0937 08/23/23 1838 08/24/23 0041 08/24/23 0447 08/24/23 1320  WBC 9.1 9.2  --  10.3  --    HGB 14.6 14.4 13.7 13.4 14.3  HCT 42.6 41.3 39.1 38.9* 40.9  PLT 244 261  --  235  --    BMET Recent Labs    08/23/23 0937 08/23/23 1838 08/24/23 0447  NA 138 139 135  K 3.3* 3.1* 3.3*  CL 101 103 103  CO2 25 24 22   GLUCOSE 101* 92 90  BUN 10 9 10   CREATININE 0.76 0.73 0.71  CALCIUM 9.2 9.2 8.5*   LFT Recent Labs  08/24/23 0447  PROT 6.3*  ALBUMIN 3.7  AST 17  ALT 45*  ALKPHOS 46  BILITOT 1.1   PT/INR Recent Labs    08/24/23 0447  LABPROT 15.6*  INR 1.2    Diagnostic imaging: CT ABDOMEN PELVIS WO CONTRAST Result Date: 08/23/2023 CLINICAL DATA:  Nausea with nonlocalized abdominal pain. EXAM: CT ABDOMEN AND PELVIS WITHOUT CONTRAST TECHNIQUE: Multidetector CT imaging of the abdomen and pelvis was performed following the standard protocol without IV contrast. RADIATION DOSE REDUCTION: This exam was performed according to the departmental dose-optimization program which includes automated exposure control, adjustment of the mA and/or kV according to patient size and/or use of iterative reconstruction technique. COMPARISON:  08/20/2023 FINDINGS: Lower chest: 2 mm right lower lobe nodule on 44/6 is stable in the interval since the most recent comparison study and also comparing back to an exam from 10/25/2017, consistent with benign etiology. No followup imaging is recommended. Hepatobiliary: No suspicious focal abnormality in the liver on this study without intravenous contrast. Layering high attenuation material in the lumen of the gallbladder may reflect sludge or some vicarious excretion of contrast from previous recent CT imaging. No intrahepatic or extrahepatic biliary dilation. Pancreas: No focal mass lesion. No dilatation of the main duct. No intraparenchymal cyst. No peripancreatic edema. Spleen: No splenomegaly. No suspicious focal mass lesion. Adrenals/Urinary Tract: No adrenal nodule or mass. Kidneys unremarkable. No evidence for hydroureter. The urinary bladder  appears normal for the degree of distention. Stomach/Bowel: Stomach is unremarkable. No gastric wall thickening. No evidence of outlet obstruction. Duodenum is normally positioned as is the ligament of Treitz. No small bowel wall thickening. No small bowel dilatation. The terminal ileum is normal. The appendix is normal. No gross colonic mass. No colonic wall thickening. Vascular/Lymphatic: No abdominal aortic aneurysm. No abdominal aortic atherosclerotic calcification. There is no gastrohepatic or hepatoduodenal ligament lymphadenopathy. No retroperitoneal or mesenteric lymphadenopathy. No pelvic sidewall lymphadenopathy. Reproductive: The prostate gland and seminal vesicles are unremarkable. Other: No intraperitoneal free fluid. Musculoskeletal: No worrisome lytic or sclerotic osseous abnormality. IMPRESSION: 1. No acute findings in the abdomen or pelvis. Specifically, no findings to explain the patient's history of nausea and abdominal pain. 2. Layering high attenuation material in the lumen of the gallbladder may reflect sludge or vicarious excretion of contrast from previous recent CT imaging. Electronically Signed   By: Camellia Candle M.D.   On: 08/23/2023 10:52   CT HEAD WO CONTRAST Result Date: 08/23/2023 EXAM: CT HEAD WITHOUT CONTRAST 08/23/2023 10:21:16 AM TECHNIQUE: CT of the head was performed without the administration of intravenous contrast. Automated exposure control, iterative reconstruction, and/or weight based adjustment of the mA/kV was utilized to reduce the radiation dose to as low as reasonably achievable. COMPARISON: CT head without contrast 04/20/2014. CLINICAL HISTORY: Headache, increasing frequency or severity. Pt presents to the ED for severe headache and nausea that has been present since yesterday when he was discharged from hospital. Pt was recently admitted for gastritis. Pt states that he was told to report to ED if his symptoms do improve. Pt has not been able sleep or eat because  of the nausea and pain. FINDINGS: BRAIN AND VENTRICLES: No acute hemorrhage. Gray-white differentiation is preserved. No hydrocephalus. No extra-axial collection. No mass effect or midline shift. ORBITS: No acute abnormality. SINUSES: No acute abnormality. SOFT TISSUES AND SKULL: No acute soft tissue abnormality. No skull fracture. IMPRESSION: 1. No acute intracranial abnormality. Electronically signed by: Lonni Necessary MD 08/23/2023 10:42 AM EDT RP Workstation: [id number]  IMPRESSION: Hematemesis Melena NSAID use Epigastric/left upper quadrant pain  PLAN: -Recommend EGD to further evaluate hematemesis, melena -Discussed this with patient and his spouse at bedside including benefits, alternatives and risks of bleeding/infection/perforation/missed lesion/anesthesia, he verbalized understanding and elected to proceed -IV PPI Q12Hr -Ok for diet today, NPO at midnight for EGD 08/25/23   LOS: 1 day   Estefana Keas, DO [facility] Gastroenterology

## 2023-08-24 NOTE — Plan of Care (Signed)

## 2023-08-25 ENCOUNTER — Encounter (HOSPITAL_COMMUNITY)
Admission: EM | Disposition: A | Discharge status: Home / Self Care | Payer: Self-pay | Source: Home / Self Care | Attending: Internal Medicine

## 2023-08-25 ENCOUNTER — Inpatient Hospital Stay (HOSPITAL_COMMUNITY): Admitting: Anesthesiology

## 2023-08-25 DIAGNOSIS — R112 Nausea with vomiting, unspecified: Secondary | ICD-10-CM | POA: Diagnosis not present

## 2023-08-25 DIAGNOSIS — K269 Duodenal ulcer, unspecified as acute or chronic, without hemorrhage or perforation: Secondary | ICD-10-CM

## 2023-08-25 DIAGNOSIS — E66813 Obesity, class 3: Secondary | ICD-10-CM | POA: Diagnosis not present

## 2023-08-25 DIAGNOSIS — Z6829 Body mass index (BMI) 29.0-29.9, adult: Secondary | ICD-10-CM

## 2023-08-25 HISTORY — PX: ESOPHAGOGASTRODUODENOSCOPY: SHX5428

## 2023-08-25 LAB — CBC WITH DIFFERENTIAL/PLATELET
Abs Immature Granulocytes: 0.05 K/uL (ref 0.00–0.07)
Basophils Absolute: 0 K/uL (ref 0.0–0.1)
Basophils Relative: 0 %
Eosinophils Absolute: 0.1 K/uL (ref 0.0–0.5)
Eosinophils Relative: 1 %
HCT: 44.4 % (ref 39.0–52.0)
Hemoglobin: 14.7 g/dL (ref 13.0–17.0)
Immature Granulocytes: 1 %
Lymphocytes Relative: 18 %
Lymphs Abs: 1.8 K/uL (ref 0.7–4.0)
MCH: 31.1 pg (ref 26.0–34.0)
MCHC: 33.1 g/dL (ref 30.0–36.0)
MCV: 93.9 fL (ref 80.0–100.0)
Monocytes Absolute: 0.5 K/uL (ref 0.1–1.0)
Monocytes Relative: 5 %
Neutro Abs: 7.4 K/uL (ref 1.7–7.7)
Neutrophils Relative %: 75 %
Platelets: 238 K/uL (ref 150–400)
RBC: 4.73 MIL/uL (ref 4.22–5.81)
RDW: 12.3 % (ref 11.5–15.5)
WBC: 9.9 K/uL (ref 4.0–10.5)
nRBC: 0 % (ref 0.0–0.2)

## 2023-08-25 LAB — BASIC METABOLIC PANEL WITH GFR
Anion gap: 8 (ref 5–15)
BUN: 6 mg/dL (ref 6–20)
CO2: 23 mmol/L (ref 22–32)
Calcium: 8.8 mg/dL — ABNORMAL LOW (ref 8.9–10.3)
Chloride: 105 mmol/L (ref 98–111)
Creatinine, Ser: 0.68 mg/dL (ref 0.61–1.24)
GFR, Estimated: >60 mL/min (ref >60)
Glucose, Bld: 94 mg/dL (ref 70–99)
Potassium: 3.7 mmol/L (ref 3.5–5.1)
Sodium: 136 mmol/L (ref 135–145)

## 2023-08-25 SURGERY — EGD (ESOPHAGOGASTRODUODENOSCOPY)
Anesthesia: Monitor Anesthesia Care

## 2023-08-25 MED ORDER — SODIUM CHLORIDE 0.9 % IV SOLN: INTRAVENOUS | Status: DC

## 2023-08-25 MED ORDER — SODIUM CHLORIDE 0.9 % IV SOLN
25.0000 mg | Freq: Once | INTRAVENOUS | Status: AC
  Administered 2023-08-25: 25 mg via INTRAVENOUS
  Filled 2023-08-25: qty 1

## 2023-08-25 MED ORDER — ALBUTEROL SULFATE HFA 108 (90 BASE) MCG/ACT IN AERS
INHALATION_SPRAY | RESPIRATORY_TRACT | Status: AC
  Filled 2023-08-25: qty 6.7

## 2023-08-25 MED ORDER — LIDOCAINE 2% (20 MG/ML) 5 ML SYRINGE
INTRAMUSCULAR | Status: DC | PRN
Start: 2023-08-25 — End: 2023-08-25
  Administered 2023-08-25: 100 mg via INTRAVENOUS

## 2023-08-25 MED ORDER — FENTANYL CITRATE (PF) 100 MCG/2ML IJ SOLN
INTRAMUSCULAR | Status: AC
  Filled 2023-08-25: qty 2

## 2023-08-25 MED ORDER — SODIUM CHLORIDE 0.9 % IV SOLN
INTRAVENOUS | Status: DC | PRN
Start: 2023-08-25 — End: 2023-08-25

## 2023-08-25 MED ORDER — PROPOFOL 10 MG/ML IV BOLUS
INTRAVENOUS | Status: DC | PRN
Start: 2023-08-25 — End: 2023-08-25
  Administered 2023-08-25: 100 mg via INTRAVENOUS
  Administered 2023-08-25: 160 ug/kg/min via INTRAVENOUS
  Administered 2023-08-25: 100 mg via INTRAVENOUS

## 2023-08-25 MED ORDER — PROPOFOL 1000 MG/100ML IV EMUL
INTRAVENOUS | Status: AC
  Filled 2023-08-25: qty 100

## 2023-08-25 MED ORDER — SUCRALFATE 1 G PO TABS
1.0000 g | ORAL_TABLET | Freq: Three times a day (TID) | ORAL | Status: DC
  Administered 2023-08-25 – 2023-08-26 (×4): 1 g via ORAL
  Filled 2023-08-25 (×5): qty 1

## 2023-08-25 MED ORDER — FENTANYL CITRATE (PF) 100 MCG/2ML IJ SOLN
INTRAMUSCULAR | Status: DC | PRN
  Administered 2023-08-25 (×2): 50 ug via INTRAVENOUS

## 2023-08-25 NOTE — Progress Notes (Addendum)
 Report received. Assessment consistent with outgoing nurse's findings.   Jimeka Balan L, RN

## 2023-08-25 NOTE — Anesthesia Procedure Notes (Signed)
 Procedure Name: MAC Date/Time: 08/25/2023 1:30 PM  Performed by: Obadiah Reyes BROCKS, CRNAPre-anesthesia Checklist: Patient identified, Emergency Drugs available, Suction available, Patient being monitored and Timeout performed Patient Re-evaluated:Patient Re-evaluated prior to induction Oxygen Delivery Method: Simple face mask Preoxygenation: Pre-oxygenation with 100% oxygen Induction Type: IV induction

## 2023-08-25 NOTE — Transfer of Care (Signed)
 Immediate Anesthesia Transfer of Care Note  Patient: [name]  Procedure(s) Performed: EGD (ESOPHAGOGASTRODUODENOSCOPY)  Patient Location: PACU and Endoscopy Unit  Anesthesia Type:MAC  Level of Consciousness: sedated and responds to stimulation  Airway & Oxygen Therapy: Patient Spontanous Breathing and Patient connected to face mask oxygen  Post-op Assessment: Report given to RN and Post -op Vital signs reviewed and stable  Post vital signs: Reviewed and stable  Last Vitals:  Vitals Value Taken Time  BP    Temp    Pulse 58 08/25/23 13:46  Resp 25 08/25/23 13:46  SpO2 99 % 08/25/23 13:46  Vitals shown include unfiled device data.  Last Pain:  Vitals:   08/25/23 1226  TempSrc: Temporal  PainSc: 0-No pain      Patients Stated Pain Goal: 3 (08/24/23 1238)  Complications: No notable events documented.

## 2023-08-25 NOTE — Progress Notes (Addendum)
 Progress Note   Patient: [name] FMW:[id number] DOB: 1990-01-30 DOA: 08/23/2023     2 DOS: the patient was seen and examined on 08/25/2023    Brief hospital course: From HPI [name] is a 33 y.o. male with medical history significant of  ADHD whooping cough, who presents to ED with intractable n/v. Of note patient has interim history of admission for the same 7/24-7/26 at the initial onset of his symptoms. At that time history notes patient symptoms began after eating shrimp alfredo pasta dish that was re-heated. Patient was admitted  with diagnosis of gastroenteritis due to food borne illness. AT that time he was also noted to have + FOBT but noted brown stools. He at that time endorsed increase nsaid use after recent ankle surgery.Patient treated supportively with ppi , anti-emetics and was discharged.  Patient s/p discharge had recurrent symptoms and returned to ED AM of 7/27. He was evaluated has repeat CT scan which noted no acute findings. Patient was treated supportively and discharged home. However this pm patient returns with continues symptoms despite oral medication given at home. He also notes episode of hematemesis.Patient has since has emesis in ED w/o note of hematemesis.     Assessment and Plan:  Intractable nausea vomiting likely secondary to viral gastroenteritis  Patient with multiple recent admissions GI panel obtained 3 days ago was normal Continue IV fluids Continue as needed Zofran , Reglan  - continue with ppi  I have discussed with gastroenterologist on board today EGD done today showed duodenal ulcer, clean based, likely related to NSAID use and biopsies taken GI is okay for advancing diet today as tolerated, PPI twice per day for 2 months, sucralfate  TID for next 7 days.  Needs outpatient follow up with [name] in 6-8 weeks.    Upper GI bleeding secondary to duodenal ulcer - in setting of repeated vomiting  - no hematemesis noted in ED  Continue  PPI EGD findings as noted above   Hypokaleima-improved Continue repletion and monitoring   ADHD -resume home regimen as able    Obesity Class 1 Counseled on weight loss when medically stable   Mild ALT Elevation  -continue to trend    DVT prophylaxis: scd   Code Status: full/ as discussed per patient wishes in event of cardiac arrest   Family Communication: Discussed with spouse    Disposition Plan: Hopefully home tomorrow when medically stable   Consults called: GI      Subjective:  Patient seen and examined at bedside this morning He did have some nausea and vomiting this morning Underwent EGD by GI today that found duodenal ulcer but no active bleeding.   Physical Exam: General: Patient laying in bed in no acute distress Respiratory: clear to auscultation bilaterally, no wheezing, no crackles. Normal respiratory effort. No accessory muscle use.  Cardiovascular: Regular rate and rhythm, no murmurs / rubs / gallops. No extremity edema. 2+ pedal pulses. No carotid bruits.  Abdomen: epigastric tenderness, no masses palpated. No hepatosplenomegaly. Bowel sounds positive.  Musculoskeletal: no clubbing / cyanosis. No joint deformity upper and lower extremities. Good ROM, no contractures. Normal muscle tone.  Skin: no rashes, lesions, ulcers. No induration Neurologic: CN grossly intact. Sensation intact, . Strength 5/5 in all 4.  Psychiatric: Normal judgment and insight. Alert and oriented x 3. Normal mood.     Data Reviewed:    Latest Ref Rng & Units 08/25/2023   10:20 AM 08/24/2023    1:20 PM 08/24/2023    4:47  AM  CBC  WBC 4.0 - 10.5 K/uL 9.9   10.3   Hemoglobin 13.0 - 17.0 g/dL 85.2  85.6  86.5   Hematocrit 39.0 - 52.0 % 44.4  40.9  38.9   Platelets 150 - 400 K/uL 238   235        Latest Ref Rng & Units 08/25/2023   10:20 AM 08/24/2023    4:47 AM 08/23/2023    6:38 PM  BMP  Glucose 70 - 99 mg/dL 94  90  92   BUN 6 - 20 mg/dL 6  10  9    Creatinine 0.61 - 1.24  mg/dL 9.31  9.28  9.26   Sodium 135 - 145 mmol/L 136  135  139   Potassium 3.5 - 5.1 mmol/L 3.7  3.3  3.1   Chloride 98 - 111 mmol/L 105  103  103   CO2 22 - 32 mmol/L 23  22  24    Calcium 8.9 - 10.3 mg/dL 8.8  8.5  9.2     Vitals:   08/24/23 1615 08/24/23 2010 08/25/23 0549 08/25/23 1226  BP: (!) 143/80 137/81 123/68 (!) 160/86  Pulse: (!) 59 (!) 57 (!) 59 (!) 53  Resp: 18 18 20  (!) 27  Temp: 98.3 F (36.8 C) 98.9 F (37.2 C) 98.1 F (36.7 C) 98.3 F (36.8 C)  TempSrc: Oral Oral Oral Temporal  SpO2: 98% 97% 98% 95%  Weight:   106 kg 106 kg  Height:    6' 3 (1.905 m)    Disposition: Home when medically stable  Time spent: 40 minutes  Author: Drue ONEIDA Potter, MD 08/25/2023 1:45 PM  For on call review [email or web address].

## 2023-08-25 NOTE — Op Note (Signed)
 [facility] Patient Name: [name] Procedure Date: 08/25/2023 MRN: [id number] Attending MD: Estefana Keas DO, DO, [id number] Date of Birth: [birth year] CSN: [id number] Age: 33 Admit Type: Outpatient Procedure:                Upper GI endoscopy Indications:              Hematemesis, Melena Providers:                Estefana Keas DO, DO, Hoy Penner, RN,                            [facility] [place], Technician, Nancee Crass, CRNA Referring MD:              Medicines:                See the Anesthesia note for documentation of the                            administered medications Complications:            No immediate complications. Estimated Blood Loss:     Estimated blood loss was minimal. Procedure:                Pre-Anesthesia Assessment:                           - ASA Grade Assessment: II - A patient with mild                            systemic disease.                           - The risks and benefits of the procedure and the                            sedation options and risks were discussed with the                            patient. All questions were answered and informed                            consent was obtained.                           After obtaining informed consent, the endoscope was                            passed under direct vision. Throughout the                            procedure, the patient's blood pressure, pulse, and                            oxygen saturations were monitored continuously. The                            [id number]-[id number] ([id number]) Olympus endoscope was  introduced                            through the mouth, and advanced to the second part                            of duodenum. The upper GI endoscopy was                            accomplished without difficulty. The patient                            tolerated the procedure well. Scope In: Scope Out: Findings:      The Z-line was regular and was  found 39 cm from the incisors.      Diffuse mild inflammation characterized by congestion (edema) and       erythema was found in the cardia and in the gastric body. Biopsies were       taken with a cold forceps for Helicobacter pylori testing.      One non-bleeding cratered duodenal ulcer with a clean ulcer base       (Forrest Class III) was found in the second portion of the duodenum. The       lesion was 4 mm in largest dimension. Impression:               - Z-line regular, 39 cm from the incisors.                           - Gastritis. Biopsied.                           - Non-bleeding duodenal ulcer with a clean ulcer                            base (Forrest Class III). Moderate Sedation:      See the other procedure note for documentation of moderate sedation with       intraservice time. Recommendation:           - Return patient to hospital ward for possible                            discharge same day.                           - Resume regular diet.                           - Continue present medications.                           - Continue oral PPI once per day for 2 months.                           - Start sucralfate  1 gm PO TID x 7 days.                           -  Await pathology results.                           - Avoid NSAIDs. Use tylenol  for pain.                           - Follow up with [name] in office in 6-8 weeks. Procedure Code(s):        --- Professional ---                           [phone], Esophagogastroduodenoscopy, flexible,                            transoral; with biopsy, single or multiple Diagnosis Code(s):        --- Professional ---                           K29.70, Gastritis, unspecified, without bleeding                           K26.9, Duodenal ulcer, unspecified as acute or                            chronic, without hemorrhage or perforation                           K92.0, Hematemesis                           K92.1, Melena (includes  Hematochezia) CPT copyright 2022 American Medical Association. All rights reserved. The codes documented in this report are preliminary and upon coder review may  be revised to meet current compliance requirements. Dr Estefana Keas, DO Estefana Keas DO, DO 08/25/2023 1:44:02 PM Number of Addenda: 0

## 2023-08-25 NOTE — Anesthesia Postprocedure Evaluation (Signed)
 Anesthesia Post Note  Patient: [name]  Procedure(s) Performed: EGD (ESOPHAGOGASTRODUODENOSCOPY)     Patient location during evaluation: PACU Anesthesia Type: MAC Level of consciousness: awake and alert Pain management: pain level controlled Vital Signs Assessment: post-procedure vital signs reviewed and stable Respiratory status: spontaneous breathing, nonlabored ventilation, respiratory function stable and patient connected to nasal cannula oxygen Cardiovascular status: stable and blood pressure returned to baseline Postop Assessment: no apparent nausea or vomiting Anesthetic complications: no   No notable events documented.  Last Vitals:  Vitals:   08/25/23 1415 08/25/23 1444  BP:  126/69  Pulse: (!) 58 (!) 52  Resp: 16 20  Temp:  36.5 C  SpO2: 98% 97%    Last Pain:  Vitals:   08/25/23 1444  TempSrc: Oral  PainSc:                  Cordella SQUIBB Ezechiel [place]

## 2023-08-25 NOTE — Interval H&P Note (Signed)
 History and Physical Interval Note:  08/25/2023 1:18 PM  [name]  has presented today for surgery, with the diagnosis of Hematemesis, melena.  The various methods of treatment have been discussed with the patient and family. After consideration of risks, benefits and other options for treatment, the patient has consented to  Procedure(s): EGD (ESOPHAGOGASTRODUODENOSCOPY) (N/A) as a surgical intervention.  The patient's history has been reviewed, patient examined, no change in status, stable for surgery.  I have reviewed the patient's chart and labs.  Questions were answered to the patient's satisfaction.     [name]

## 2023-08-25 NOTE — Anesthesia Preprocedure Evaluation (Addendum)
 Anesthesia Evaluation  Patient identified by MRN, date of birth, ID band Patient awake    Reviewed: Allergy & Precautions, H&P , Patient's Chart, lab work & pertinent test results  Airway Mallampati: II  TM Distance: >3 FB Neck ROM: Full    Dental no notable dental hx.    Pulmonary Current Smoker and Patient abstained from smoking.   Pulmonary exam normal        Cardiovascular Exercise Tolerance: Good negative cardio ROS  Rhythm:Regular Rate:Normal     Neuro/Psych  PSYCHIATRIC DISORDERS (ADHD)      negative neurological ROS     GI/Hepatic Neg liver ROS,GERD  Medicated,,  Endo/Other    Class 3 obesityBMI 29  Renal/GU negative Renal ROS  negative genitourinary   Musculoskeletal   Abdominal Normal abdominal exam  (+)   Peds  Hematology negative hematology ROS (+) Hb 14.7, plt 238k   Anesthesia Other Findings   Reproductive/Obstetrics negative OB ROS                              Anesthesia Physical Anesthesia Plan  ASA: 2  Anesthesia Plan: MAC   Post-op Pain Management: Minimal or no pain anticipated   Induction: Intravenous  PONV Risk Score and Plan: Propofol  infusion  Airway Management Planned: Natural Airway and Simple Face Mask  Additional Equipment: None  Intra-op Plan:   Post-operative Plan:   Informed Consent: I have reviewed the patients History and Physical, chart, labs and discussed the procedure including the risks, benefits and alternatives for the proposed anesthesia with the patient or authorized representative who has indicated his/her understanding and acceptance.       Plan Discussed with: Anesthesiologist and CRNA  Anesthesia Plan Comments:          Anesthesia Quick Evaluation

## 2023-08-26 ENCOUNTER — Encounter: Admitting: Podiatry

## 2023-08-26 ENCOUNTER — Other Ambulatory Visit (HOSPITAL_COMMUNITY): Payer: Self-pay

## 2023-08-26 ENCOUNTER — Encounter (HOSPITAL_COMMUNITY): Payer: Self-pay | Admitting: Internal Medicine

## 2023-08-26 ENCOUNTER — Other Ambulatory Visit: Payer: Self-pay

## 2023-08-26 ENCOUNTER — Other Ambulatory Visit (HOSPITAL_BASED_OUTPATIENT_CLINIC_OR_DEPARTMENT_OTHER): Payer: Self-pay

## 2023-08-26 DIAGNOSIS — T39395A Adverse effect of other nonsteroidal anti-inflammatory drugs [NSAID], initial encounter: Secondary | ICD-10-CM

## 2023-08-26 DIAGNOSIS — M79671 Pain in right foot: Secondary | ICD-10-CM | POA: Diagnosis not present

## 2023-08-26 DIAGNOSIS — K269 Duodenal ulcer, unspecified as acute or chronic, without hemorrhage or perforation: Secondary | ICD-10-CM | POA: Diagnosis not present

## 2023-08-26 DIAGNOSIS — R112 Nausea with vomiting, unspecified: Secondary | ICD-10-CM | POA: Diagnosis not present

## 2023-08-26 LAB — CBC WITH DIFFERENTIAL/PLATELET
Abs Immature Granulocytes: 0.07 K/uL (ref 0.00–0.07)
Basophils Absolute: 0 K/uL (ref 0.0–0.1)
Basophils Relative: 0 %
Eosinophils Absolute: 0.1 K/uL (ref 0.0–0.5)
Eosinophils Relative: 1 %
HCT: 44 % (ref 39.0–52.0)
Hemoglobin: 14.6 g/dL (ref 13.0–17.0)
Immature Granulocytes: 1 %
Lymphocytes Relative: 32 %
Lymphs Abs: 3.8 K/uL (ref 0.7–4.0)
MCH: 30.8 pg (ref 26.0–34.0)
MCHC: 33.2 g/dL (ref 30.0–36.0)
MCV: 92.8 fL (ref 80.0–100.0)
Monocytes Absolute: 1 K/uL (ref 0.1–1.0)
Monocytes Relative: 8 %
Neutro Abs: 7 K/uL (ref 1.7–7.7)
Neutrophils Relative %: 58 %
Platelets: 235 K/uL (ref 150–400)
RBC: 4.74 MIL/uL (ref 4.22–5.81)
RDW: 12.1 % (ref 11.5–15.5)
WBC: 11.9 K/uL — ABNORMAL HIGH (ref 4.0–10.5)
nRBC: 0 % (ref 0.0–0.2)

## 2023-08-26 LAB — COMPREHENSIVE METABOLIC PANEL WITH GFR
ALT: 33 U/L (ref 0–44)
AST: 14 U/L — ABNORMAL LOW (ref 15–41)
Albumin: 3.8 g/dL (ref 3.5–5.0)
Alkaline Phosphatase: 47 U/L (ref 38–126)
Anion gap: 6 (ref 5–15)
BUN: 8 mg/dL (ref 6–20)
CO2: 23 mmol/L (ref 22–32)
Calcium: 8.9 mg/dL (ref 8.9–10.3)
Chloride: 105 mmol/L (ref 98–111)
Creatinine, Ser: 0.83 mg/dL (ref 0.61–1.24)
GFR, Estimated: >60 mL/min (ref >60)
Glucose, Bld: 91 mg/dL (ref 70–99)
Potassium: 3.3 mmol/L — ABNORMAL LOW (ref 3.5–5.1)
Sodium: 134 mmol/L — ABNORMAL LOW (ref 135–145)
Total Bilirubin: 0.8 mg/dL (ref 0.0–1.2)
Total Protein: 6.4 g/dL — ABNORMAL LOW (ref 6.5–8.1)

## 2023-08-26 MED ORDER — PROMETHAZINE HCL 25 MG PO TABS
25.0000 mg | ORAL_TABLET | Freq: Four times a day (QID) | ORAL | 0 refills | Status: DC | PRN
  Filled 2023-08-26: qty 30, 8d supply, fill #0

## 2023-08-26 MED ORDER — ONDANSETRON HCL 4 MG PO TABS
4.0000 mg | ORAL_TABLET | Freq: Four times a day (QID) | ORAL | 0 refills | Status: DC | PRN
  Filled 2023-08-26: qty 30, 8d supply, fill #0

## 2023-08-26 MED ORDER — PANTOPRAZOLE SODIUM 40 MG PO TBEC
40.0000 mg | DELAYED_RELEASE_TABLET | Freq: Two times a day (BID) | ORAL | 0 refills | Status: DC
  Filled 2023-08-26: qty 180, 90d supply, fill #0

## 2023-08-26 MED ORDER — SUCRALFATE 1 G PO TABS
1.0000 g | ORAL_TABLET | Freq: Three times a day (TID) | ORAL | 0 refills | Status: DC
  Filled 2023-08-26: qty 360, 90d supply, fill #0
  Filled 2023-08-26: qty 120, 30d supply, fill #0

## 2023-08-26 MED ORDER — OXYCODONE HCL 10 MG PO TABS
10.0000 mg | ORAL_TABLET | Freq: Four times a day (QID) | ORAL | 0 refills | Status: DC | PRN
  Filled 2023-08-26: qty 12, 3d supply, fill #0

## 2023-08-26 NOTE — Progress Notes (Signed)
 PROGRESS NOTE    [name]  FMW:[id number] DOB: Jul 02, 1990 DOA: 08/23/2023 PCP: Sun, Vyvyan, MD  Subjective: Pt seen and examined. Slightly nauseated with breakfast this AM but no vomiting. He thinks he is able to go home today.   Hospital Course: HPI: [name] is a 33 y.o. male with medical history significant of  ADHD whooping cough, who presents to ED with intractable n/v. Of note patient has interim history of admission for the same 7/24-7/26 at the initial onset of his symptoms. At that time history notes patient symptoms began after eating shrimp alfredo pasta dish that was re-heated. Patient was admitted  with diagnosis of gastroenteritis due to food borne illness. AT that time he was also noted to have + FOBT but noted brown stools. He at that time endorsed increase nsaid use after recent ankle surgery.Patient treated supportively with ppi , anti-emetics and was discharged.  Patient s/p discharge had recurrent symptoms and returned to ED AM of 7/27. He was evaluated has repeat CT scan which noted no acute findings. Patient was treated supportively and discharged home. However this pm patient returns with continues symptoms despite oral medication given at home. He also notes episode of hematemesis.Patient has since has emesis in ED w/o note of hematemesis.    Significant Events: Admitted 08/23/2023 recurrent N/V 08-25-2023 EGD Gastritis. Biopsied.  Non-bleeding duodenal ulcer with a clean ulcer  base (Forrest Class III).  Admission Labs: WBC 9.1, HgB 14.6, plt 244 Na 138, K 3.3, CO2 of 25, BUN 10, Scr 0.76, glu 101 T prot 7.1, alb 4.3, AST 29, ALT 58, alk phos 51, T. Bili 1.1 Lipase 32 UDS + opiates, + THC  Admission Imaging Studies:   Significant Labs: 08-24-2023 procal < 0.10, lactic acid 0.8, CRP 0.5, ESR 2  Significant Imaging Studies:   Antibiotic Therapy: Anti-infectives (From admission, onward)    None      Procedures: 08-25-2023 EGD Gastritis.  Biopsied.  Non-bleeding duodenal ulcer with a clean ulcer  base (Forrest Class III).  Consultants: GI    Assessment and Plan: * Intractable nausea and vomiting Prior to 08-26-2023 Patient with multiple recent admissions GI panel obtained 3 days ago was normal Continue IV fluids Continue as needed Zofran , Reglan  - continue with ppi  I have discussed with gastroenterologist on board today EGD done today showed duodenal ulcer, clean based, likely related to NSAID use and biopsies taken GI is okay for advancing diet today as tolerated, PPI twice per day for 2 months, sucralfate  TID for next 7 days.  Needs outpatient follow up with [name] in 6-8 weeks.  08-26-2023 resolved. Likely due to duodenal ulcer.  Right foot pain 08-26-2023 pt f/u with Dr. Janit with podiatry due to prior stress fracture in his right foot. Work note and 3 days of oxycodone  provided to patient. Pt warned about addictive potential of opiates.  Duodenal ulcer due to nonsteroidal anti-inflammatory drug (NSAID) 08-26-2023 EGD shows duodenal ulcer. DC to home with BID protonix  and Carafate  QID. F/u with GI in 6-8 weeks.   DVT prophylaxis: SCDs Start: 08/23/23 2120    Code Status: Full Code Family Communication: no family at bedside. He is decisional Disposition Plan: home Reason for continuing need for hospitalization: stable for DC  Objective: Vitals:   08/25/23 1444 08/25/23 1943 08/26/23 0354 08/26/23 0500  BP: 126/69 135/84 113/62   Pulse: (!) 52 65 63   Resp: 20 16 17    Temp: 97.7 F (36.5 C) 98.2 F (36.8 C) 98.7  F (37.1 C)   TempSrc: Oral Oral Oral   SpO2: 97% 96% 95%   Weight:    102.8 kg  Height:        Intake/Output Summary (Last 24 hours) at 08/26/2023 1031 Last data filed at 08/26/2023 1015 Gross per 24 hour  Intake 1150 ml  Output 725 ml  Net 425 ml   Filed Weights   08/25/23 0549 08/25/23 1226 08/26/23 0500  Weight: 106 kg 106 kg 102.8 kg    Examination:  Physical Exam Vitals  reviewed.  Constitutional:      General: He is not in acute distress.    Appearance: He is normal weight. He is not ill-appearing, toxic-appearing or diaphoretic.  HENT:     Head: Normocephalic and atraumatic.     Nose: Nose normal.  Eyes:     General: No scleral icterus. Pulmonary:     Effort: Pulmonary effort is normal.  Abdominal:     General: There is no distension.  Skin:    Capillary Refill: Capillary refill takes less than 2 seconds.  Neurological:     General: No focal deficit present.     Mental Status: He is alert and oriented to person, place, and time.     Data Reviewed: I have personally reviewed following labs and imaging studies  CBC: Recent Labs  Lab 08/20/23 0950 08/21/23 0413 08/23/23 0937 08/23/23 1838 08/24/23 0041 08/24/23 0447 08/24/23 1320 08/25/23 1020 08/26/23 0520  WBC 14.6*   < > 9.1 9.2  --  10.3  --  9.9 11.9*  NEUTROABS 11.4*  --  6.2 6.2  --   --   --  7.4 7.0  HGB 16.6   < > 14.6 14.4 13.7 13.4 14.3 14.7 14.6  HCT 46.7   < > 42.6 41.3 39.1 38.9* 40.9 44.4 44.0  MCV 89.3   < > 89.7 90.6  --  90.5  --  93.9 92.8  PLT 291   < > 244 261  --  235  --  238 235   < > = values in this interval not displayed.   Basic Metabolic Panel: Recent Labs  Lab 08/23/23 0937 08/23/23 1838 08/23/23 2012 08/24/23 0447 08/25/23 1020 08/26/23 0520  NA 138 139  --  135 136 134*  K 3.3* 3.1*  --  3.3* 3.7 3.3*  CL 101 103  --  103 105 105  CO2 25 24  --  22 23 23   GLUCOSE 101* 92  --  90 94 91  BUN 10 9  --  10 6 8   CREATININE 0.76 0.73  --  0.71 0.68 0.83  CALCIUM 9.2 9.2  --  8.5* 8.8* 8.9  MG  --   --  2.5*  --   --   --    GFR: Estimated Creatinine Clearance: 162.8 mL/min (by C-G formula based on SCr of 0.83 mg/dL). Liver Function Tests: Recent Labs  Lab 08/21/23 0413 08/23/23 0937 08/23/23 1838 08/24/23 0447 08/26/23 0520  AST 28 29 25 17  14*  ALT 38 58* 57* 45* 33  ALKPHOS 47 51 53 46 47  BILITOT 1.2 1.1 1.2 1.1 0.8  PROT 6.8 7.1  7.2 6.3* 6.4*  ALBUMIN 3.9 4.3 4.0 3.7 3.8   Recent Labs  Lab 08/20/23 0950 08/23/23 0937 08/23/23 1838  LIPASE 18 32 34   Coagulation Profile: Recent Labs  Lab 08/24/23 0447  INR 1.2   Sepsis Labs: Recent Labs  Lab 08/24/23 0041 08/24/23 0329  PROCALCITON <0.10  --   LATICACIDVEN 0.8 0.7    Recent Results (from the past 240 hours)  Resp panel by RT-PCR (RSV, Flu A&B, Covid) Anterior Nasal Swab     Status: None   Collection Time: 08/18/23 10:54 PM   Specimen: Anterior Nasal Swab  Result Value Ref Range Status   SARS Coronavirus 2 by RT PCR NEGATIVE NEGATIVE Final    Comment: (NOTE) SARS-CoV-2 target nucleic acids are NOT DETECTED.  The SARS-CoV-2 RNA is generally detectable in upper respiratory specimens during the acute phase of infection. The lowest concentration of SARS-CoV-2 viral copies this assay can detect is 138 copies/mL. A negative result does not preclude SARS-Cov-2 infection and should not be used as the sole basis for treatment or other patient management decisions. A negative result may occur with  improper specimen collection/handling, submission of specimen other than nasopharyngeal swab, presence of viral mutation(s) within the areas targeted by this assay, and inadequate number of viral copies(<138 copies/mL). A negative result must be combined with clinical observations, patient history, and epidemiological information. The expected result is Negative.  Fact Sheet for Patients:  BloggerCourse.com  Fact Sheet for Healthcare Providers:  SeriousBroker.it  This test is no t yet approved or cleared by the [place]  FDA and  has been authorized for detection and/or diagnosis of SARS-CoV-2 by FDA under an Emergency Use Authorization (EUA). This EUA will remain  in effect (meaning this test can be used) for the duration of the COVID-19 declaration under Section 564(b)(1) of the Act,  21 U.S.C.section 360bbb-3(b)(1), unless the authorization is terminated  or revoked sooner.       Influenza A by PCR NEGATIVE NEGATIVE Final   Influenza B by PCR NEGATIVE NEGATIVE Final    Comment: (NOTE) The Xpert Xpress SARS-CoV-2/FLU/RSV plus assay is intended as an aid in the diagnosis of influenza from Nasopharyngeal swab specimens and should not be used as a sole basis for treatment. Nasal washings and aspirates are unacceptable for Xpert Xpress SARS-CoV-2/FLU/RSV testing.  Fact Sheet for Patients: BloggerCourse.com  Fact Sheet for Healthcare Providers: SeriousBroker.it  This test is not yet approved or cleared by the [place]  FDA and has been authorized for detection and/or diagnosis of SARS-CoV-2 by FDA under an Emergency Use Authorization (EUA). This EUA will remain in effect (meaning this test can be used) for the duration of the COVID-19 declaration under Section 564(b)(1) of the Act, 21 U.S.C. section 360bbb-3(b)(1), unless the authorization is terminated or revoked.     Resp Syncytial Virus by PCR NEGATIVE NEGATIVE Final    Comment: (NOTE) Fact Sheet for Patients: BloggerCourse.com  Fact Sheet for Healthcare Providers: SeriousBroker.it  This test is not yet approved or cleared by the [place]  FDA and has been authorized for detection and/or diagnosis of SARS-CoV-2 by FDA under an Emergency Use Authorization (EUA). This EUA will remain in effect (meaning this test can be used) for the duration of the COVID-19 declaration under Section 564(b)(1) of the Act, 21 U.S.C. section 360bbb-3(b)(1), unless the authorization is terminated or revoked.  Performed at [facility], [address] [place], [place] [address]   Gastrointestinal Panel by PCR , Stool     Status: None   Collection Time: 08/21/23  3:51 AM   Specimen: Stool  Result  Value Ref Range Status   Campylobacter species NOT DETECTED NOT DETECTED Final   Plesimonas shigelloides NOT DETECTED NOT DETECTED Final   Salmonella species NOT DETECTED NOT DETECTED Final   Yersinia enterocolitica  NOT DETECTED NOT DETECTED Final   Vibrio species NOT DETECTED NOT DETECTED Final   Vibrio cholerae NOT DETECTED NOT DETECTED Final   Enteroaggregative E coli (EAEC) NOT DETECTED NOT DETECTED Final   Enteropathogenic E coli (EPEC) NOT DETECTED NOT DETECTED Final   Enterotoxigenic E coli (ETEC) NOT DETECTED NOT DETECTED Final   Shiga like toxin producing E coli (STEC) NOT DETECTED NOT DETECTED Final   Shigella/Enteroinvasive E coli (EIEC) NOT DETECTED NOT DETECTED Final   Cryptosporidium NOT DETECTED NOT DETECTED Final   Cyclospora cayetanensis NOT DETECTED NOT DETECTED Final   Entamoeba histolytica NOT DETECTED NOT DETECTED Final   Giardia lamblia NOT DETECTED NOT DETECTED Final   Adenovirus F40/41 NOT DETECTED NOT DETECTED Final   Astrovirus NOT DETECTED NOT DETECTED Final   Norovirus GI/GII NOT DETECTED NOT DETECTED Final   Rotavirus A NOT DETECTED NOT DETECTED Final   Sapovirus (I, II, IV, and V) NOT DETECTED NOT DETECTED Final    Comment: Performed at [facility], [address] [place], [place] [address]     Scheduled Meds:  feeding supplement  1 Container Oral BID BM   metoCLOPramide   10 mg Oral TID AC   pantoprazole  (PROTONIX ) IV  40 mg Intravenous Q12H   sucralfate   1 g Oral TID WC & HS   Continuous Infusions:   LOS: 3 days   Time spent: 55 minutes  Camellia Door, DO  [facility]  08/26/2023, 10:31 AM

## 2023-08-26 NOTE — Subjective & Objective (Signed)
 Pt seen and examined. Slightly nauseated with breakfast this AM but no vomiting. He thinks he is able to go home today.

## 2023-08-26 NOTE — Assessment & Plan Note (Signed)
 08-26-2023 pt instructed to eat bananas when he gets home.

## 2023-08-26 NOTE — TOC Transition Note (Signed)
 Transition of Care [facility]) - Discharge Note   Patient Details  Name: [name] MRN: [id number] Date of Birth: 04-22-1990  [facility] [place] CM/SW Contact:  Bascom Service, RN Phone Number: 08/26/2023, 10:53 AM   Clinical Narrative: d/c home.      Final next level of care: Home/Self Care Barriers to Discharge: No Barriers Identified   Patient Goals and CMS Choice Patient states their goals for this hospitalization and ongoing recovery are:: Home CMS Medicare.gov Compare Post Acute Care list provided to:: Patient Choice offered to / list presented to : Patient [place] ownership interest in [facility].provided to:: Patient    Discharge Placement                       Discharge Plan and Services Additional resources added to the After Visit Summary for     Discharge Planning Services: CM Consult                                 Social Drivers of Health (SDOH) Interventions SDOH Screenings   Food Insecurity: No Food Insecurity (08/24/2023)  Housing: Low Risk  (08/24/2023)  Transportation Needs: No Transportation Needs (08/24/2023)  Utilities: Not At Risk (08/24/2023)  Recent Concern: Utilities - At Risk (08/20/2023)  Tobacco Use: High Risk (08/24/2023)     Readmission Risk Interventions     No data to display

## 2023-08-26 NOTE — Hospital Course (Signed)
 HPI: [name] is a 33 y.o. male with medical history significant of  ADHD whooping cough, who presents to ED with intractable n/v. Of note patient has interim history of admission for the same 7/24-7/26 at the initial onset of his symptoms. At that time history notes patient symptoms began after eating shrimp alfredo pasta dish that was re-heated. Patient was admitted  with diagnosis of gastroenteritis due to food borne illness. AT that time he was also noted to have + FOBT but noted brown stools. He at that time endorsed increase nsaid use after recent ankle surgery.Patient treated supportively with ppi , anti-emetics and was discharged.  Patient s/p discharge had recurrent symptoms and returned to ED AM of 7/27. He was evaluated has repeat CT scan which noted no acute findings. Patient was treated supportively and discharged home. However this pm patient returns with continues symptoms despite oral medication given at home. He also notes episode of hematemesis.Patient has since has emesis in ED w/o note of hematemesis.    Significant Events: Admitted 08/23/2023 recurrent N/V 08-25-2023 EGD Gastritis. Biopsied.  Non-bleeding duodenal ulcer with a clean ulcer  base (Forrest Class III).  Admission Labs: WBC 9.1, HgB 14.6, plt 244 Na 138, K 3.3, CO2 of 25, BUN 10, Scr 0.76, glu 101 T prot 7.1, alb 4.3, AST 29, ALT 58, alk phos 51, T. Bili 1.1 Lipase 32 UDS + opiates, + THC  Admission Imaging Studies:   Significant Labs: 08-24-2023 procal < 0.10, lactic acid 0.8, CRP 0.5, ESR 2  Significant Imaging Studies:   Antibiotic Therapy: Anti-infectives (From admission, onward)    None      Procedures: 08-25-2023 EGD Gastritis. Biopsied.  Non-bleeding duodenal ulcer with a clean ulcer  base (Forrest Class III).  Consultants: GI

## 2023-08-26 NOTE — Assessment & Plan Note (Signed)
 08-26-2023 pt f/u with Dr. Janit with podiatry due to prior stress fracture in his right foot. Work note and 3 days of oxycodone  provided to patient. Pt warned about addictive potential of opiates.

## 2023-08-26 NOTE — Assessment & Plan Note (Signed)
 Prior to 08-26-2023 Patient with multiple recent admissions GI panel obtained 3 days ago was normal Continue IV fluids Continue as needed Zofran , Reglan  - continue with ppi  I have discussed with gastroenterologist on board today EGD done today showed duodenal ulcer, clean based, likely related to NSAID use and biopsies taken GI is okay for advancing diet today as tolerated, PPI twice per day for 2 months, sucralfate  TID for next 7 days.  Needs outpatient follow up with [name] in 6-8 weeks.  08-26-2023 resolved. Likely due to duodenal ulcer.

## 2023-08-26 NOTE — Progress Notes (Signed)
 Discharge instructions given to patient questions asked and answered. Discharge medications delivered to patient in a secure bag to the bedside [employer]

## 2023-08-26 NOTE — Discharge Summary (Addendum)
 [facility] Phys[facility] Discharge Summary   Patient name: [name]  Admit date:     08/23/2023  Discharge date: 08/26/2023  Attending Physician: DEBBY CAMILA LABOR [[id number]]  Discharge Physician: Camellia Door   PCP: Sun, Vyvyan, MD  Admitted From: Home  Disposition:  Home  Recommendations for Outpatient Follow-up:  Follow up with PCP in 1-2 weeks Follow up with [name] in 6-8 weeks Please follow up on the following pending results: gastric/duodenal biopsy  Home Health:No Equipment/Devices: None    Discharge Condition:Stable CODE STATUS:FULL Diet recommendation: Heart Healthy Fluid Restriction: None  Hospital Summary: HPI: [name] is a 33 y.o. male with medical history significant of  ADHD whooping cough, who presents to ED with intractable n/v. Of note patient has interim history of admission for the same 7/24-7/26 at the initial onset of his symptoms. At that time history notes patient symptoms began after eating shrimp alfredo pasta dish that was re-heated. Patient was admitted  with diagnosis of gastroenteritis due to food borne illness. AT that time he was also noted to have + FOBT but noted brown stools. He at that time endorsed increase nsaid use after recent ankle surgery.Patient treated supportively with ppi , anti-emetics and was discharged.  Patient s/p discharge had recurrent symptoms and returned to ED AM of 7/27. He was evaluated has repeat CT scan which noted no acute findings. Patient was treated supportively and discharged home. However this pm patient returns with continues symptoms despite oral medication given at home. He also notes episode of hematemesis.Patient has since has emesis in ED w/o note of hematemesis.    Significant Events: Admitted 08/23/2023 recurrent N/V 08-25-2023 EGD Gastritis. Biopsied.  Non-bleeding duodenal ulcer with a clean ulcer  base (Forrest Class III).  Admission Labs: WBC 9.1, HgB 14.6, plt 244 Na 138, K 3.3, CO2  of 25, BUN 10, Scr 0.76, glu 101 T prot 7.1, alb 4.3, AST 29, ALT 58, alk phos 51, T. Bili 1.1 Lipase 32 UDS + opiates, + THC  Admission Imaging Studies:   Significant Labs: 08-24-2023 procal < 0.10, lactic acid 0.8, CRP 0.5, ESR 2  Significant Imaging Studies:   Antibiotic Therapy: Anti-infectives (From admission, onward)    None      Procedures: 08-25-2023 EGD Gastritis. Biopsied.  Non-bleeding duodenal ulcer with a clean ulcer  base (Forrest Class III).  Consultants: GI   Hospital Course by Problem: * Intractable nausea and vomiting Prior to 08-26-2023 Patient with multiple recent admissions GI panel obtained 3 days ago was normal Continue IV fluids Continue as needed Zofran , Reglan  - continue with ppi  I have discussed with gastroenterologist on board today EGD done today showed duodenal ulcer, clean based, likely related to NSAID use and biopsies taken GI is okay for advancing diet today as tolerated, PPI twice per day for 2 months, sucralfate  TID for next 7 days.  Needs outpatient follow up with [name] in 6-8 weeks.  08-26-2023 resolved. Likely due to duodenal ulcer.  Right foot pain 08-26-2023 pt f/u with Dr. Janit with podiatry due to prior stress fracture in his right foot. Work note and 3 days of oxycodone  provided to patient. Pt warned about addictive potential of opiates.  Duodenal ulcer due to nonsteroidal anti-inflammatory drug (NSAID) 08-26-2023 EGD shows duodenal ulcer. DC to home with BID protonix  and Carafate  QID. F/u with GI in 6-8 weeks.  Hypokalemia 08-26-2023 pt instructed to eat bananas when he gets home.    Discharge Diagnoses:  Principal Problem:  Intractable nausea and vomiting Active Problems:   Hypokalemia   Duodenal ulcer due to nonsteroidal anti-inflammatory drug (NSAID)   Right foot pain   Discharge Instructions  Discharge Instructions     Call MD for:  difficulty breathing, headache or visual disturbances   Complete by:  As directed    Call MD for:  extreme fatigue   Complete by: As directed    Call MD for:  hives   Complete by: As directed    Call MD for:  persistant dizziness or light-headedness   Complete by: As directed    Call MD for:  persistant nausea and vomiting   Complete by: As directed    Call MD for:  redness, tenderness, or signs of infection (pain, swelling, redness, odor or green/yellow discharge around incision site)   Complete by: As directed    Call MD for:  severe uncontrolled pain   Complete by: As directed    Call MD for:  temperature >100.4   Complete by: As directed    Diet - low sodium heart healthy   Complete by: As directed    Discharge instructions   Complete by: As directed    1. Follow up with your primary care provider in 1-2 weeks following discharge from hospital. 2. Follow up with [name] in 6-8 weeks. Call office for appointment.   Increase activity slowly   Complete by: As directed       Allergies as of 08/26/2023   No Known Allergies      Medication List     TAKE these medications    amphetamine-dextroamphetamine 30 MG tablet Commonly known as: ADDERALL Take 1 tablet by mouth 2 (two) times daily.   ondansetron  4 MG tablet Commonly known as: Zofran  Take 1 tablet (4 mg total) by mouth every 6 (six) hours as needed for nausea or vomiting. What changed: when to take this   Oxycodone  HCl 10 MG Tabs Take 1 tablet (10 mg total) by mouth every 6 (six) hours as needed for up to 3 days for breakthrough pain or moderate pain (pain score 4-6).   pantoprazole  40 MG tablet Commonly known as: Protonix  Take 1 tablet (40 mg total) by mouth 2 (two) times daily.   promethazine  25 MG tablet Commonly known as: PHENERGAN  Take 1 tablet (25 mg total) by mouth every 6 (six) hours as needed for nausea or vomiting.   sucralfate  1 g tablet Commonly known as: CARAFATE  Take 1 tablet (1 g total) by mouth 4 (four) times daily -  with meals and at bedtime.         Follow-up Information     Kriss Estefana DEL, DO. Schedule an appointment as soon as possible for a visit in 6 week(s).   Specialty: Gastroenterology Contact information: [address] [place] [address] [phone]                No Known Allergies  Discharge Exam: Vitals:   08/25/23 1943 08/26/23 0354  BP: 135/84 113/62  Pulse: 65 63  Resp: 16 17  Temp: 98.2 F (36.8 C) 98.7 F (37.1 C)  SpO2: 96% 95%    Physical Exam Vitals reviewed.  Constitutional:      General: He is not in acute distress.    Appearance: He is normal weight. He is not ill-appearing, toxic-appearing or diaphoretic.  HENT:     Head: Normocephalic and atraumatic.     Nose: Nose normal.  Eyes:  General: No scleral icterus. Pulmonary:     Effort: Pulmonary effort is normal.  Abdominal:     General: There is no distension.  Skin:    Capillary Refill: Capillary refill takes less than 2 seconds.  Neurological:     General: No focal deficit present.     Mental Status: He is alert and oriented to person, place, and time.     The results of significant diagnostics from this hospitalization (including imaging, microbiology, ancillary and laboratory) are listed below for reference.    Microbiology: Recent Results (from the past 240 hours)  Resp panel by RT-PCR (RSV, Flu A&B, Covid) Anterior Nasal Swab     Status: None   Collection Time: 08/18/23 10:54 PM   Specimen: Anterior Nasal Swab  Result Value Ref Range Status   SARS Coronavirus 2 by RT PCR NEGATIVE NEGATIVE Final    Comment: (NOTE) SARS-CoV-2 target nucleic acids are NOT DETECTED.  The SARS-CoV-2 RNA is generally detectable in upper respiratory specimens during the acute phase of infection. The lowest concentration of SARS-CoV-2 viral copies this assay can detect is 138 copies/mL. A negative result does not preclude SARS-Cov-2 infection and should not be used as the sole basis for treatment or other patient  management decisions. A negative result may occur with  improper specimen collection/handling, submission of specimen other than nasopharyngeal swab, presence of viral mutation(s) within the areas targeted by this assay, and inadequate number of viral copies(<138 copies/mL). A negative result must be combined with clinical observations, patient history, and epidemiological information. The expected result is Negative.  Fact Sheet for Patients:  BloggerCourse.com  Fact Sheet for Healthcare Providers:  SeriousBroker.it  This test is no t yet approved or cleared by the [place]  FDA and  has been authorized for detection and/or diagnosis of SARS-CoV-2 by FDA under an Emergency Use Authorization (EUA). This EUA will remain  in effect (meaning this test can be used) for the duration of the COVID-19 declaration under Section 564(b)(1) of the Act, 21 U.S.C.section 360bbb-3(b)(1), unless the authorization is terminated  or revoked sooner.       Influenza A by PCR NEGATIVE NEGATIVE Final   Influenza B by PCR NEGATIVE NEGATIVE Final    Comment: (NOTE) The Xpert Xpress SARS-CoV-2/FLU/RSV plus assay is intended as an aid in the diagnosis of influenza from Nasopharyngeal swab specimens and should not be used as a sole basis for treatment. Nasal washings and aspirates are unacceptable for Xpert Xpress SARS-CoV-2/FLU/RSV testing.  Fact Sheet for Patients: BloggerCourse.com  Fact Sheet for Healthcare Providers: SeriousBroker.it  This test is not yet approved or cleared by the [place]  FDA and has been authorized for detection and/or diagnosis of SARS-CoV-2 by FDA under an Emergency Use Authorization (EUA). This EUA will remain in effect (meaning this test can be used) for the duration of the COVID-19 declaration under Section 564(b)(1) of the Act, 21 U.S.C. section 360bbb-3(b)(1),  unless the authorization is terminated or revoked.     Resp Syncytial Virus by PCR NEGATIVE NEGATIVE Final    Comment: (NOTE) Fact Sheet for Patients: BloggerCourse.com  Fact Sheet for Healthcare Providers: SeriousBroker.it  This test is not yet approved or cleared by the [place]  FDA and has been authorized for detection and/or diagnosis of SARS-CoV-2 by FDA under an Emergency Use Authorization (EUA). This EUA will remain in effect (meaning this test can be used) for the duration of the COVID-19 declaration under Section 564(b)(1) of the Act, 21 U.S.C. section 360bbb-3(b)(1), unless the  authorization is terminated or revoked.  Performed at [facility], [address], [place], [place] [address]   Gastrointestinal Panel by PCR , Stool     Status: None   Collection Time: 08/21/23  3:51 AM   Specimen: Stool  Result Value Ref Range Status   Campylobacter species NOT DETECTED NOT DETECTED Final   Plesimonas shigelloides NOT DETECTED NOT DETECTED Final   Salmonella species NOT DETECTED NOT DETECTED Final   Yersinia enterocolitica NOT DETECTED NOT DETECTED Final   Vibrio species NOT DETECTED NOT DETECTED Final   Vibrio cholerae NOT DETECTED NOT DETECTED Final   Enteroaggregative E coli (EAEC) NOT DETECTED NOT DETECTED Final   Enteropathogenic E coli (EPEC) NOT DETECTED NOT DETECTED Final   Enterotoxigenic E coli (ETEC) NOT DETECTED NOT DETECTED Final   Shiga like toxin producing E coli (STEC) NOT DETECTED NOT DETECTED Final   Shigella/Enteroinvasive E coli (EIEC) NOT DETECTED NOT DETECTED Final   Cryptosporidium NOT DETECTED NOT DETECTED Final   Cyclospora cayetanensis NOT DETECTED NOT DETECTED Final   Entamoeba histolytica NOT DETECTED NOT DETECTED Final   Giardia lamblia NOT DETECTED NOT DETECTED Final   Adenovirus F40/41 NOT DETECTED NOT DETECTED Final   Astrovirus NOT DETECTED NOT DETECTED Final    Norovirus GI/GII NOT DETECTED NOT DETECTED Final   Rotavirus A NOT DETECTED NOT DETECTED Final   Sapovirus (I, II, IV, and V) NOT DETECTED NOT DETECTED Final    Comment: Performed at [facility], [address] [place], [place] [address]     Labs:  Basic Metabolic Panel: Recent Labs  Lab 08/23/23 0937 08/23/23 1838 08/23/23 2012 08/24/23 0447 08/25/23 1020 08/26/23 0520  NA 138 139  --  135 136 134*  K 3.3* 3.1*  --  3.3* 3.7 3.3*  CL 101 103  --  103 105 105  CO2 25 24  --  22 23 23   GLUCOSE 101* 92  --  90 94 91  BUN 10 9  --  10 6 8   CREATININE 0.76 0.73  --  0.71 0.68 0.83  CALCIUM 9.2 9.2  --  8.5* 8.8* 8.9  MG  --   --  2.5*  --   --   --    Liver Function Tests: Recent Labs  Lab 08/21/23 0413 08/23/23 0937 08/23/23 1838 08/24/23 0447 08/26/23 0520  AST 28 29 25 17  14*  ALT 38 58* 57* 45* 33  ALKPHOS 47 51 53 46 47  BILITOT 1.2 1.1 1.2 1.1 0.8  PROT 6.8 7.1 7.2 6.3* 6.4*  ALBUMIN 3.9 4.3 4.0 3.7 3.8   Recent Labs  Lab 08/20/23 0950 08/23/23 0937 08/23/23 1838  LIPASE 18 32 34   CBC: Recent Labs  Lab 08/20/23 0950 08/21/23 0413 08/23/23 0937 08/23/23 1838 08/24/23 0041 08/24/23 0447 08/24/23 1320 08/25/23 1020 08/26/23 0520  WBC 14.6*   < > 9.1 9.2  --  10.3  --  9.9 11.9*  NEUTROABS 11.4*  --  6.2 6.2  --   --   --  7.4 7.0  HGB 16.6   < > 14.6 14.4 13.7 13.4 14.3 14.7 14.6  HCT 46.7   < > 42.6 41.3 39.1 38.9* 40.9 44.4 44.0  MCV 89.3   < > 89.7 90.6  --  90.5  --  93.9 92.8  PLT 291   < > 244 261  --  235  --  238 235   < > = values in this interval not displayed.  Urinalysis    Component Value Date/Time   COLORURINE YELLOW 08/23/2023 1059   APPEARANCEUR CLEAR 08/23/2023 1059   LABSPEC 1.019 08/23/2023 1059   PHURINE 7.0 08/23/2023 1059   GLUCOSEU NEGATIVE 08/23/2023 1059   HGBUR NEGATIVE 08/23/2023 1059   BILIRUBINUR NEGATIVE 08/23/2023 1059   KETONESUR NEGATIVE 08/23/2023 1059   PROTEINUR NEGATIVE 08/23/2023 1059    NITRITE NEGATIVE 08/23/2023 1059   LEUKOCYTESUR NEGATIVE 08/23/2023 1059   Sepsis Labs Recent Labs  Lab 08/23/23 1838 08/24/23 0041 08/24/23 0447 08/25/23 1020 08/26/23 0520  PROCALCITON  --  <0.10  --   --   --   WBC 9.2  --  10.3 9.9 11.9*    Procedures/Studies: CT ABDOMEN PELVIS WO CONTRAST Result Date: 08/23/2023 CLINICAL DATA:  Nausea with nonlocalized abdominal pain. EXAM: CT ABDOMEN AND PELVIS WITHOUT CONTRAST TECHNIQUE: Multidetector CT imaging of the abdomen and pelvis was performed following the standard protocol without IV contrast. RADIATION DOSE REDUCTION: This exam was performed according to the departmental dose-optimization program which includes automated exposure control, adjustment of the mA and/or kV according to patient size and/or use of iterative reconstruction technique. COMPARISON:  08/20/2023 FINDINGS: Lower chest: 2 mm right lower lobe nodule on 44/6 is stable in the interval since the most recent comparison study and also comparing back to an exam from 10/25/2017, consistent with benign etiology. No followup imaging is recommended. Hepatobiliary: No suspicious focal abnormality in the liver on this study without intravenous contrast. Layering high attenuation material in the lumen of the gallbladder may reflect sludge or some vicarious excretion of contrast from previous recent CT imaging. No intrahepatic or extrahepatic biliary dilation. Pancreas: No focal mass lesion. No dilatation of the main duct. No intraparenchymal cyst. No peripancreatic edema. Spleen: No splenomegaly. No suspicious focal mass lesion. Adrenals/Urinary Tract: No adrenal nodule or mass. Kidneys unremarkable. No evidence for hydroureter. The urinary bladder appears normal for the degree of distention. Stomach/Bowel: Stomach is unremarkable. No gastric wall thickening. No evidence of outlet obstruction. Duodenum is normally positioned as is the ligament of Treitz. No small bowel wall thickening. No  small bowel dilatation. The terminal ileum is normal. The appendix is normal. No gross colonic mass. No colonic wall thickening. Vascular/Lymphatic: No abdominal aortic aneurysm. No abdominal aortic atherosclerotic calcification. There is no gastrohepatic or hepatoduodenal ligament lymphadenopathy. No retroperitoneal or mesenteric lymphadenopathy. No pelvic sidewall lymphadenopathy. Reproductive: The prostate gland and seminal vesicles are unremarkable. Other: No intraperitoneal free fluid. Musculoskeletal: No worrisome lytic or sclerotic osseous abnormality. IMPRESSION: 1. No acute findings in the abdomen or pelvis. Specifically, no findings to explain the patient's history of nausea and abdominal pain. 2. Layering high attenuation material in the lumen of the gallbladder may reflect sludge or vicarious excretion of contrast from previous recent CT imaging. Electronically Signed   By: Camellia Candle M.D.   On: 08/23/2023 10:52   CT HEAD WO CONTRAST Result Date: 08/23/2023 EXAM: CT HEAD WITHOUT CONTRAST 08/23/2023 10:21:16 AM TECHNIQUE: CT of the head was performed without the administration of intravenous contrast. Automated exposure control, iterative reconstruction, and/or weight based adjustment of the mA/kV was utilized to reduce the radiation dose to as low as reasonably achievable. COMPARISON: CT head without contrast 04/20/2014. CLINICAL HISTORY: Headache, increasing frequency or severity. Pt presents to the ED for severe headache and nausea that has been present since yesterday when he was discharged from hospital. Pt was recently admitted for gastritis. Pt states that he was told to report to ED if his symptoms do  improve. Pt has not been able sleep or eat because of the nausea and pain. FINDINGS: BRAIN AND VENTRICLES: No acute hemorrhage. Gray-white differentiation is preserved. No hydrocephalus. No extra-axial collection. No mass effect or midline shift. ORBITS: No acute abnormality. SINUSES: No acute  abnormality. SOFT TISSUES AND SKULL: No acute soft tissue abnormality. No skull fracture. IMPRESSION: 1. No acute intracranial abnormality. Electronically signed by: Lonni Necessary MD 08/23/2023 10:42 AM EDT RP Workstation: [id number]   CT ABDOMEN PELVIS W CONTRAST Result Date: 08/20/2023 CLINICAL DATA:  Abdominal pain EXAM: CT ABDOMEN AND PELVIS WITH CONTRAST TECHNIQUE: Multidetector CT imaging of the abdomen and pelvis was performed using the standard protocol following bolus administration of intravenous contrast. RADIATION DOSE REDUCTION: This exam was performed according to the departmental dose-optimization program which includes automated exposure control, adjustment of the mA and/or kV according to patient size and/or use of iterative reconstruction technique. CONTRAST:  OMNIPAQUE  IOHEXOL  300 MG/ML  SOLN COMPARISON:  None Available. FINDINGS: Lower chest: Lung bases are clear. Hepatobiliary: No focal hepatic lesion. Normal gallbladder. No biliary duct dilatation. Common bile duct is normal. Pancreas: Pancreas is normal. No ductal dilatation. No pancreatic inflammation. Spleen: Normal spleen Adrenals/urinary tract: Adrenal glands and kidneys are normal. The ureters and bladder normal. Stomach/Bowel: Stomach, small bowel, appendix, and cecum are normal. The colon and rectosigmoid colon are normal. Vascular/Lymphatic: Abdominal aorta is normal caliber. No periportal or retroperitoneal adenopathy. No pelvic adenopathy. Reproductive: Prostate unremarkable Other: No free fluid. Musculoskeletal: No aggressive osseous lesion. IMPRESSION: 1. No acute findings in the abdomen pelvis. 2. Normal appendix. 3. No bowel obstruction. Electronically Signed   By: Jackquline Boxer M.D.   On: 08/20/2023 10:39   DG Foot Complete Right Result Date: 08/17/2023 Please see detailed radiograph report in office note.   Time coordinating discharge: 55 mins  SIGNED:  Camellia Door, DO [facility] 08/26/23,  12:04 PM

## 2023-08-26 NOTE — Assessment & Plan Note (Signed)
 08-26-2023 EGD shows duodenal ulcer. DC to home with BID protonix  and Carafate  QID. F/u with GI in 6-8 weeks.

## 2023-08-27 LAB — SURGICAL PATHOLOGY

## 2023-08-28 ENCOUNTER — Other Ambulatory Visit (HOSPITAL_BASED_OUTPATIENT_CLINIC_OR_DEPARTMENT_OTHER): Payer: Self-pay

## 2023-08-28 ENCOUNTER — Encounter: Payer: Self-pay | Admitting: Podiatry

## 2023-08-28 NOTE — Telephone Encounter (Signed)
 Emailed pt letter with extension date 09/07/23 and faxed to [facility]. See prev notes

## 2023-08-29 ENCOUNTER — Other Ambulatory Visit (HOSPITAL_COMMUNITY): Payer: Self-pay

## 2023-08-29 ENCOUNTER — Encounter: Payer: Self-pay | Admitting: Podiatry

## 2023-08-29 ENCOUNTER — Encounter (HOSPITAL_COMMUNITY): Payer: Self-pay

## 2023-08-29 ENCOUNTER — Ambulatory Visit
Admission: EM | Admit: 2023-08-29 | Discharge: 2023-08-29 | Disposition: A | Discharge status: Home / Self Care | Attending: Emergency Medicine | Admitting: Emergency Medicine

## 2023-08-29 ENCOUNTER — Emergency Department (HOSPITAL_COMMUNITY)

## 2023-08-29 ENCOUNTER — Observation Stay (HOSPITAL_COMMUNITY)
Admission: EM | Admit: 2023-08-29 | Discharge: 2023-08-30 | Disposition: A | Discharge status: Home / Self Care | Attending: Internal Medicine | Admitting: Internal Medicine

## 2023-08-29 ENCOUNTER — Encounter: Payer: Self-pay | Admitting: Emergency Medicine

## 2023-08-29 ENCOUNTER — Other Ambulatory Visit: Payer: Self-pay

## 2023-08-29 DIAGNOSIS — K269 Duodenal ulcer, unspecified as acute or chronic, without hemorrhage or perforation: Secondary | ICD-10-CM | POA: Diagnosis not present

## 2023-08-29 DIAGNOSIS — K279 Peptic ulcer, site unspecified, unspecified as acute or chronic, without hemorrhage or perforation: Secondary | ICD-10-CM | POA: Diagnosis present

## 2023-08-29 DIAGNOSIS — R109 Unspecified abdominal pain: Secondary | ICD-10-CM | POA: Diagnosis present

## 2023-08-29 DIAGNOSIS — K273 Acute peptic ulcer, site unspecified, without hemorrhage or perforation: Principal | ICD-10-CM | POA: Insufficient documentation

## 2023-08-29 DIAGNOSIS — M79671 Pain in right foot: Secondary | ICD-10-CM

## 2023-08-29 DIAGNOSIS — E876 Hypokalemia: Secondary | ICD-10-CM | POA: Diagnosis present

## 2023-08-29 DIAGNOSIS — T39395A Adverse effect of other nonsteroidal anti-inflammatory drugs [NSAID], initial encounter: Secondary | ICD-10-CM

## 2023-08-29 DIAGNOSIS — R1084 Generalized abdominal pain: Secondary | ICD-10-CM | POA: Diagnosis not present

## 2023-08-29 DIAGNOSIS — R112 Nausea with vomiting, unspecified: Secondary | ICD-10-CM | POA: Diagnosis present

## 2023-08-29 LAB — COMPREHENSIVE METABOLIC PANEL WITH GFR
ALT: 34 U/L (ref 0–44)
AST: 20 U/L (ref 15–41)
Albumin: 4.6 g/dL (ref 3.5–5.0)
Alkaline Phosphatase: 58 U/L (ref 38–126)
Anion gap: 10 (ref 5–15)
BUN: 10 mg/dL (ref 6–20)
CO2: 24 mmol/L (ref 22–32)
Calcium: 9.3 mg/dL (ref 8.9–10.3)
Chloride: 101 mmol/L (ref 98–111)
Creatinine, Ser: 0.68 mg/dL (ref 0.61–1.24)
GFR, Estimated: >60 mL/min (ref >60)
Glucose, Bld: 94 mg/dL (ref 70–99)
Potassium: 3.1 mmol/L — ABNORMAL LOW (ref 3.5–5.1)
Sodium: 135 mmol/L (ref 135–145)
Total Bilirubin: 1.2 mg/dL (ref 0.0–1.2)
Total Protein: 7.7 g/dL (ref 6.5–8.1)

## 2023-08-29 LAB — CBC
HCT: 45.5 % (ref 39.0–52.0)
Hemoglobin: 15.3 g/dL (ref 13.0–17.0)
MCH: 30.8 pg (ref 26.0–34.0)
MCHC: 33.6 g/dL (ref 30.0–36.0)
MCV: 91.5 fL (ref 80.0–100.0)
Platelets: 290 K/uL (ref 150–400)
RBC: 4.97 MIL/uL (ref 4.22–5.81)
RDW: 12.9 % (ref 11.5–15.5)
WBC: 8.8 K/uL (ref 4.0–10.5)
nRBC: 0 % (ref 0.0–0.2)

## 2023-08-29 LAB — URINALYSIS, ROUTINE W REFLEX MICROSCOPIC
Bilirubin Urine: NEGATIVE
Glucose, UA: NEGATIVE mg/dL
Hgb urine dipstick: NEGATIVE
Ketones, ur: 20 mg/dL — AB
Leukocytes,Ua: NEGATIVE
Nitrite: NEGATIVE
Protein, ur: NEGATIVE mg/dL
Specific Gravity, Urine: 1.005 (ref 1.005–1.030)
pH: 6 (ref 5.0–8.0)

## 2023-08-29 LAB — LIPASE, BLOOD: Lipase: 30 U/L (ref 11–51)

## 2023-08-29 MED ORDER — POTASSIUM CHLORIDE CRYS ER 20 MEQ PO TBCR
40.0000 meq | EXTENDED_RELEASE_TABLET | Freq: Once | ORAL | Status: AC
  Administered 2023-08-29: 40 meq via ORAL
  Filled 2023-08-29: qty 2

## 2023-08-29 MED ORDER — ONDANSETRON 4 MG PO TBDP: 4.0000 mg | ORAL_TABLET | Freq: Three times a day (TID) | ORAL | 0 refills | Status: DC | PRN

## 2023-08-29 MED ORDER — FENTANYL CITRATE PF 50 MCG/ML IJ SOSY: 100.0000 ug | PREFILLED_SYRINGE | Freq: Once | INTRAMUSCULAR | Status: DC

## 2023-08-29 MED ORDER — ALUM & MAG HYDROXIDE-SIMETH 200-200-20 MG/5ML PO SUSP
30.0000 mL | Freq: Once | ORAL | Status: AC
  Administered 2023-08-29: 30 mL via ORAL
  Filled 2023-08-29: qty 30

## 2023-08-29 MED ORDER — HYDROMORPHONE HCL 1 MG/ML IJ SOLN
1.0000 mg | Freq: Once | INTRAMUSCULAR | Status: AC
  Administered 2023-08-29: 1 mg via INTRAVENOUS
  Filled 2023-08-29: qty 1

## 2023-08-29 MED ORDER — ONDANSETRON HCL 4 MG/2ML IJ SOLN
4.0000 mg | Freq: Once | INTRAMUSCULAR | Status: AC
  Administered 2023-08-29: 4 mg via INTRAVENOUS
  Filled 2023-08-29: qty 2

## 2023-08-29 MED ORDER — SUCRALFATE 1 G PO TABS
1.0000 g | ORAL_TABLET | Freq: Once | ORAL | Status: AC
  Administered 2023-08-29: 1 g via ORAL
  Filled 2023-08-29: qty 1

## 2023-08-29 MED ORDER — IOHEXOL 300 MG/ML  SOLN
100.0000 mL | Freq: Once | INTRAMUSCULAR | Status: AC | PRN
  Administered 2023-08-29: 100 mL via INTRAVENOUS

## 2023-08-29 MED ORDER — POTASSIUM CHLORIDE 10 MEQ/100ML IV SOLN
10.0000 meq | INTRAVENOUS | Status: AC
  Administered 2023-08-30 (×2): 10 meq via INTRAVENOUS
  Filled 2023-08-29 (×2): qty 100

## 2023-08-29 MED ORDER — LIDOCAINE VISCOUS HCL 2 % MT SOLN
15.0000 mL | Freq: Once | OROMUCOSAL | Status: AC
  Administered 2023-08-29: 15 mL via ORAL
  Filled 2023-08-29: qty 15

## 2023-08-29 MED ORDER — METOCLOPRAMIDE HCL 5 MG/ML IJ SOLN
10.0000 mg | Freq: Once | INTRAMUSCULAR | Status: AC
  Administered 2023-08-29: 10 mg via INTRAMUSCULAR

## 2023-08-29 MED ORDER — OXYCODONE HCL 10 MG PO TABA: 1.0000 | ORAL_TABLET | Freq: Four times a day (QID) | ORAL | 0 refills | Status: DC | PRN

## 2023-08-29 MED ORDER — SUCRALFATE 1 G PO TABS
1.0000 g | ORAL_TABLET | Freq: Three times a day (TID) | ORAL | Status: DC
  Administered 2023-08-30 (×2): 1 g via ORAL
  Filled 2023-08-29 (×2): qty 1

## 2023-08-29 MED ORDER — HYDROMORPHONE HCL 1 MG/ML IJ SOLN
0.5000 mg | Freq: Once | INTRAMUSCULAR | Status: AC
  Administered 2023-08-29: 0.5 mg via INTRAVENOUS
  Filled 2023-08-29: qty 1

## 2023-08-29 MED ORDER — FENTANYL CITRATE PF 50 MCG/ML IJ SOSY: 50.0000 ug | PREFILLED_SYRINGE | Freq: Once | INTRAMUSCULAR | Status: DC

## 2023-08-29 MED ORDER — LACTATED RINGERS IV BOLUS
1000.0000 mL | Freq: Once | INTRAVENOUS | Status: AC
  Administered 2023-08-29: 1000 mL via INTRAVENOUS

## 2023-08-29 MED ORDER — OXYCODONE HCL ER 10 MG PO T12A: 10.0000 mg | EXTENDED_RELEASE_TABLET | Freq: Two times a day (BID) | ORAL | 0 refills | Status: DC

## 2023-08-29 NOTE — ED Notes (Signed)
 Gave pt urinal for sample collection

## 2023-08-29 NOTE — H&P (Incomplete)
 [name] FMW:[id number] DOB: 11/07/1990 DOA: 08/29/2023    PCP: Sun, Vyvyan, MD      Patient arrived to ER on 08/29/23 at 1510 Referred by Attending Franklyn Sid SAILOR, MD   Patient coming from:    home Lives  With family     Chief Complaint:   Chief Complaint  Patient presents with  . Abdominal Pain    HPI: [name] is a 33 y.o. male with medical history significant of PUD     Presented with  abd pain Abdominal pain chronic duodenal ulcer He takes Protonix  and Carafate  at home  Last EGD was 3 days ago showed an ulcer  CT abd today non acute  Pain is very severe Up until yesterday was doing well, except not sleeping  Has been taking his Protonix , today was having worse nausea and vomiting Has not tried to use anything for sleep   Denies significant ETOH intake   Does not smoke but vapes     While in ER: Clinical Course as of 08/29/23 2209  Sat Aug 29, 2023  1735 Mild hypokalemia to 3.1 otherwise bloodwork unremarkable [HN]  1808 1. No acute findings in the abdomen/pelvis. No evidence of gastric ulcer. 2. Possible mild hepatic steatosis.  [HN]  1834 Giving sucralfate , GI cocktail, and potassium supplementation [HN]    Clinical Course User Index [HN] Franklyn Sid SAILOR, MD       Lab Orders         Lipase, blood         Comprehensive metabolic panel         CBC         Urinalysis, Routine w reflex microscopic -Urine, Clean Catch      CTabd/pelvis -  non-acute  Possible mild hepatic steatosis.   Following Medications were ordered in ER: Medications  lactated ringers  bolus 1,000 mL (0 mLs Intravenous Stopped 08/29/23 1952)  HYDROmorphone  (DILAUDID ) injection 1 mg (1 mg Intravenous Given 08/29/23 1632)  ondansetron  (ZOFRAN ) injection 4 mg (4 mg Intravenous Given 08/29/23 1633)  iohexol  (OMNIPAQUE ) 300 MG/ML solution 100 mL (100 mLs Intravenous Contrast Given 08/29/23 1724)  sucralfate  (CARAFATE ) tablet 1 g (1 g Oral Given 08/29/23 1953)  alum & mag  hydroxide-simeth (MAALOX/MYLANTA) 200-200-20 MG/5ML suspension 30 mL (30 mLs Oral Given 08/29/23 1954)    And  lidocaine  (XYLOCAINE ) 2 % viscous mouth solution 15 mL (15 mLs Oral Given 08/29/23 1954)  potassium chloride  SA (KLOR-CON  M) CR tablet 40 mEq (40 mEq Oral Given 08/29/23 1953)  HYDROmorphone  (DILAUDID ) injection 0.5 mg (0.5 mg Intravenous Given 08/29/23 2152)       ED Triage Vitals  Encounter Vitals Group     BP 08/29/23 1519 (!) 166/114     Girls Systolic BP Percentile --      Girls Diastolic BP Percentile --      Boys Systolic BP Percentile --      Boys Diastolic BP Percentile --      Pulse Rate 08/29/23 1519 81     Resp 08/29/23 1519 18     Temp 08/29/23 1519 98.5 F (36.9 C)     Temp Source 08/29/23 1519 Oral     SpO2 08/29/23 1519 100 %     Weight 08/29/23 1519 225 lb (102.1 kg)     Height 08/29/23 1519 6' 3 (1.905 m)     Head Circumference --      Peak Flow --      Pain Score  08/29/23 1526 10     Pain Loc --      Pain Education --      Exclude from Growth Chart --   UFJK(75)@     _________________________________________ Significant initial  Findings: Abnormal Labs Reviewed  COMPREHENSIVE METABOLIC PANEL WITH GFR - Abnormal; Notable for the following components:      Result Value   Potassium 3.1 (*)    All other components within normal limits  URINALYSIS, ROUTINE W REFLEX MICROSCOPIC - Abnormal; Notable for the following components:   Ketones, ur 20 (*)    All other components within normal limits        ECG: Ordered   BNP (last 3 results) No results for input(s): BNP in the last 8760 hours.    No results for input(s): DDIMER, FERRITIN, LDH, CRP in the last 72 hours.    ____________________ This patient meets SIRS Criteria and may be septic. SIRS = Systemic Inflammatory Response Syndrome  Order a lactic acid level if needed AND/OR Initiate the sepsis protocol with the attached order set OR Click Treating Associated Infection or Illness if  the patient is being treated for an infection that is a known cause of these abnormalities     The recent clinical data is shown below. Vitals:   08/29/23 1700 08/29/23 1715 08/29/23 1917 08/29/23 2000  BP: (!) 142/80 (!) 145/73  (!) 160/94  Pulse: 71 63  65  Resp: (!) 21 (!) 25  19  Temp:   98.5 F (36.9 C)   TempSrc:   Oral   SpO2: 96% 98%  99%  Weight:      Height:          WBC     Component Value Date/Time   WBC 8.8 08/29/2023 1527   LYMPHSABS 3.8 08/26/2023 0520   MONOABS 1.0 08/26/2023 0520   EOSABS 0.1 08/26/2023 0520   BASOSABS 0.0 08/26/2023 0520        Lactic Acid, Venous    Component Value Date/Time   LATICACIDVEN 0.7 08/24/2023 0329     Lactic Acid, Venous    Component Value Date/Time   LATICACIDVEN 0.7 08/24/2023 0329    Procalcitonin *** Ordered      UA *** no evidence of UTI  ***Pending ***not ordered   Urine analysis:    Component Value Date/Time   COLORURINE YELLOW 08/29/2023 1855   APPEARANCEUR CLEAR 08/29/2023 1855   LABSPEC 1.005 08/29/2023 1855   PHURINE 6.0 08/29/2023 1855   GLUCOSEU NEGATIVE 08/29/2023 1855   HGBUR NEGATIVE 08/29/2023 1855   BILIRUBINUR NEGATIVE 08/29/2023 1855   KETONESUR 20 (A) 08/29/2023 1855   PROTEINUR NEGATIVE 08/29/2023 1855   NITRITE NEGATIVE 08/29/2023 1855   LEUKOCYTESUR NEGATIVE 08/29/2023 1855    Results for orders placed or performed during the hospital encounter of 08/20/23  Gastrointestinal Panel by PCR , Stool     Status: None   Collection Time: 08/21/23  3:51 AM   Specimen: Stool  Result Value Ref Range Status   Campylobacter species NOT DETECTED NOT DETECTED Final   Plesimonas shigelloides NOT DETECTED NOT DETECTED Final   Salmonella species NOT DETECTED NOT DETECTED Final   Yersinia enterocolitica NOT DETECTED NOT DETECTED Final   Vibrio species NOT DETECTED NOT DETECTED Final   Vibrio cholerae NOT DETECTED NOT DETECTED Final   Enteroaggregative E coli (EAEC) NOT DETECTED NOT DETECTED  Final   Enteropathogenic E coli (EPEC) NOT DETECTED NOT DETECTED Final   Enterotoxigenic E coli (ETEC) NOT DETECTED NOT  DETECTED Final   Shiga like toxin producing E coli (STEC) NOT DETECTED NOT DETECTED Final   Shigella/Enteroinvasive E coli (EIEC) NOT DETECTED NOT DETECTED Final   Cryptosporidium NOT DETECTED NOT DETECTED Final   Cyclospora cayetanensis NOT DETECTED NOT DETECTED Final   Entamoeba histolytica NOT DETECTED NOT DETECTED Final   Giardia lamblia NOT DETECTED NOT DETECTED Final   Adenovirus F40/41 NOT DETECTED NOT DETECTED Final   Astrovirus NOT DETECTED NOT DETECTED Final   Norovirus GI/GII NOT DETECTED NOT DETECTED Final   Rotavirus A NOT DETECTED NOT DETECTED Final   Sapovirus (I, II, IV, and V) NOT DETECTED NOT DETECTED Final    Comment: Performed at [facility], [address] [place], [place] [address]    ABX started Antibiotics Given (last 72 hours)     None        No results found for the last 90 days.    ________________________________________________________________  Arterial ***Venous  Blood Gas result:  pH *** pCO2 ***; pO2 ***;     %O2 Sat ***.  ABG No results found for: PHART, PCO2ART, PO2ART, HCO3, TCO2, ACIDBASEDEF, O2SAT     __________________________________________________________ Recent Labs  Lab 08/23/23 1838 08/23/23 2012 08/24/23 0447 08/25/23 1020 08/26/23 0520 08/29/23 1527  NA 139  --  135 136 134* 135  K 3.1*  --  3.3* 3.7 3.3* 3.1*  CO2 24  --  22 23 23 24   GLUCOSE 92  --  90 94 91 94  BUN 9  --  10 6 8 10   CREATININE 0.73  --  0.71 0.68 0.83 0.68  CALCIUM 9.2  --  8.5* 8.8* 8.9 9.3  MG  --  2.5*  --   --   --   --     Cr  * stable,  Up from baseline see below Lab Results  Component Value Date   CREATININE 0.68 08/29/2023   CREATININE 0.83 08/26/2023   CREATININE 0.68 08/25/2023    Recent Labs  Lab 08/23/23 0937 08/23/23 1838 08/24/23 0447 08/26/23 0520 08/29/23 1527  AST 29 25 17   14* 20  ALT 58* 57* 45* 33 34  ALKPHOS 51 53 46 47 58  BILITOT 1.1 1.2 1.1 0.8 1.2  PROT 7.1 7.2 6.3* 6.4* 7.7  ALBUMIN 4.3 4.0 3.7 3.8 4.6   Lab Results  Component Value Date   CALCIUM 9.3 08/29/2023       Plt: Lab Results  Component Value Date   PLT 290 08/29/2023        Recent Labs  Lab 08/23/23 0937 08/23/23 1838 08/24/23 0041 08/24/23 0447 08/24/23 1320 08/25/23 1020 08/26/23 0520 08/29/23 1527  WBC 9.1 9.2  --  10.3  --  9.9 11.9* 8.8  NEUTROABS 6.2 6.2  --   --   --  7.4 7.0  --   HGB 14.6 14.4   < > 13.4 14.3 14.7 14.6 15.3  HCT 42.6 41.3   < > 38.9* 40.9 44.4 44.0 45.5  MCV 89.7 90.6  --  90.5  --  93.9 92.8 91.5  PLT 244 261  --  235  --  238 235 290   < > = values in this interval not displayed.    HG/HCT * stable,  Down *Up from baseline see below    Component Value Date/Time   HGB 15.3 08/29/2023 1527   HGB 16.2 08/17/2018 1224   HCT 45.5 08/29/2023 1527   HCT 47.2 08/17/2018 1224   MCV 91.5 08/29/2023  1527   MCV 92 08/17/2018 1224      Recent Labs  Lab 08/23/23 0937 08/23/23 1838 08/29/23 1527  LIPASE 32 34 30   No results for input(s): AMMONIA in the last 168 hours.    _______________________________________________ Hospitalist was called for admission for *** Generalized abdominal pain ***  Nausea and vomiting, unspecified vomiting type ***  Duodenal ulcer ***    The following Work up has been ordered so far:  Orders Placed This Encounter  Procedures  . CT ABDOMEN PELVIS W CONTRAST  . Lipase, blood  . Comprehensive metabolic panel  . CBC  . Urinalysis, Routine w reflex microscopic -Urine, Clean Catch  . Diet NPO time specified  . ED Cardiac monitoring  . Consult to hospitalist  . Pulse oximetry, continuous     OTHER Significant initial  Findings:  labs showing:     DM  labs:  HbA1C: No results for input(s): HGBA1C in the last 8760 hours.     CBG (last 3)  No results for input(s): GLUCAP in the last  72 hours.        Cultures: No results found for: SDES, SPECREQUEST, CULT, REPTSTATUS   Radiological Exams on Admission: CT ABDOMEN PELVIS W CONTRAST Result Date: 08/29/2023 CLINICAL DATA:  Abdominal pain, evaluate for perforated ulcer. Patient reports abdominal pain and bleeding ulcer 2 weeks. Nausea vomiting and diarrhea. EXAM: CT ABDOMEN AND PELVIS WITH CONTRAST TECHNIQUE: Multidetector CT imaging of the abdomen and pelvis was performed using the standard protocol following bolus administration of intravenous contrast. RADIATION DOSE REDUCTION: This exam was performed according to the departmental dose-optimization program which includes automated exposure control, adjustment of the mA and/or kV according to patient size and/or use of iterative reconstruction technique. CONTRAST:  OMNIPAQUE  IOHEXOL  300 MG/ML  SOLN COMPARISON:  08/23/2023 FINDINGS: Lower chest: Heart is normal size.  Visualized lung bases are clear. Hepatobiliary: There is mild diffuse low-attenuation of the liver which may represent a degree of steatosis as there is no focal liver mass. Gallbladder and biliary tree are normal. Pancreas: Normal. Spleen: Normal. Adrenals/Urinary Tract: Adrenal glands are normal. Kidneys are normal in size without hydronephrosis or nephrolithiasis. Ureters and bladder are normal. Stomach/Bowel: Stomach and small bowel are unremarkable. Appendix is normal. Colon is normal. Vascular/Lymphatic: Abdominal aorta is normal in caliber. Remaining vascular structures are unremarkable. No adenopathy. Reproductive: Normal. Other: No free fluid or focal inflammatory change. Musculoskeletal: No focal abnormality. IMPRESSION: 1. No acute findings in the abdomen/pelvis. No evidence of gastric ulcer. 2. Possible mild hepatic steatosis. Electronically Signed   By: Toribio Agreste M.D.   On: 08/29/2023 17:46    _______________________________________________________________________________________________________ Latest  Blood pressure (!) 160/94, pulse 65, temperature 98.5 F (36.9 C), temperature source Oral, resp. rate 19, height 6' 3 (1.905 m), weight 102.1 kg, SpO2 99%.   Vitals  labs and radiology finding personally reviewed  Review of Systems:    Pertinent positives include: ***  Constitutional:  No weight loss, night sweats, Fevers, chills, fatigue, weight loss  HEENT:  No headaches, Difficulty swallowing,Tooth/dental problems,Sore throat,  No sneezing, itching, ear ache, nasal congestion, post nasal drip,  Cardio-vascular:  No chest pain, Orthopnea, PND, anasarca, dizziness, palpitations.no Bilateral lower extremity swelling  GI:  No heartburn, indigestion, abdominal pain, nausea, vomiting, diarrhea, change in bowel habits, loss of appetite, melena, blood in stool, hematemesis Resp:  no shortness of breath at rest. No dyspnea on exertion, No excess mucus, no productive cough, No non-productive cough, No coughing up of  blood.No change in color of mucus.No wheezing. Skin:  no rash or lesions. No jaundice GU:  no dysuria, change in color of urine, no urgency or frequency. No straining to urinate.  No flank pain.  Musculoskeletal:  No joint pain or no joint swelling. No decreased range of motion. No back pain.  Psych:  No change in mood or affect. No depression or anxiety. No memory loss.  Neuro: no localizing neurological complaints, no tingling, no weakness, no double vision, no gait abnormality, no slurred speech, no confusion  All systems reviewed and apart from HOPI all are negative _______________________________________________________________________________________________ Past Medical History:   Past Medical History:  Diagnosis Date  . ADHD   . Whooping cough       Past Surgical History:  Procedure Laterality Date  . ESOPHAGOGASTRODUODENOSCOPY N/A 08/25/2023    Procedure: EGD (ESOPHAGOGASTRODUODENOSCOPY);  Surgeon: Kriss Estefana DEL, DO;  Location: [facility];  Service: Gastroenterology;  Laterality: N/A;  . FOOT SURGERY  06/2023    Social History:  Ambulatory *** independently cane, walker  wheelchair bound, bed bound     reports that he has been smoking e-cigarettes. He has never used smokeless tobacco. He reports current alcohol use. He reports that he does not use drugs.     Family History: *** Family History  Problem Relation Age of Onset  . Hypertension Mother    ______________________________________________________________________________________________ Allergies: Allergies  Allergen Reactions  . Ibuprofen  Other (See Comments)    Caused ulcers     Prior to Admission medications   Medication Sig Start Date End Date Taking? Authorizing Provider  amphetamine-dextroamphetamine (ADDERALL) 30 MG tablet Take 30 mg by mouth 2 (two) times daily. 07/20/23  Yes [provider]  ondansetron  (ZOFRAN ) 4 MG tablet Take 1 tablet (4 mg total) by mouth every 6 (six) hours as needed for nausea or vomiting. 08/26/23 09/25/23 Yes Laurence Locus, DO  ondansetron  (ZOFRAN -ODT) 4 MG disintegrating tablet Take 1 tablet (4 mg total) by mouth every 8 (eight) hours as needed for nausea or vomiting. Patient taking differently: Take 4 mg by mouth every 8 (eight) hours as needed for nausea or vomiting (dissolve orally). 08/29/23  Yes Garrison, [place]  N, FNP  Oxycodone  HCl 10 MG TABS Take 10 mg by mouth every 6 (six) hours as needed (for pain).   Yes [provider]  pantoprazole  (PROTONIX ) 40 MG tablet Take 1 tablet (40 mg total) by mouth 2 (two) times daily. 08/26/23 11/24/23 Yes Laurence Locus, DO  promethazine  (PHENERGAN ) 25 MG tablet Take 1 tablet (25 mg total) by mouth every 6 (six) hours as needed for nausea or vomiting. 08/26/23  Yes Laurence Locus, DO  sucralfate  (CARAFATE ) 1 g tablet Take 1 tablet (1 g total) by mouth 4 (four) times daily -  with  meals and at bedtime. 08/26/23 11/24/23 Yes Laurence Locus, DO  oxyCODONE  (OXYCONTIN ) 10 mg 12 hr tablet Take 1 tablet (10 mg total) by mouth every 12 (twelve) hours for 3 days. Patient not taking: Reported on 08/29/2023 08/29/23 09/01/23  Dreama [place]  N, FNP    ___________________________________________________________________________________________________ Physical Exam:    08/29/2023    8:00 PM 08/29/2023    5:15 PM 08/29/2023    5:00 PM  Vitals with BMI  Systolic 160 145 857  Diastolic 94 73 80  Pulse 65 63 71     1. General:  in No ***Acute distress***increased work of breathing ***complaining of severe pain****agitated * Chronically ill *well *cachectic *toxic acutely ill -appearing 2. Psychological: Alert and ***  Oriented 3. Head/ENT:   Moist *** Dry Mucous Membranes                          Head Non traumatic, neck supple                          Normal *** Poor Dentition 4. SKIN: normal *** decreased Skin turgor,  Skin clean Dry and intact no rash    5. Heart: Regular rate and rhythm no*** Murmur, no Rub or gallop 6. Lungs: ***Clear to auscultation bilaterally, no wheezes or crackles   7. Abdomen: Soft, ***non-tender, Non distended *** obese ***bowel sounds present 8. Lower extremities: no clubbing, cyanosis, no ***edema 9. Neurologically Grossly intact, moving all 4 extremities equally *** strength 5 out of 5 in all 4 extremities cranial nerves II through XII intact 10. MSK: Normal range of motion    Chart has been reviewed  ______________________________________________________________________________________________  Assessment/Plan  ***  Admitted for *** Generalized abdominal pain ***  Nausea and vomiting, unspecified vomiting type ***  Duodenal ulcer ***    Present on Admission: **None**     No problem-specific Assessment & Plan notes found for this encounter.    Other plan as per orders.  DVT prophylaxis:  SCD *** Lovenox        Code Status:     Code Status: Prior FULL CODE *** DNR/DNI ***comfort care as per patient ***family  I had personally discussed CODE STATUS with patient and family*  ACP *** none has been reviewed ***   Family Communication:   Family not at  Bedside  plan of care was discussed on the phone with *** Son, Daughter, Wife, Husband, Sister, Brother , father, mother  Diet  Diet Orders (From admission, onward)     Start     Ordered   08/29/23 1527  Diet NPO time specified  Diet effective now        08/29/23 1526            Disposition Plan:   *** likely will need placement for rehabilitation                          Back to current facility when stable                            To home once workup is complete and patient is stable  ***Following barriers for discharge:                             Chest pain *** Stroke *** Syncope ***work up is complete                            Electrolytes corrected                               Anemia corrected h/H stable                             Pain controlled with PO medications  Afebrile, white count improving able to transition to PO antibiotics                             Will need to be able to tolerate PO                            Will likely need home health, home O2, set up                           Will need consultants to evaluate patient prior to discharge                           Work of breathing improves       Consult Orders  (From admission, onward)           Start     Ordered   08/29/23 2143  Consult to hospitalist  Once       Provider:  (Not yet assigned)  Question Answer Comment  Place call to: [facility]   Reason for Consult Admit      08/29/23 2142                              ***Would benefit from PT/OT eval prior to DC  Ordered                   Swallow eval - SLP ordered                   Diabetes care coordinator                   Transition of care consulted                    Nutrition    consulted                  Wound care  consulted                   Palliative care    consulted                   Behavioral health  consulted                    Consults called: ***  NONE   Admission status:  ED Disposition     ED Disposition  Admit   Condition  --   Comment  The patient appears reasonably stabilized for admission considering the current resources, flow, and capabilities available in the ED at this time, and I doubt any other [facility] requiring further screening and/or treatment in the ED prior to admission is  present.           Obs***  ***  inpatient     I Expect 2 midnight stay secondary to severity of patient's current illness need for inpatient interventions justified by the following: ***hemodynamic instability despite optimal treatment (tachycardia *hypotension * tachypnea *hypoxia, hypercapnia) *** Severe lab/radiological/exam abnormalities including:    Generalized abdominal pain ***  Nausea and vomiting, unspecified vomiting type ***  Duodenal ulcer ***  and extensive comorbidities including: *substance abuse  *Chronic pain *DM2  * CHF * CAD  * COPD/asthma *Morbid Obesity *  CKD *dementia *liver disease *history of stroke with residual deficits *  malignancy, * sickle cell disease  History of amputation Chronic anticoagulation  That are currently affecting medical management.   I expect  patient to be hospitalized for 2 midnights requiring inpatient medical care.  Patient is at high risk for adverse outcome (such as loss of life or disability) if not treated.  Indication for inpatient stay as follows:  Severe change from baseline regarding mental status Hemodynamic instability despite maximal medical therapy,  severe pain requiring acute inpatient management,  inability to maintain oral hydration   persistent chest pain despite medical management Need for operative/procedural  intervention New or worsening  hypoxia ongoing suicidal ideations   Need for IV antibiotics, IV fluids,, IV pain medications, IV anticoagulation,  IV rate controling medications, IV antihypertensives need for biPAP Frequent labs    Level of care   *** tele  For 12H 24H     medical floor       progressive     stepdown   tele indefinitely please discontinue once patient no longer qualifies COVID-19 Labs    Critical***  Patient is critically ill due to  hemodynamic instability * respiratory failure *severe sepsis* ongoing chest pain*  They are at high risk for life/limb threatening clinical deterioration requiring frequent reassessment and modifications of care.  Services provided include examination of the patient, review of relevant ancillary tests, prescription of lifesaving therapies, review of medications and prophylactic therapy.  Total critical care time excluding separately billable procedures: 60*  Minutes.    Jashae Wiggs 08/29/2023, 10:09 PM ***  [facility]     after 2 AM please page floor coverage   If 7AM-7PM, please contact the day team taking care of the patient using [email or web address]

## 2023-08-29 NOTE — H&P (Signed)
 [name] FMW:[id number] DOB: November 23, 1990 DOA: 08/29/2023     PCP: Sun, Vyvyan, MD      Patient arrived to ER on 08/29/23 at 1510 Referred by Attending Franklyn Sid SAILOR, MD   Patient coming from:    home Lives  With family     Chief Complaint:   Chief Complaint  Patient presents with   Abdominal Pain    HPI: [name] is a 33 y.o. male with medical history significant of PUD     Presented with  abd pain Abdominal pain chronic duodenal ulcer He takes Protonix  and Carafate  at home  Last EGD was 3 days ago showed an ulcer  CT abd today non acute  Pain is very severe Up until yesterday was doing well, except not sleeping  Has been taking his Protonix , today was having worse nausea and vomiting Has not tried to use anything for sleep   Denies significant ETOH intake   Does not smoke but vapes     While in ER: Clinical Course as of 08/29/23 2209  Sat Aug 29, 2023  1735 Mild hypokalemia to 3.1 otherwise bloodwork unremarkable [HN]  1808 1. No acute findings in the abdomen/pelvis. No evidence of gastric ulcer. 2. Possible mild hepatic steatosis.  [HN]  1834 Giving sucralfate , GI cocktail, and potassium supplementation [HN]    Clinical Course User Index [HN] Franklyn Sid SAILOR, MD       Lab Orders         Lipase, blood         Comprehensive metabolic panel         CBC         Urinalysis, Routine w reflex microscopic -Urine, Clean Catch      CT HEAD *** NON acute   MRI brain  ***no acute CVA  CXR - ***NON acute  CTabd/pelvis - ***nonacute  CTA chest - ***nonacute, no PE, * no evidence of infiltrate  Following Medications were ordered in ER: Medications  lactated ringers  bolus 1,000 mL (0 mLs Intravenous Stopped 08/29/23 1952)  HYDROmorphone  (DILAUDID ) injection 1 mg (1 mg Intravenous Given 08/29/23 1632)  ondansetron  (ZOFRAN ) injection 4 mg (4 mg Intravenous Given 08/29/23 1633)  iohexol  (OMNIPAQUE ) 300 MG/ML solution 100 mL (100 mLs  Intravenous Contrast Given 08/29/23 1724)  sucralfate  (CARAFATE ) tablet 1 g (1 g Oral Given 08/29/23 1953)  alum & mag hydroxide-simeth (MAALOX/MYLANTA) 200-200-20 MG/5ML suspension 30 mL (30 mLs Oral Given 08/29/23 1954)    And  lidocaine  (XYLOCAINE ) 2 % viscous mouth solution 15 mL (15 mLs Oral Given 08/29/23 1954)  potassium chloride  SA (KLOR-CON  M) CR tablet 40 mEq (40 mEq Oral Given 08/29/23 1953)  HYDROmorphone  (DILAUDID ) injection 0.5 mg (0.5 mg Intravenous Given 08/29/23 2152)    _______________________________________________________ ER Provider Called:       DrGOMEZ  They Recommend admit to medicine *** Will see in AM  ***SEEN in ER     ED Triage Vitals  Encounter Vitals Group     BP 08/29/23 1519 (!) 166/114     Girls Systolic BP Percentile --      Girls Diastolic BP Percentile --      Boys Systolic BP Percentile --      Boys Diastolic BP Percentile --      Pulse Rate 08/29/23 1519 81     Resp 08/29/23 1519 18     Temp 08/29/23 1519 98.5 F (36.9 C)     Temp Source 08/29/23 1519 Oral  SpO2 08/29/23 1519 100 %     Weight 08/29/23 1519 225 lb (102.1 kg)     Height 08/29/23 1519 6' 3 (1.905 m)     Head Circumference --      Peak Flow --      Pain Score 08/29/23 1526 10     Pain Loc --      Pain Education --      Exclude from Growth Chart --   UFJK(75)@     _________________________________________ Significant initial  Findings: Abnormal Labs Reviewed  COMPREHENSIVE METABOLIC PANEL WITH GFR - Abnormal; Notable for the following components:      Result Value   Potassium 3.1 (*)    All other components within normal limits  URINALYSIS, ROUTINE W REFLEX MICROSCOPIC - Abnormal; Notable for the following components:   Ketones, ur 20 (*)    All other components within normal limits      _________________________ Troponin ***ordered Cardiac Panel (last 3 results) No results for input(s): CKTOTAL, CKMB, TROPONINIHS, RELINDX in the last 72 hours.   ECG:  Ordered Personally reviewed and interpreted by me showing: HR : *** Rhythm: *NSR, Sinus tachycardia * A.fib. W RVR, RBBB, LBBB, Paced Ischemic changes*nonspecific changes, no evidence of ischemic changes QTC*  BNP (last 3 results) No results for input(s): BNP in the last 8760 hours.    No results for input(s): DDIMER, FERRITIN, LDH, CRP in the last 72 hours.    ____________________ This patient meets SIRS Criteria and may be septic. SIRS = Systemic Inflammatory Response Syndrome  Order a lactic acid level if needed AND/OR Initiate the sepsis protocol with the attached order set OR Click Treating Associated Infection or Illness if the patient is being treated for an infection that is a known cause of these abnormalities     The recent clinical data is shown below. Vitals:   08/29/23 1700 08/29/23 1715 08/29/23 1917 08/29/23 2000  BP: (!) 142/80 (!) 145/73  (!) 160/94  Pulse: 71 63  65  Resp: (!) 21 (!) 25  19  Temp:   98.5 F (36.9 C)   TempSrc:   Oral   SpO2: 96% 98%  99%  Weight:      Height:          WBC     Component Value Date/Time   WBC 8.8 08/29/2023 1527   LYMPHSABS 3.8 08/26/2023 0520   MONOABS 1.0 08/26/2023 0520   EOSABS 0.1 08/26/2023 0520   BASOSABS 0.0 08/26/2023 0520        Lactic Acid, Venous    Component Value Date/Time   LATICACIDVEN 0.7 08/24/2023 0329     Lactic Acid, Venous    Component Value Date/Time   LATICACIDVEN 0.7 08/24/2023 0329    Procalcitonin *** Ordered      UA *** no evidence of UTI  ***Pending ***not ordered   Urine analysis:    Component Value Date/Time   COLORURINE YELLOW 08/29/2023 1855   APPEARANCEUR CLEAR 08/29/2023 1855   LABSPEC 1.005 08/29/2023 1855   PHURINE 6.0 08/29/2023 1855   GLUCOSEU NEGATIVE 08/29/2023 1855   HGBUR NEGATIVE 08/29/2023 1855   BILIRUBINUR NEGATIVE 08/29/2023 1855   KETONESUR 20 (A) 08/29/2023 1855   PROTEINUR NEGATIVE 08/29/2023 1855   NITRITE NEGATIVE 08/29/2023  1855   LEUKOCYTESUR NEGATIVE 08/29/2023 1855    Results for orders placed or performed during the hospital encounter of 08/20/23  Gastrointestinal Panel by PCR , Stool     Status: None   Collection Time:  08/21/23  3:51 AM   Specimen: Stool  Result Value Ref Range Status   Campylobacter species NOT DETECTED NOT DETECTED Final   Plesimonas shigelloides NOT DETECTED NOT DETECTED Final   Salmonella species NOT DETECTED NOT DETECTED Final   Yersinia enterocolitica NOT DETECTED NOT DETECTED Final   Vibrio species NOT DETECTED NOT DETECTED Final   Vibrio cholerae NOT DETECTED NOT DETECTED Final   Enteroaggregative E coli (EAEC) NOT DETECTED NOT DETECTED Final   Enteropathogenic E coli (EPEC) NOT DETECTED NOT DETECTED Final   Enterotoxigenic E coli (ETEC) NOT DETECTED NOT DETECTED Final   Shiga like toxin producing E coli (STEC) NOT DETECTED NOT DETECTED Final   Shigella/Enteroinvasive E coli (EIEC) NOT DETECTED NOT DETECTED Final   Cryptosporidium NOT DETECTED NOT DETECTED Final   Cyclospora cayetanensis NOT DETECTED NOT DETECTED Final   Entamoeba histolytica NOT DETECTED NOT DETECTED Final   Giardia lamblia NOT DETECTED NOT DETECTED Final   Adenovirus F40/41 NOT DETECTED NOT DETECTED Final   Astrovirus NOT DETECTED NOT DETECTED Final   Norovirus GI/GII NOT DETECTED NOT DETECTED Final   Rotavirus A NOT DETECTED NOT DETECTED Final   Sapovirus (I, II, IV, and V) NOT DETECTED NOT DETECTED Final    Comment: Performed at [facility], [address] [place], [place] [address]    ABX started Antibiotics Given (last 72 hours)     None        No results found for the last 90 days.    ________________________________________________________________  Arterial ***Venous  Blood Gas result:  pH *** pCO2 ***; pO2 ***;     %O2 Sat ***.  ABG No results found for: PHART, PCO2ART, PO2ART, HCO3, TCO2, ACIDBASEDEF, O2SAT      __________________________________________________________ Recent Labs  Lab 08/23/23 1838 08/23/23 2012 08/24/23 0447 08/25/23 1020 08/26/23 0520 08/29/23 1527  NA 139  --  135 136 134* 135  K 3.1*  --  3.3* 3.7 3.3* 3.1*  CO2 24  --  22 23 23 24   GLUCOSE 92  --  90 94 91 94  BUN 9  --  10 6 8 10   CREATININE 0.73  --  0.71 0.68 0.83 0.68  CALCIUM 9.2  --  8.5* 8.8* 8.9 9.3  MG  --  2.5*  --   --   --   --     Cr  * stable,  Up from baseline see below Lab Results  Component Value Date   CREATININE 0.68 08/29/2023   CREATININE 0.83 08/26/2023   CREATININE 0.68 08/25/2023    Recent Labs  Lab 08/23/23 0937 08/23/23 1838 08/24/23 0447 08/26/23 0520 08/29/23 1527  AST 29 25 17  14* 20  ALT 58* 57* 45* 33 34  ALKPHOS 51 53 46 47 58  BILITOT 1.1 1.2 1.1 0.8 1.2  PROT 7.1 7.2 6.3* 6.4* 7.7  ALBUMIN 4.3 4.0 3.7 3.8 4.6   Lab Results  Component Value Date   CALCIUM 9.3 08/29/2023       Plt: Lab Results  Component Value Date   PLT 290 08/29/2023        Recent Labs  Lab 08/23/23 0937 08/23/23 1838 08/24/23 0041 08/24/23 0447 08/24/23 1320 08/25/23 1020 08/26/23 0520 08/29/23 1527  WBC 9.1 9.2  --  10.3  --  9.9 11.9* 8.8  NEUTROABS 6.2 6.2  --   --   --  7.4 7.0  --   HGB 14.6 14.4   < > 13.4 14.3 14.7 14.6 15.3  HCT 42.6 41.3   < > 38.9* 40.9 44.4 44.0 45.5  MCV 89.7 90.6  --  90.5  --  93.9 92.8 91.5  PLT 244 261  --  235  --  238 235 290   < > = values in this interval not displayed.    HG/HCT * stable,  Down *Up from baseline see below    Component Value Date/Time   HGB 15.3 08/29/2023 1527   HGB 16.2 08/17/2018 1224   HCT 45.5 08/29/2023 1527   HCT 47.2 08/17/2018 1224   MCV 91.5 08/29/2023 1527   MCV 92 08/17/2018 1224      Recent Labs  Lab 08/23/23 0937 08/23/23 1838 08/29/23 1527  LIPASE 32 34 30   No results for input(s): AMMONIA in the last 168 hours.    _______________________________________________ Hospitalist was  called for admission for *** Generalized abdominal pain ***  Nausea and vomiting, unspecified vomiting type ***  Duodenal ulcer ***    The following Work up has been ordered so far:  Orders Placed This Encounter  Procedures   CT ABDOMEN PELVIS W CONTRAST   Lipase, blood   Comprehensive metabolic panel   CBC   Urinalysis, Routine w reflex microscopic -Urine, Clean Catch   Diet NPO time specified   ED Cardiac monitoring   Consult to hospitalist   Pulse oximetry, continuous     OTHER Significant initial  Findings:  labs showing:     DM  labs:  HbA1C: No results for input(s): HGBA1C in the last 8760 hours.     CBG (last 3)  No results for input(s): GLUCAP in the last 72 hours.        Cultures: No results found for: SDES, SPECREQUEST, CULT, REPTSTATUS   Radiological Exams on Admission: CT ABDOMEN PELVIS W CONTRAST Result Date: 08/29/2023 CLINICAL DATA:  Abdominal pain, evaluate for perforated ulcer. Patient reports abdominal pain and bleeding ulcer 2 weeks. Nausea vomiting and diarrhea. EXAM: CT ABDOMEN AND PELVIS WITH CONTRAST TECHNIQUE: Multidetector CT imaging of the abdomen and pelvis was performed using the standard protocol following bolus administration of intravenous contrast. RADIATION DOSE REDUCTION: This exam was performed according to the departmental dose-optimization program which includes automated exposure control, adjustment of the mA and/or kV according to patient size and/or use of iterative reconstruction technique. CONTRAST:  OMNIPAQUE  IOHEXOL  300 MG/ML  SOLN COMPARISON:  08/23/2023 FINDINGS: Lower chest: Heart is normal size.  Visualized lung bases are clear. Hepatobiliary: There is mild diffuse low-attenuation of the liver which may represent a degree of steatosis as there is no focal liver mass. Gallbladder and biliary tree are normal. Pancreas: Normal. Spleen: Normal. Adrenals/Urinary Tract: Adrenal glands are normal. Kidneys are  normal in size without hydronephrosis or nephrolithiasis. Ureters and bladder are normal. Stomach/Bowel: Stomach and small bowel are unremarkable. Appendix is normal. Colon is normal. Vascular/Lymphatic: Abdominal aorta is normal in caliber. Remaining vascular structures are unremarkable. No adenopathy. Reproductive: Normal. Other: No free fluid or focal inflammatory change. Musculoskeletal: No focal abnormality. IMPRESSION: 1. No acute findings in the abdomen/pelvis. No evidence of gastric ulcer. 2. Possible mild hepatic steatosis. Electronically Signed   By: Toribio Agreste M.D.   On: 08/29/2023 17:46   _______________________________________________________________________________________________________ Latest  Blood pressure (!) 160/94, pulse 65, temperature 98.5 F (36.9 C), temperature source Oral, resp. rate 19, height 6' 3 (1.905 m), weight 102.1 kg, SpO2 99%.   Vitals  labs and radiology finding personally reviewed  Review of Systems:    Pertinent  positives include: ***  Constitutional:  No weight loss, night sweats, Fevers, chills, fatigue, weight loss  HEENT:  No headaches, Difficulty swallowing,Tooth/dental problems,Sore throat,  No sneezing, itching, ear ache, nasal congestion, post nasal drip,  Cardio-vascular:  No chest pain, Orthopnea, PND, anasarca, dizziness, palpitations.no Bilateral lower extremity swelling  GI:  No heartburn, indigestion, abdominal pain, nausea, vomiting, diarrhea, change in bowel habits, loss of appetite, melena, blood in stool, hematemesis Resp:  no shortness of breath at rest. No dyspnea on exertion, No excess mucus, no productive cough, No non-productive cough, No coughing up of blood.No change in color of mucus.No wheezing. Skin:  no rash or lesions. No jaundice GU:  no dysuria, change in color of urine, no urgency or frequency. No straining to urinate.  No flank pain.  Musculoskeletal:  No joint pain or no joint swelling. No decreased range of  motion. No back pain.  Psych:  No change in mood or affect. No depression or anxiety. No memory loss.  Neuro: no localizing neurological complaints, no tingling, no weakness, no double vision, no gait abnormality, no slurred speech, no confusion  All systems reviewed and apart from HOPI all are negative _______________________________________________________________________________________________ Past Medical History:   Past Medical History:  Diagnosis Date   ADHD    Whooping cough       Past Surgical History:  Procedure Laterality Date   ESOPHAGOGASTRODUODENOSCOPY N/A 08/25/2023   Procedure: EGD (ESOPHAGOGASTRODUODENOSCOPY);  Surgeon: Kriss Estefana DEL, DO;  Location: [facility];  Service: Gastroenterology;  Laterality: N/A;   FOOT SURGERY  06/2023    Social History:  Ambulatory *** independently cane, walker  wheelchair bound, bed bound     reports that he has been smoking e-cigarettes. He has never used smokeless tobacco. He reports current alcohol use. He reports that he does not use drugs.     Family History: *** Family History  Problem Relation Age of Onset   Hypertension Mother    ______________________________________________________________________________________________ Allergies: Allergies  Allergen Reactions   Ibuprofen  Other (See Comments)    Caused ulcers     Prior to Admission medications   Medication Sig Start Date End Date Taking? Authorizing Provider  amphetamine-dextroamphetamine (ADDERALL) 30 MG tablet Take 30 mg by mouth 2 (two) times daily. 07/20/23  Yes [provider]  ondansetron  (ZOFRAN ) 4 MG tablet Take 1 tablet (4 mg total) by mouth every 6 (six) hours as needed for nausea or vomiting. 08/26/23 09/25/23 Yes Laurence Locus, DO  ondansetron  (ZOFRAN -ODT) 4 MG disintegrating tablet Take 1 tablet (4 mg total) by mouth every 8 (eight) hours as needed for nausea or vomiting. Patient taking differently: Take 4 mg by mouth every 8 (eight)  hours as needed for nausea or vomiting (dissolve orally). 08/29/23  Yes G[place], [place]  N, FNP  Oxycodone  HCl 10 MG TABS Take 10 mg by mouth every 6 (six) hours as needed (for pain).   Yes [provider]  pantoprazole  (PROTONIX ) 40 MG tablet Take 1 tablet (40 mg total) by mouth 2 (two) times daily. 08/26/23 11/24/23 Yes Laurence Locus, DO  promethazine  (PHENERGAN ) 25 MG tablet Take 1 tablet (25 mg total) by mouth every 6 (six) hours as needed for nausea or vomiting. 08/26/23  Yes Laurence Locus, DO  sucralfate  (CARAFATE ) 1 g tablet Take 1 tablet (1 g total) by mouth 4 (four) times daily -  with meals and at bedtime. 08/26/23 11/24/23 Yes Laurence Locus, DO  oxyCODONE  (OXYCONTIN ) 10 mg 12 hr tablet Take 1 tablet (10 mg total) by  mouth every 12 (twelve) hours for 3 days. Patient not taking: Reported on 08/29/2023 08/29/23 09/01/23  Dreama Dorsie SAILOR, FNP    ___________________________________________________________________________________________________ Physical Exam:    08/29/2023    8:00 PM 08/29/2023    5:15 PM 08/29/2023    5:00 PM  Vitals with BMI  Systolic 160 145 857  Diastolic 94 73 80  Pulse 65 63 71     1. General:  in No ***Acute distress***increased work of breathing ***complaining of severe pain****agitated * Chronically ill *well *cachectic *toxic acutely ill -appearing 2. Psychological: Alert and *** Oriented 3. Head/ENT:   Moist *** Dry Mucous Membranes                          Head Non traumatic, neck supple                          Normal *** Poor Dentition 4. SKIN: normal *** decreased Skin turgor,  Skin clean Dry and intact no rash    5. Heart: Regular rate and rhythm no*** Murmur, no Rub or gallop 6. Lungs: ***Clear to auscultation bilaterally, no wheezes or crackles   7. Abdomen: Soft, ***non-tender, Non distended *** obese ***bowel sounds present 8. Lower extremities: no clubbing, cyanosis, no ***edema 9. Neurologically Grossly intact, moving all 4 extremities equally ***  strength 5 out of 5 in all 4 extremities cranial nerves II through XII intact 10. MSK: Normal range of motion    Chart has been reviewed  ______________________________________________________________________________________________  Assessment/Plan  ***  Admitted for *** Generalized abdominal pain ***  Nausea and vomiting, unspecified vomiting type ***  Duodenal ulcer ***    Present on Admission: **None**     No problem-specific Assessment & Plan notes found for this encounter.    Other plan as per orders.  DVT prophylaxis:  SCD *** Lovenox        Code Status:    Code Status: Prior FULL CODE *** DNR/DNI ***comfort care as per patient ***family  I had personally discussed CODE STATUS with patient and family*  ACP *** none has been reviewed ***   Family Communication:   Family not at  Bedside  plan of care was discussed on the phone with *** Son, Daughter, Wife, Husband, Sister, Brother , father, mother  Diet  Diet Orders (From admission, onward)     Start     Ordered   08/29/23 1527  Diet NPO time specified  Diet effective now        08/29/23 1526            Disposition Plan:   *** likely will need placement for rehabilitation                          Back to current facility when stable                            To home once workup is complete and patient is stable  ***Following barriers for discharge:                             Chest pain *** Stroke *** Syncope ***work up is complete  Electrolytes corrected                               Anemia corrected h/H stable                             Pain controlled with PO medications                               Afebrile, white count improving able to transition to PO antibiotics                             Will need to be able to tolerate PO                            Will likely need home health, home O2, set up                           Will need consultants to evaluate  patient prior to discharge                           Work of breathing improves       Consult Orders  (From admission, onward)           Start     Ordered   08/29/23 2143  Consult to hospitalist  Once       Provider:  (Not yet assigned)  Question Answer Comment  Place call to: [facility]   Reason for Consult Admit      08/29/23 2142                              ***Would benefit from PT/OT eval prior to DC  Ordered                   Swallow eval - SLP ordered                   Diabetes care coordinator                   Transition of care consulted                   Nutrition    consulted                  Wound care  consulted                   Palliative care    consulted                   Behavioral health  consulted                    Consults called: ***  NONE   Admission status:  ED Disposition     ED Disposition  Admit   Condition  --   Comment  The patient appears reasonably stabilized for admission considering the current resources, flow, and capabilities available in the ED at this time, and I doubt any other [facility] requiring further screening and/or treatment in the ED prior to admission  is  present.           Obs***  ***  inpatient     I Expect 2 midnight stay secondary to severity of patient's current illness need for inpatient interventions justified by the following: ***hemodynamic instability despite optimal treatment (tachycardia *hypotension * tachypnea *hypoxia, hypercapnia) *** Severe lab/radiological/exam abnormalities including:    Generalized abdominal pain ***  Nausea and vomiting, unspecified vomiting type ***  Duodenal ulcer ***  and extensive comorbidities including: *substance abuse  *Chronic pain *DM2  * CHF * CAD  * COPD/asthma *Morbid Obesity * CKD *dementia *liver disease *history of stroke with residual deficits *  malignancy, * sickle cell disease  History of amputation Chronic  anticoagulation  That are currently affecting medical management.   I expect  patient to be hospitalized for 2 midnights requiring inpatient medical care.  Patient is at high risk for adverse outcome (such as loss of life or disability) if not treated.  Indication for inpatient stay as follows:  Severe change from baseline regarding mental status Hemodynamic instability despite maximal medical therapy,  severe pain requiring acute inpatient management,  inability to maintain oral hydration   persistent chest pain despite medical management Need for operative/procedural  intervention New or worsening hypoxia ongoing suicidal ideations   Need for IV antibiotics, IV fluids,, IV pain medications, IV anticoagulation,  IV rate controling medications, IV antihypertensives need for biPAP Frequent labs    Level of care   *** tele  For 12H 24H     medical floor       progressive     stepdown   tele indefinitely please discontinue once patient no longer qualifies COVID-19 Labs    Critical***  Patient is critically ill due to  hemodynamic instability * respiratory failure *severe sepsis* ongoing chest pain*  They are at high risk for life/limb threatening clinical deterioration requiring frequent reassessment and modifications of care.  Services provided include examination of the patient, review of relevant ancillary tests, prescription of lifesaving therapies, review of medications and prophylactic therapy.  Total critical care time excluding separately billable procedures: 60*  Minutes.    [name] 08/29/2023, 10:09 PM ***  [facility]     after 2 AM please page floor coverage   If 7AM-7PM, please contact the day team taking care of the patient using [email or web address]

## 2023-08-29 NOTE — ED Provider Notes (Signed)
 [facility]    CSN: [id number] Arrival date & time: 08/29/23  1133      History   Chief Complaint Chief Complaint  Patient presents with   Emesis    HPI [name] is a 33 y.o. male.   Patient presents to clinic over concerns of nausea and vomiting that restarted this morning.  Reports this started after foot surgery but got better during recent admission in the hospital with IV Reglan .  Was sent home with Zofran  tablets and Phenergan  tablets, has been taking these without any improvement in nausea and vomiting today.  Has vomited 4 times.  Emesis without blood.  Thinks the nausea and vomiting could be coming from his severe right foot pain.  Had surgery for plantar fasciitis in June and reports he broke his foot shortly after he had surgery.  He does have a follow-up appointment on Monday, in 2 days.  He has been prescribed oxycodone  with Tylenol  and most recently oxycodone , last dose and last fill of the oxycodone  10 mg was yesterday around 10 AM.   Wearing a boot in clinic with me right foot.  The history is provided by the patient and medical records.  Emesis   Past Medical History:  Diagnosis Date   ADHD    Whooping cough     Patient Active Problem List   Diagnosis Date Noted   Duodenal ulcer due to nonsteroidal anti-inflammatory drug (NSAID) 08/26/2023   Right foot pain 08/26/2023   Intractable nausea and vomiting 08/23/2023   Hyperproteinemia 08/20/2023   Hypercalcemia 08/20/2023   Hypokalemia 08/20/2023   Class 1 obesity 08/20/2023    Past Surgical History:  Procedure Laterality Date   ESOPHAGOGASTRODUODENOSCOPY Lopez/A 08/25/2023   Procedure: EGD (ESOPHAGOGASTRODUODENOSCOPY);  Surgeon: Clifford Estefana DEL, [name];  Location: [facility];  Service: Gastroenterology;  Laterality: Lopez/A;   FOOT SURGERY  06/2023       Home Medications    Prior to Admission medications   Medication Sig Start Date End Date Taking? Authorizing Provider   ondansetron  (ZOFRAN -ODT) 4 MG disintegrating tablet Take 1 tablet (4 mg total) by mouth every 8 (eight) hours as needed for nausea or vomiting. 08/29/23  Yes Clifford Drakes  Lopez, [name]  oxyCODONE  HCl 10 MG TABA Take 1 tablet by mouth every 6 (six) hours as needed for up to 3 days. 08/29/23 09/01/23 Yes Clifford Dingee  Lopez, [name]  amphetamine-dextroamphetamine (ADDERALL) 30 MG tablet Take 1 tablet by mouth 2 (two) times daily. 07/20/23   [provider]  ondansetron  (ZOFRAN ) 4 MG tablet Take 1 tablet (4 mg total) by mouth every 6 (six) hours as needed for nausea or vomiting. 08/26/23 09/25/23  Clifford Locus, [name]  pantoprazole  (PROTONIX ) 40 MG tablet Take 1 tablet (40 mg total) by mouth 2 (two) times daily. 08/26/23 11/24/23  Clifford Locus, [name]  promethazine  (PHENERGAN ) 25 MG tablet Take 1 tablet (25 mg total) by mouth every 6 (six) hours as needed for nausea or vomiting. 08/26/23   Clifford Locus, [name]  sucralfate  (CARAFATE ) 1 g tablet Take 1 tablet (1 g total) by mouth 4 (four) times daily -  with meals and at bedtime. 08/26/23 11/24/23  Clifford Locus, [name]    Family History Family History  Problem Relation Age of Onset   Hypertension Mother     Social History Social History   Tobacco Use   Smoking status: Every Day    Types: E-cigarettes   Smokeless tobacco: Never  Vaping Use   Vaping status: Every Day  Substance Use Topics   Alcohol use: Yes    Comment: rare   Drug use: No     Allergies   Patient has no known allergies.   Review of Systems Review of Systems  Per HPI  Physical Exam Triage Vital Signs ED Triage Vitals  Encounter Vitals Group     BP 08/29/23 1144 (!) 162/99     Girls Systolic BP Percentile --      Girls Diastolic BP Percentile --      Boys Systolic BP Percentile --      Boys Diastolic BP Percentile --      Pulse Rate 08/29/23 1144 72     Resp 08/29/23 1144 17     Temp 08/29/23 1144 (!) 97.5 F (36.4 C)     Temp Source 08/29/23 1144 Oral     SpO2 08/29/23 1144 96 %      Weight --      Height --      Head Circumference --      Peak Flow --      Pain Score 08/29/23 1148 10     Pain Loc --      Pain Education --      Exclude from Growth Chart --    No data found.  Updated Vital Signs BP (!) 158/100 (BP Location: Right Arm)   Pulse 72   Temp (!) 97.5 F (36.4 C) (Oral)   Resp 17   SpO2 96%   Visual Acuity Right Eye Distance:   Left Eye Distance:   Bilateral Distance:    Right Eye Near:   Left Eye Near:    Bilateral Near:     Physical Exam Vitals and nursing note reviewed.  Constitutional:      Appearance: Normal appearance.  HENT:     Head: Normocephalic and atraumatic.     Right Ear: External ear normal.     Left Ear: External ear normal.     Nose: Nose normal.     Mouth/Throat:     Mouth: Mucous membranes are moist.  Cardiovascular:     Rate and Rhythm: Normal rate.  Pulmonary:     Effort: Pulmonary effort is normal. No respiratory distress.  Neurological:     General: No focal deficit present.     Mental Status: He is alert and oriented to person, place, and time.  Psychiatric:        Mood and Affect: Mood normal.        Behavior: Behavior normal.      UC Treatments / Results  Labs (all labs ordered are listed, but only abnormal results are displayed) Labs Reviewed - No data to display  EKG   Radiology No results found.  Procedures Procedures (including critical care time)  Medications Ordered in UC Medications  metoCLOPramide  (REGLAN ) injection 10 mg (10 mg Intramuscular Given 08/29/23 1218)    Initial Impression / Assessment and Plan / UC Course  I have reviewed the triage vital signs and the nursing notes.  Pertinent labs & imaging results that were available during my care of the patient were reviewed by me and considered in my medical decision making (see chart for details).  Vitals and triage reviewed, patient is hemodynamically stable.  Hypertensive.  Will trial ODT Zofran  at home and IM Reglan  in  clinic.  Discussed importance of following up with podiatry/orthopedic on Monday as scheduled.  No new injury to foot, imaging deferred.  Will give short-term supply of pain medication until  this follow-up appointment.  Unfortunately, seems as if patient has been on consistent narcotics for the past few months and this may have been contributing to the nausea and vomiting today.  Discussed potential for withdrawal.  Encourage patient to follow-up.  Plan of care, follow-up care return precautions given, no questions at this time.     Final Clinical Impressions(s) / UC Diagnoses   Final diagnoses:  Nausea and vomiting, unspecified vomiting type  Right foot pain     Discharge Instructions      Use the orally disintegrating Zofran  tablet every 8 hours for nausea.  Take the oxycodone  sparingly as needed for severe flank pain.  Supplement with Tylenol  as needed.  Please follow-up with your foot specialist on Monday as scheduled to get further answers.  It may be beneficial to follow-up with pain management as well.  Seek immediate care at the nearest emergency department for any worsening pain, or inability to hold down food or fluids.      ED Prescriptions     Medication Sig Dispense Auth. Provider   oxyCODONE  HCl 10 MG TABA Take 1 tablet by mouth every 6 (six) hours as needed for up to 3 days. 12 tablet [name], [name]  Lopez, [name]   ondansetron  (ZOFRAN -ODT) 4 MG disintegrating tablet Take 1 tablet (4 mg total) by mouth every 8 (eight) hours as needed for nausea or vomiting. 20 tablet [name], Clifford Pottenger  Lopez, [name]      I have reviewed the PDMP during this encounter.   [name], Clifford Treece  Lopez, [name] 08/29/23 [id number]

## 2023-08-29 NOTE — Assessment & Plan Note (Signed)
 Continue Protonix  40 mg IV BID CT abd non acute

## 2023-08-29 NOTE — Assessment & Plan Note (Signed)
 Advised attempt to avoid long term use of opioids for pain management

## 2023-08-29 NOTE — Discharge Instructions (Signed)
 Use the orally disintegrating Zofran  tablet every 8 hours for nausea.  Take the oxycodone  sparingly as needed for severe flank pain.  Supplement with Tylenol  as needed.  Please follow-up with your foot specialist on Monday as scheduled to get further answers.  It may be beneficial to follow-up with pain management as well.  Seek immediate care at the nearest emergency department for any worsening pain, or inability to hold down food or fluids.

## 2023-08-29 NOTE — Assessment & Plan Note (Signed)
 Now seems to be improving  Continue supportive management

## 2023-08-29 NOTE — ED Triage Notes (Signed)
 Pt reports abd pain r/t bleeding ulcer x2 weeks. N/V/D reported. Recent admission for same.

## 2023-08-29 NOTE — ED Triage Notes (Signed)
 Pt presents for right foot pain. 10/10. States he broke his foot awhile back and was prescribed oxy for pain but ran out. His next ortho appointment is Monday. Pt is asking for pain treatment.   Pt cannot take ibuprofen  due to gastritis and previous GI bleed from NSAID use. He was discharged for ED on 7/30.  Pt also states he has still been vomiting and taking zofran , phenergan , and pantoprazole 

## 2023-08-29 NOTE — Subjective & Objective (Signed)
 Abdominal pain chronic duodenal ulcer He takes Protonix  and Carafate  at home  Last EGD was 3 days ago showed an ulcer  CT abd today non acute  Pain is very severe

## 2023-08-29 NOTE — Assessment & Plan Note (Signed)
-   will replace electrolytes and repeat  check Mg, phos and Ca level and replace as needed Monitor on telemetry   Lab Results  Component Value Date   K 3.1 (L) 08/29/2023     Lab Results  Component Value Date   CREATININE 0.68 08/29/2023   Lab Results  Component Value Date   MG 2.5 (H) 08/23/2023   Lab Results  Component Value Date   CALCIUM 9.3 08/29/2023

## 2023-08-29 NOTE — ED Provider Notes (Signed)
 [facility] AT [facility] Provider Note   CSN: [id number] Arrival date & time: 08/29/23  1510     History {Add pertinent medical, surgical, social history, OB history to HPI:1} Chief Complaint  Patient presents with   Abdominal Pain    [name] is a 33 y.o. male with PMH as listed below who presents with nausea/vomiting and abdominal pain. Recent recurrent admissions for abd pain /N/V most recently 08/23/23-08/26/23, during which time EGD showed duodenal ulcer likely related to NSAID use, and biopsies were taken. GI instructed PPI BID x 2 months and sucralfate  TID x 7 days. Reports that he was doing well for 2 days after discharge, taking medications as prescribed, until today and he has had more intense abdominal pain, all-over, rated 10/10, and intractable nausea/vomiting. Taking zofran  and phenergan , not helping, vomiting 8 times, non-bilious/nonbloody.   Also reports severe R foot pain - had surgery for plantar fasciitis in June 2025, broke his foot shortly after surgery, and has been taking oxycodone  at home, wearing CAM boot.   Past Medical History:  Diagnosis Date   ADHD    Whooping cough        Home Medications Prior to Admission medications   Medication Sig Start Date End Date Taking? Authorizing Provider  amphetamine-dextroamphetamine (ADDERALL) 30 MG tablet Take 1 tablet by mouth 2 (two) times daily. 07/20/23   [provider]  ondansetron  (ZOFRAN ) 4 MG tablet Take 1 tablet (4 mg total) by mouth every 6 (six) hours as needed for nausea or vomiting. 08/26/23 09/25/23  Laurence Locus, DO  ondansetron  (ZOFRAN -ODT) 4 MG disintegrating tablet Take 1 tablet (4 mg total) by mouth every 8 (eight) hours as needed for nausea or vomiting. 08/29/23   Dreama, [place]  N, FNP  oxyCODONE  (OXYCONTIN ) 10 mg 12 hr tablet Take 1 tablet (10 mg total) by mouth every 12 (twelve) hours for 3 days. 08/29/23 09/01/23  Dreama, [place]  N, FNP   pantoprazole  (PROTONIX ) 40 MG tablet Take 1 tablet (40 mg total) by mouth 2 (two) times daily. 08/26/23 11/24/23  Laurence Locus, DO  promethazine  (PHENERGAN ) 25 MG tablet Take 1 tablet (25 mg total) by mouth every 6 (six) hours as needed for nausea or vomiting. 08/26/23   Laurence Locus, DO  sucralfate  (CARAFATE ) 1 g tablet Take 1 tablet (1 g total) by mouth 4 (four) times daily -  with meals and at bedtime. 08/26/23 11/24/23  Laurence Locus, DO      Allergies    Patient has no known allergies.    Review of Systems   Review of Systems A 10 point review of systems was performed and is negative unless otherwise reported in HPI.  Physical Exam Updated Vital Signs BP (!) 166/114 (BP Location: Right Arm)   Pulse 81   Temp 98.5 F (36.9 C) (Oral)   Resp 18   Ht 6' 3 (1.905 m)   Wt 102.1 kg   SpO2 100%   BMI 28.12 kg/m  Physical Exam General: Severely uncomfortable tearful male, lying in bed.  HEENT: PERRLA, Sclera anicteric, MMM, trachea midline.  Cardiology: RRR, no murmurs/rubs/gallops.  Resp: Normal respiratory rate and effort. CTAB, no wheezes, rhonchi, crackles.  Abd: Soft, diffusely severely tender with involuntary guarding. GU: Deferred. MSK: No peripheral edema or signs of trauma. Extremities without deformity or TTP. No cyanosis or clubbing. Skin: warm, dry.  Back: No CVA tenderness Neuro: A&Ox4, CNs II-XII grossly intact. MAEs. Sensation grossly intact.  Psych: Normal mood and affect.  ED Results / Procedures / Treatments   Labs (all labs ordered are listed, but only abnormal results are displayed) Labs Reviewed  LIPASE, BLOOD  COMPREHENSIVE METABOLIC PANEL WITH GFR  CBC  URINALYSIS, ROUTINE W REFLEX MICROSCOPIC    EKG None  Radiology No results found.  Procedures Procedures  {Document cardiac monitor, telemetry assessment procedure when appropriate:1}  Medications Ordered in ED Medications  lactated ringers  bolus 1,000 mL (1,000 mLs Intravenous New Bag/Given  08/29/23 1633)  HYDROmorphone  (DILAUDID ) injection 1 mg (1 mg Intravenous Given 08/29/23 1632)  ondansetron  (ZOFRAN ) injection 4 mg (4 mg Intravenous Given 08/29/23 1633)    ED Course/ Medical Decision Making/ A&P                          Medical Decision Making Amount and/or Complexity of Data Reviewed Labs: ordered. Radiology: ordered.  Risk Prescription drug management.    This patient presents to the ED for concern of intractable nausea vomiting and abdominal pain, this involves an extensive number of treatment options, and is a complaint that carries with it a high risk of complications and morbidity.  I considered the following differential and admission for this acute, potentially life threatening condition.   MDM:    Consider ongoing pain due to duodenal ulcer or consider possible complication such as perforation, especially given his significant tenderness palpation and involuntary guarding.  Consider electrolyte derangements due to nausea vomiting.  Nonbloody emesis, lower concern for Boerhaave's or Mallory-Weiss tear.  Will give fluids, analgesia and antiemetics.     Labs: I Ordered, and personally interpreted labs.  The pertinent results include: Those listed above  Imaging Studies ordered: I ordered imaging studies including CT of the pelvis with contrast I independently visualized and interpreted imaging. I agree with the radiologist interpretation  Additional history obtained from chart review.   Cardiac Monitoring: The patient was maintained on a cardiac monitor.  I personally viewed and interpreted the cardiac monitored which showed an underlying rhythm of: Normal sinus rhythm  Reevaluation: After the interventions noted above, I reevaluated the patient and found that they have :improved  Social Determinants of Health:  lives independently  Disposition:  ***  Co morbidities that complicate the patient evaluation  Past Medical History:  Diagnosis Date    ADHD    Whooping cough      Medicines Meds ordered this encounter  Medications   lactated ringers  bolus 1,000 mL   DISCONTD: fentaNYL  (SUBLIMAZE ) injection 50 mcg   DISCONTD: fentaNYL  (SUBLIMAZE ) injection 100 mcg   HYDROmorphone  (DILAUDID ) injection 1 mg   ondansetron  (ZOFRAN ) injection 4 mg    I have reviewed the patients home medicines and have made adjustments as needed  Problem List / ED Course: Problem List Items Addressed This Visit   None        {Document critical care time when appropriate:1} {Document review of labs and clinical decision tools ie heart score, Chads2Vasc2 etc:1}  {Document your independent review of radiology images, and any outside records:1} {Document your discussion with family members, caretakers, and with consultants:1} {Document social determinants of health affecting pt's care:1} {Document your decision making why or why not admission, treatments were needed:1}  This note was created using dictation software, which may contain spelling or grammatical errors.

## 2023-08-30 LAB — CBC
HCT: 41.3 % (ref 39.0–52.0)
Hemoglobin: 14 g/dL (ref 13.0–17.0)
MCH: 31.7 pg (ref 26.0–34.0)
MCHC: 33.9 g/dL (ref 30.0–36.0)
MCV: 93.7 fL (ref 80.0–100.0)
Platelets: 238 K/uL (ref 150–400)
RBC: 4.41 MIL/uL (ref 4.22–5.81)
RDW: 12.9 % (ref 11.5–15.5)
WBC: 9.4 K/uL (ref 4.0–10.5)
nRBC: 0 % (ref 0.0–0.2)

## 2023-08-30 LAB — COMPREHENSIVE METABOLIC PANEL WITH GFR
ALT: 29 U/L (ref 0–44)
AST: 17 U/L (ref 15–41)
Albumin: 3.9 g/dL (ref 3.5–5.0)
Alkaline Phosphatase: 49 U/L (ref 38–126)
Anion gap: 15 (ref 5–15)
BUN: 8 mg/dL (ref 6–20)
CO2: 23 mmol/L (ref 22–32)
Calcium: 9.2 mg/dL (ref 8.9–10.3)
Chloride: 101 mmol/L (ref 98–111)
Creatinine, Ser: 0.77 mg/dL (ref 0.61–1.24)
GFR, Estimated: >60 mL/min (ref >60)
Glucose, Bld: 89 mg/dL (ref 70–99)
Potassium: 3.4 mmol/L — ABNORMAL LOW (ref 3.5–5.1)
Sodium: 139 mmol/L (ref 135–145)
Total Bilirubin: 0.9 mg/dL (ref 0.0–1.2)
Total Protein: 6.6 g/dL (ref 6.5–8.1)

## 2023-08-30 LAB — PHOSPHORUS: Phosphorus: 3.9 mg/dL (ref 2.5–4.6)

## 2023-08-30 LAB — MAGNESIUM: Magnesium: 2.4 mg/dL (ref 1.7–2.4)

## 2023-08-30 MED ORDER — ONDANSETRON HCL 4 MG/2ML IJ SOLN: 4.0000 mg | Freq: Four times a day (QID) | INTRAMUSCULAR | Status: DC | PRN

## 2023-08-30 MED ORDER — METOCLOPRAMIDE HCL 5 MG/ML IJ SOLN: 5.0000 mg | Freq: Four times a day (QID) | INTRAMUSCULAR | Status: DC | PRN

## 2023-08-30 MED ORDER — ACETAMINOPHEN 500 MG PO TABS
1000.0000 mg | ORAL_TABLET | Freq: Four times a day (QID) | ORAL | Status: DC
  Administered 2023-08-30: 1000 mg via ORAL
  Filled 2023-08-30: qty 2

## 2023-08-30 MED ORDER — TRAMADOL HCL 50 MG PO TABS: 50.0000 mg | ORAL_TABLET | Freq: Four times a day (QID) | ORAL | 0 refills | Status: DC | PRN

## 2023-08-30 MED ORDER — METHOCARBAMOL 1000 MG/10ML IJ SOLN
500.0000 mg | Freq: Four times a day (QID) | INTRAMUSCULAR | Status: DC | PRN
  Administered 2023-08-30: 500 mg via INTRAVENOUS
  Filled 2023-08-30: qty 10

## 2023-08-30 MED ORDER — ACETAMINOPHEN 650 MG RE SUPP: 650.0000 mg | Freq: Four times a day (QID) | RECTAL | Status: DC | PRN

## 2023-08-30 MED ORDER — ACETAMINOPHEN 325 MG PO TABS: 650.0000 mg | ORAL_TABLET | Freq: Four times a day (QID) | ORAL | Status: DC | PRN

## 2023-08-30 MED ORDER — SODIUM CHLORIDE 0.9 % IV SOLN: INTRAVENOUS | Status: AC

## 2023-08-30 MED ORDER — TRAMADOL HCL 50 MG PO TABS
50.0000 mg | ORAL_TABLET | Freq: Four times a day (QID) | ORAL | Status: DC | PRN
  Administered 2023-08-30 (×2): 50 mg via ORAL
  Filled 2023-08-30 (×2): qty 1

## 2023-08-30 MED ORDER — ACETAMINOPHEN 500 MG PO TABS: 1000.0000 mg | ORAL_TABLET | Freq: Four times a day (QID) | ORAL | 0 refills | Status: DC

## 2023-08-30 MED ORDER — FENTANYL CITRATE PF 50 MCG/ML IJ SOSY
12.5000 ug | PREFILLED_SYRINGE | INTRAMUSCULAR | Status: DC | PRN
  Administered 2023-08-30 (×3): 50 ug via INTRAVENOUS
  Filled 2023-08-30 (×3): qty 1

## 2023-08-30 MED ORDER — PANTOPRAZOLE SODIUM 40 MG IV SOLR
40.0000 mg | Freq: Two times a day (BID) | INTRAVENOUS | Status: DC
  Administered 2023-08-30: 40 mg via INTRAVENOUS
  Filled 2023-08-30: qty 10

## 2023-08-30 MED ORDER — ONDANSETRON HCL 4 MG PO TABS
4.0000 mg | ORAL_TABLET | Freq: Four times a day (QID) | ORAL | Status: DC | PRN
Start: 2023-08-30 — End: 2023-08-30

## 2023-08-30 MED ORDER — MELATONIN 5 MG PO TABS
5.0000 mg | ORAL_TABLET | Freq: Every evening | ORAL | Status: DC | PRN
  Administered 2023-08-30: 5 mg via ORAL
  Filled 2023-08-30: qty 1

## 2023-08-30 NOTE — Discharge Summary (Signed)
 Physician Discharge Summary   Patient: [name] MRN: [id number] DOB: 07-Jul-1990  Admit date:     08/29/2023  Discharge date: 08/30/23  Discharge Physician: Brigida Bureau   PCP: Sun, Vyvyan, MD   Recommendations at discharge:    Discharge home. Keep follow up appointments. Wear boot when not in bed. Use ice and elevation when in bed or seated.  Discharge Diagnoses: Principal Problem:   PUD (peptic ulcer disease) Active Problems:   Intractable nausea and vomiting   Hypokalemia   Duodenal ulcer due to nonsteroidal anti-inflammatory drug (NSAID)   Right foot pain  Resolved Problems:   * No resolved hospital problems. [facility] Course: The patient was admitted early this morning by my colleague Dr. Silvester. He was given supportive management with antiemetics. He was given IV Protonix  and sucralfate . NSAIDS were avoided. His potassium was supplemented. He was given pain control and methocarbamol .   This morning the patient states that he has no further abdominal pain and wants to advance his diet. He states that he feels well enough to go home. He will be discharged on round the clock acetaminophen . He is advised to wear the boot supplied by podiatry if he is up. If he is sitting or lying down he is to keep the foot elevated and keep ice on it.  He has appointments with orthopedic surgery and GI tomorrow that he is advised to keep.  Assessment and Plan: Present on Admission:  PUD (peptic ulcer disease)  Intractable nausea and vomiting  Duodenal ulcer due to nonsteroidal anti-inflammatory drug (NSAID)  Hypokalemia  Right foot pain     Intractable nausea and vomiting Resolved.    Duodenal ulcer due to nonsteroidal anti-inflammatory drug (NSAID) Continue Protonix  40 mg PO BID Keep follow up with GI.   Hypokalemia Replete  Right Foot Pain Patient is being discharged with PO acetaminophen  1000mg  Q 6 hours and a few ultram  tablets. He is to ice the foot, and avoid  NSAID use.   Consultants: [facility] Procedures performed: None  Disposition: Home Diet recommendation:  Discharge Diet Orders (From admission, onward)     Start     Ordered   08/30/23 0000  Diet - low sodium heart healthy        08/30/23 1240           Regular diet DISCHARGE MEDICATION: Allergies as of 08/30/2023       Reactions   Ibuprofen  Other (See Comments)   Caused ulcers        Medication List     STOP taking these medications    ondansetron  4 MG tablet Commonly known as: Zofran    oxyCODONE  10 mg 12 hr tablet Commonly known as: OXYCONTIN    Oxycodone  HCl 10 MG Tabs   promethazine  25 MG tablet Commonly known as: PHENERGAN        TAKE these medications    acetaminophen  500 MG tablet Commonly known as: TYLENOL  Take 2 tablets (1,000 mg total) by mouth every 6 (six) hours.   amphetamine-dextroamphetamine 30 MG tablet Commonly known as: ADDERALL Take 30 mg by mouth 2 (two) times daily.   ondansetron  4 MG disintegrating tablet Commonly known as: ZOFRAN -ODT Take 1 tablet (4 mg total) by mouth every 8 (eight) hours as needed for nausea or vomiting. What changed: reasons to take this   pantoprazole  40 MG tablet Commonly known as: Protonix  Take 1 tablet (40 mg total) by mouth 2 (two) times daily.   sucralfate  1 g tablet Commonly known as:  CARAFATE  Take 1 tablet (1 g total) by mouth 4 (four) times daily -  with meals and at bedtime.   traMADol  50 MG tablet Commonly known as: ULTRAM  Take 1 tablet (50 mg total) by mouth every 6 (six) hours as needed for moderate pain (pain score 4-6).        Discharge Exam: Filed Weights   08/29/23 1519  Weight: 102.1 kg   Exam:  Constitutional:  The patient is awake, alert, and oriented x 3. No acute distress. Eyes:  pupils and irises appear normal Normal lids and conjunctivae ENMT:  grossly normal hearing  Lips appear normal external ears, nose appear normal Oropharynx: mucosa, tongue,posterior pharynx  appear normal Neck:  neck appears normal, no masses, normal ROM, supple no thyromegaly Respiratory:  No increased work of breathing. No wheezes, rales, or rhonchi No tactile fremitus Cardiovascular:  Regular rate and rhythm No murmurs, ectopy, or gallups. No lateral PMI. No thrills. Abdomen:  Abdomen is soft, non-tender, non-distended No hernias, masses, or organomegaly Normoactive bowel sounds.  Musculoskeletal:  No cyanosis, clubbing, or edema Skin:  No rashes, lesions, ulcers palpation of skin: no induration or nodules Neurologic:  CN 2-12 intact Sensation all 4 extremities intact Psychiatric:  Mental status Mood, affect appropriate Orientation to person, place, time  judgment and insight appear intact   Condition at discharge: good  The results of significant diagnostics from this hospitalization (including imaging, microbiology, ancillary and laboratory) are listed below for reference.   Imaging Studies: CT ABDOMEN PELVIS W CONTRAST Result Date: 08/29/2023 CLINICAL DATA:  Abdominal pain, evaluate for perforated ulcer. Patient reports abdominal pain and bleeding ulcer 2 weeks. Nausea vomiting and diarrhea. EXAM: CT ABDOMEN AND PELVIS WITH CONTRAST TECHNIQUE: Multidetector CT imaging of the abdomen and pelvis was performed using the standard protocol following bolus administration of intravenous contrast. RADIATION DOSE REDUCTION: This exam was performed according to the departmental dose-optimization program which includes automated exposure control, adjustment of the mA and/or kV according to patient size and/or use of iterative reconstruction technique. CONTRAST:  OMNIPAQUE  IOHEXOL  300 MG/ML  SOLN COMPARISON:  08/23/2023 FINDINGS: Lower chest: Heart is normal size.  Visualized lung bases are clear. Hepatobiliary: There is mild diffuse low-attenuation of the liver which may represent a degree of steatosis as there is no focal liver mass. Gallbladder and biliary tree are  normal. Pancreas: Normal. Spleen: Normal. Adrenals/Urinary Tract: Adrenal glands are normal. Kidneys are normal in size without hydronephrosis or nephrolithiasis. Ureters and bladder are normal. Stomach/Bowel: Stomach and small bowel are unremarkable. Appendix is normal. Colon is normal. Vascular/Lymphatic: Abdominal aorta is normal in caliber. Remaining vascular structures are unremarkable. No adenopathy. Reproductive: Normal. Other: No free fluid or focal inflammatory change. Musculoskeletal: No focal abnormality. IMPRESSION: 1. No acute findings in the abdomen/pelvis. No evidence of gastric ulcer. 2. Possible mild hepatic steatosis. Electronically Signed   By: Toribio Agreste M.D.   On: 08/29/2023 17:46   CT ABDOMEN PELVIS WO CONTRAST Result Date: 08/23/2023 CLINICAL DATA:  Nausea with nonlocalized abdominal pain. EXAM: CT ABDOMEN AND PELVIS WITHOUT CONTRAST TECHNIQUE: Multidetector CT imaging of the abdomen and pelvis was performed following the standard protocol without IV contrast. RADIATION DOSE REDUCTION: This exam was performed according to the departmental dose-optimization program which includes automated exposure control, adjustment of the mA and/or kV according to patient size and/or use of iterative reconstruction technique. COMPARISON:  08/20/2023 FINDINGS: Lower chest: 2 mm right lower lobe nodule on 44/6 is stable in the interval since the most recent  comparison study and also comparing back to an exam from 10/25/2017, consistent with benign etiology. No followup imaging is recommended. Hepatobiliary: No suspicious focal abnormality in the liver on this study without intravenous contrast. Layering high attenuation material in the lumen of the gallbladder may reflect sludge or some vicarious excretion of contrast from previous recent CT imaging. No intrahepatic or extrahepatic biliary dilation. Pancreas: No focal mass lesion. No dilatation of the main duct. No intraparenchymal cyst. No  peripancreatic edema. Spleen: No splenomegaly. No suspicious focal mass lesion. Adrenals/Urinary Tract: No adrenal nodule or mass. Kidneys unremarkable. No evidence for hydroureter. The urinary bladder appears normal for the degree of distention. Stomach/Bowel: Stomach is unremarkable. No gastric wall thickening. No evidence of outlet obstruction. Duodenum is normally positioned as is the ligament of Treitz. No small bowel wall thickening. No small bowel dilatation. The terminal ileum is normal. The appendix is normal. No gross colonic mass. No colonic wall thickening. Vascular/Lymphatic: No abdominal aortic aneurysm. No abdominal aortic atherosclerotic calcification. There is no gastrohepatic or hepatoduodenal ligament lymphadenopathy. No retroperitoneal or mesenteric lymphadenopathy. No pelvic sidewall lymphadenopathy. Reproductive: The prostate gland and seminal vesicles are unremarkable. Other: No intraperitoneal free fluid. Musculoskeletal: No worrisome lytic or sclerotic osseous abnormality. IMPRESSION: 1. No acute findings in the abdomen or pelvis. Specifically, no findings to explain the patient's history of nausea and abdominal pain. 2. Layering high attenuation material in the lumen of the gallbladder may reflect sludge or vicarious excretion of contrast from previous recent CT imaging. Electronically Signed   By: Camellia Candle M.D.   On: 08/23/2023 10:52   CT HEAD WO CONTRAST Result Date: 08/23/2023 EXAM: CT HEAD WITHOUT CONTRAST 08/23/2023 10:21:16 AM TECHNIQUE: CT of the head was performed without the administration of intravenous contrast. Automated exposure control, iterative reconstruction, and/or weight based adjustment of the mA/kV was utilized to reduce the radiation dose to as low as reasonably achievable. COMPARISON: CT head without contrast 04/20/2014. CLINICAL HISTORY: Headache, increasing frequency or severity. Pt presents to the ED for severe headache and nausea that has been present  since yesterday when he was discharged from hospital. Pt was recently admitted for gastritis. Pt states that he was told to report to ED if his symptoms do improve. Pt has not been able sleep or eat because of the nausea and pain. FINDINGS: BRAIN AND VENTRICLES: No acute hemorrhage. Gray-white differentiation is preserved. No hydrocephalus. No extra-axial collection. No mass effect or midline shift. ORBITS: No acute abnormality. SINUSES: No acute abnormality. SOFT TISSUES AND SKULL: No acute soft tissue abnormality. No skull fracture. IMPRESSION: 1. No acute intracranial abnormality. Electronically signed by: Lonni Necessary MD 08/23/2023 10:42 AM EDT RP Workstation: [id number]   CT ABDOMEN PELVIS W CONTRAST Result Date: 08/20/2023 CLINICAL DATA:  Abdominal pain EXAM: CT ABDOMEN AND PELVIS WITH CONTRAST TECHNIQUE: Multidetector CT imaging of the abdomen and pelvis was performed using the standard protocol following bolus administration of intravenous contrast. RADIATION DOSE REDUCTION: This exam was performed according to the departmental dose-optimization program which includes automated exposure control, adjustment of the mA and/or kV according to patient size and/or use of iterative reconstruction technique. CONTRAST:  OMNIPAQUE  IOHEXOL  300 MG/ML  SOLN COMPARISON:  None Available. FINDINGS: Lower chest: Lung bases are clear. Hepatobiliary: No focal hepatic lesion. Normal gallbladder. No biliary duct dilatation. Common bile duct is normal. Pancreas: Pancreas is normal. No ductal dilatation. No pancreatic inflammation. Spleen: Normal spleen Adrenals/urinary tract: Adrenal glands and kidneys are normal. The ureters and bladder normal. Stomach/Bowel: Stomach,  small bowel, appendix, and cecum are normal. The colon and rectosigmoid colon are normal. Vascular/Lymphatic: Abdominal aorta is normal caliber. No periportal or retroperitoneal adenopathy. No pelvic adenopathy. Reproductive: Prostate unremarkable  Other: No free fluid. Musculoskeletal: No aggressive osseous lesion. IMPRESSION: 1. No acute findings in the abdomen pelvis. 2. Normal appendix. 3. No bowel obstruction. Electronically Signed   By: Jackquline Boxer M.D.   On: 08/20/2023 10:39   DG Foot Complete Right Result Date: 08/17/2023 Please see detailed radiograph report in office note.   Microbiology: Results for orders placed or performed during the hospital encounter of 08/20/23  Gastrointestinal Panel by PCR , Stool     Status: None   Collection Time: 08/21/23  3:51 AM   Specimen: Stool  Result Value Ref Range Status   Campylobacter species NOT DETECTED NOT DETECTED Final   Plesimonas shigelloides NOT DETECTED NOT DETECTED Final   Salmonella species NOT DETECTED NOT DETECTED Final   Yersinia enterocolitica NOT DETECTED NOT DETECTED Final   Vibrio species NOT DETECTED NOT DETECTED Final   Vibrio cholerae NOT DETECTED NOT DETECTED Final   Enteroaggregative E coli (EAEC) NOT DETECTED NOT DETECTED Final   Enteropathogenic E coli (EPEC) NOT DETECTED NOT DETECTED Final   Enterotoxigenic E coli (ETEC) NOT DETECTED NOT DETECTED Final   Shiga like toxin producing E coli (STEC) NOT DETECTED NOT DETECTED Final   Shigella/Enteroinvasive E coli (EIEC) NOT DETECTED NOT DETECTED Final   Cryptosporidium NOT DETECTED NOT DETECTED Final   Cyclospora cayetanensis NOT DETECTED NOT DETECTED Final   Entamoeba histolytica NOT DETECTED NOT DETECTED Final   Giardia lamblia NOT DETECTED NOT DETECTED Final   Adenovirus F40/41 NOT DETECTED NOT DETECTED Final   Astrovirus NOT DETECTED NOT DETECTED Final   Norovirus GI/GII NOT DETECTED NOT DETECTED Final   Rotavirus A NOT DETECTED NOT DETECTED Final   Sapovirus (I, II, IV, and V) NOT DETECTED NOT DETECTED Final    Comment: Performed at [facility], [address] [place], [place] [address]    Labs: CBC: Recent Labs  Lab 08/23/23 1838 08/24/23 0041 08/24/23 0447 08/24/23 1320  08/25/23 1020 08/26/23 0520 08/29/23 1527 08/30/23 0552  WBC 9.2  --  10.3  --  9.9 11.9* 8.8 9.4  NEUTROABS 6.2  --   --   --  7.4 7.0  --   --   HGB 14.4   < > 13.4 14.3 14.7 14.6 15.3 14.0  HCT 41.3   < > 38.9* 40.9 44.4 44.0 45.5 41.3  MCV 90.6  --  90.5  --  93.9 92.8 91.5 93.7  PLT 261  --  235  --  238 235 290 238   < > = values in this interval not displayed.   Basic Metabolic Panel: Recent Labs  Lab 08/23/23 2012 08/24/23 0447 08/25/23 1020 08/26/23 0520 08/29/23 1527 08/30/23 0552  NA  --  135 136 134* 135 139  K  --  3.3* 3.7 3.3* 3.1* 3.4*  CL  --  103 105 105 101 101  CO2  --  22 23 23 24 23   GLUCOSE  --  90 94 91 94 89  BUN  --  10 6 8 10 8   CREATININE  --  0.71 0.68 0.83 0.68 0.77  CALCIUM  --  8.5* 8.8* 8.9 9.3 9.2  MG 2.5*  --   --   --   --  2.4  PHOS  --   --   --   --   --  3.9   Liver Function Tests: Recent Labs  Lab 08/23/23 1838 08/24/23 0447 08/26/23 0520 08/29/23 1527 08/30/23 0552  AST 25 17 14* 20 17  ALT 57* 45* 33 34 29  ALKPHOS 53 46 47 58 49  BILITOT 1.2 1.1 0.8 1.2 0.9  PROT 7.2 6.3* 6.4* 7.7 6.6  ALBUMIN 4.0 3.7 3.8 4.6 3.9   CBG: No results for input(s): GLUCAP in the last 168 hours.  Discharge time spent: greater than 30 minutes.  Signed: Kirstin Kugler, DO [facility] 08/30/2023

## 2023-08-31 ENCOUNTER — Other Ambulatory Visit (HOSPITAL_COMMUNITY): Payer: Self-pay

## 2023-08-31 ENCOUNTER — Ambulatory Visit: Admitting: Podiatry

## 2023-08-31 DIAGNOSIS — M7751 Other enthesopathy of right foot: Secondary | ICD-10-CM

## 2023-08-31 MED ORDER — BETAMETHASONE SOD PHOS & ACET 6 (3-3) MG/ML IJ SUSP
3.0000 mg | Freq: Once | INTRAMUSCULAR | Status: AC
  Administered 2023-08-31: 3 mg via INTRA_ARTICULAR

## 2023-08-31 NOTE — Progress Notes (Signed)
   Chief Complaint  Patient presents with   Follow-up    Right foot. Pain in the top of right foot is same and unchanged from last visit. Discharged from hospital yesterday after severe GI issues caused by ibuprofen .     Subjective:  Patient presents today status post EPF RT foot.  DOS: 07/02/2023.  Recently discharged from the hospital for GI issues caused by excessive ibuprofen .  His heel has no pain but he continues to have pain and tenderness to the dorsum of the right foot.  Past Medical History:  Diagnosis Date   ADHD    Whooping cough     Past Surgical History:  Procedure Laterality Date   ESOPHAGOGASTRODUODENOSCOPY N/A 08/25/2023   Procedure: EGD (ESOPHAGOGASTRODUODENOSCOPY);  Surgeon: Kriss Estefana DEL, DO;  Location: [facility];  Service: Gastroenterology;  Laterality: N/A;   FOOT SURGERY  06/2023    Allergies  Allergen Reactions   Ibuprofen  Other (See Comments)    Caused ulcers     RT foot 07/29/2023  Objective/Physical Exam Neurovascular status intact.  The pustular lesions to the foot are completely resolved.  No open wounds noted  Continued exquisite tenderness to palpation throughout the dorsum of the midfoot around the proximal aspect of the second metatarsal base.  No appreciable edema today.  With palpation to the area there is some shooting sensation that goes down to the toes, possible neuritis  Radiographic Exam RT foot 08/25/2023:  Unchanged.  Normal osseous mineralization.  Impression: Negative for stress fracture  Assessment: 1. s/p EPF RT foot. DOS: 07/02/2023 2.  Pustular atopic dermatitis right foot; resolved 3.  Suspicion for stress fracture versus neuritis right dorsal midfoot  Plan of Care:  -Patient was evaluated.   -Injection of 0.5 cc Celestone  Soluspan injected along the dorsum of the right foot -Recommend shoes that do not constrict or irritate the dorsum of the foot -Continue compression sock -Return to clinic 4 weeks  Thresa EMERSON Sar,  DPM [facility]  Dr. Thresa EMERSON Sar, DPM    [address] [place], [place] [address]                Office [phone]  Fax [phone]

## 2023-09-02 ENCOUNTER — Encounter: Payer: Self-pay | Admitting: Podiatry

## 2023-09-03 ENCOUNTER — Encounter: Payer: Self-pay | Admitting: Podiatry

## 2023-09-03 NOTE — Telephone Encounter (Signed)
 Pt sent me an email to adv [facility] needed letter about extension. I faxed [phone] and emailed him copy of letter about new RTW approx 09/14/23.

## 2023-09-14 ENCOUNTER — Encounter: Payer: Self-pay | Admitting: Podiatry

## 2023-09-14 NOTE — Telephone Encounter (Signed)
 Per pt request via email, he needs letter to advise he is ok to RTW 09/14/23- full duty. I emailed the letter to the patient to email address on file.

## 2023-10-05 ENCOUNTER — Ambulatory Visit: Admitting: Podiatry

## 2024-02-09 ENCOUNTER — Emergency Department (HOSPITAL_BASED_OUTPATIENT_CLINIC_OR_DEPARTMENT_OTHER)

## 2024-02-09 ENCOUNTER — Other Ambulatory Visit: Payer: Self-pay

## 2024-02-09 ENCOUNTER — Inpatient Hospital Stay (HOSPITAL_BASED_OUTPATIENT_CLINIC_OR_DEPARTMENT_OTHER)
Admission: EM | Admit: 2024-02-09 | Discharge: 2024-02-12 | DRG: 381 | Disposition: A | Discharge status: Home / Self Care | Attending: Internal Medicine | Admitting: Internal Medicine

## 2024-02-09 ENCOUNTER — Emergency Department (HOSPITAL_COMMUNITY)
Admission: EM | Admit: 2024-02-09 | Discharge: 2024-02-09 | Disposition: A | Discharge status: Home / Self Care | Source: Ambulatory Visit | Attending: Emergency Medicine | Admitting: Emergency Medicine

## 2024-02-09 ENCOUNTER — Emergency Department (HOSPITAL_BASED_OUTPATIENT_CLINIC_OR_DEPARTMENT_OTHER): Admitting: Radiology

## 2024-02-09 ENCOUNTER — Encounter (HOSPITAL_BASED_OUTPATIENT_CLINIC_OR_DEPARTMENT_OTHER): Payer: Self-pay

## 2024-02-09 DIAGNOSIS — R1116 Cannabis hyperemesis syndrome: Secondary | ICD-10-CM | POA: Diagnosis present

## 2024-02-09 DIAGNOSIS — E860 Dehydration: Secondary | ICD-10-CM | POA: Diagnosis present

## 2024-02-09 DIAGNOSIS — R197 Diarrhea, unspecified: Secondary | ICD-10-CM | POA: Insufficient documentation

## 2024-02-09 DIAGNOSIS — R1030 Lower abdominal pain, unspecified: Secondary | ICD-10-CM | POA: Diagnosis present

## 2024-02-09 DIAGNOSIS — F17290 Nicotine dependence, other tobacco product, uncomplicated: Secondary | ICD-10-CM | POA: Diagnosis present

## 2024-02-09 DIAGNOSIS — Z8711 Personal history of peptic ulcer disease: Secondary | ICD-10-CM

## 2024-02-09 DIAGNOSIS — K561 Intussusception: Secondary | ICD-10-CM | POA: Diagnosis present

## 2024-02-09 DIAGNOSIS — F1210 Cannabis abuse, uncomplicated: Secondary | ICD-10-CM | POA: Diagnosis present

## 2024-02-09 DIAGNOSIS — Z5321 Procedure and treatment not carried out due to patient leaving prior to being seen by health care provider: Secondary | ICD-10-CM | POA: Diagnosis not present

## 2024-02-09 DIAGNOSIS — K279 Peptic ulcer, site unspecified, unspecified as acute or chronic, without hemorrhage or perforation: Secondary | ICD-10-CM | POA: Diagnosis present

## 2024-02-09 DIAGNOSIS — K2970 Gastritis, unspecified, without bleeding: Secondary | ICD-10-CM | POA: Diagnosis present

## 2024-02-09 DIAGNOSIS — R053 Chronic cough: Secondary | ICD-10-CM | POA: Diagnosis present

## 2024-02-09 DIAGNOSIS — D72829 Elevated white blood cell count, unspecified: Secondary | ICD-10-CM | POA: Diagnosis present

## 2024-02-09 DIAGNOSIS — K219 Gastro-esophageal reflux disease without esophagitis: Secondary | ICD-10-CM | POA: Diagnosis present

## 2024-02-09 DIAGNOSIS — K920 Hematemesis: Principal | ICD-10-CM | POA: Diagnosis present

## 2024-02-09 DIAGNOSIS — R1110 Vomiting, unspecified: Secondary | ICD-10-CM | POA: Insufficient documentation

## 2024-02-09 DIAGNOSIS — F909 Attention-deficit hyperactivity disorder, unspecified type: Secondary | ICD-10-CM | POA: Diagnosis present

## 2024-02-09 DIAGNOSIS — K2211 Ulcer of esophagus with bleeding: Principal | ICD-10-CM | POA: Diagnosis present

## 2024-02-09 DIAGNOSIS — R300 Dysuria: Secondary | ICD-10-CM | POA: Diagnosis present

## 2024-02-09 DIAGNOSIS — E876 Hypokalemia: Secondary | ICD-10-CM | POA: Diagnosis not present

## 2024-02-09 DIAGNOSIS — R000 Tachycardia, unspecified: Secondary | ICD-10-CM | POA: Diagnosis present

## 2024-02-09 DIAGNOSIS — R109 Unspecified abdominal pain: Secondary | ICD-10-CM | POA: Diagnosis present

## 2024-02-09 DIAGNOSIS — Z79899 Other long term (current) drug therapy: Secondary | ICD-10-CM

## 2024-02-09 DIAGNOSIS — K3189 Other diseases of stomach and duodenum: Secondary | ICD-10-CM | POA: Diagnosis present

## 2024-02-09 DIAGNOSIS — R112 Nausea with vomiting, unspecified: Secondary | ICD-10-CM | POA: Diagnosis present

## 2024-02-09 DIAGNOSIS — K921 Melena: Secondary | ICD-10-CM | POA: Diagnosis present

## 2024-02-09 DIAGNOSIS — Z8249 Family history of ischemic heart disease and other diseases of the circulatory system: Secondary | ICD-10-CM

## 2024-02-09 DIAGNOSIS — K2289 Other specified disease of esophagus: Secondary | ICD-10-CM | POA: Diagnosis present

## 2024-02-09 DIAGNOSIS — K229 Disease of esophagus, unspecified: Secondary | ICD-10-CM

## 2024-02-09 HISTORY — DX: Gastric ulcer, unspecified as acute or chronic, without hemorrhage or perforation: K25.9

## 2024-02-09 LAB — TROPONIN T, HIGH SENSITIVITY
Troponin T High Sensitivity: <15 ng/L (ref 0–19)
Troponin T High Sensitivity: <15 ng/L (ref 0–19)

## 2024-02-09 LAB — URINALYSIS, ROUTINE W REFLEX MICROSCOPIC
Bacteria, UA: NONE SEEN
Bilirubin Urine: NEGATIVE
Glucose, UA: NEGATIVE mg/dL
Hgb urine dipstick: NEGATIVE
Leukocytes,Ua: NEGATIVE
Nitrite: NEGATIVE
Protein, ur: 30 mg/dL — AB
Specific Gravity, Urine: >1.046 — ABNORMAL HIGH (ref 1.005–1.030)
pH: 6.5 (ref 5.0–8.0)

## 2024-02-09 LAB — COMPREHENSIVE METABOLIC PANEL WITH GFR
ALT: 26 U/L (ref 0–44)
AST: 21 U/L (ref 15–41)
Albumin: 5.3 g/dL — ABNORMAL HIGH (ref 3.5–5.0)
Alkaline Phosphatase: 81 U/L (ref 38–126)
Anion gap: 17 — ABNORMAL HIGH (ref 5–15)
BUN: 24 mg/dL — ABNORMAL HIGH (ref 6–20)
CO2: 29 mmol/L (ref 22–32)
Calcium: 10.9 mg/dL — ABNORMAL HIGH (ref 8.9–10.3)
Chloride: 94 mmol/L — ABNORMAL LOW (ref 98–111)
Creatinine, Ser: 1.02 mg/dL (ref 0.61–1.24)
GFR, Estimated: >60 mL/min
Glucose, Bld: 110 mg/dL — ABNORMAL HIGH (ref 70–99)
Potassium: 3.1 mmol/L — ABNORMAL LOW (ref 3.5–5.1)
Sodium: 140 mmol/L (ref 135–145)
Total Bilirubin: 1.4 mg/dL — ABNORMAL HIGH (ref 0.0–1.2)
Total Protein: 9.3 g/dL — ABNORMAL HIGH (ref 6.5–8.1)

## 2024-02-09 LAB — CBC
HCT: 54 % — ABNORMAL HIGH (ref 39.0–52.0)
Hemoglobin: 19.2 g/dL — ABNORMAL HIGH (ref 13.0–17.0)
MCH: 31 pg (ref 26.0–34.0)
MCHC: 35.6 g/dL (ref 30.0–36.0)
MCV: 87.2 fL (ref 80.0–100.0)
Platelets: 375 K/uL (ref 150–400)
RBC: 6.19 MIL/uL — ABNORMAL HIGH (ref 4.22–5.81)
RDW: 13.2 % (ref 11.5–15.5)
WBC: 13.7 K/uL — ABNORMAL HIGH (ref 4.0–10.5)
nRBC: 0 % (ref 0.0–0.2)

## 2024-02-09 LAB — URINE DRUG SCREEN
Amphetamines: NEGATIVE
Barbiturates: NEGATIVE
Benzodiazepines: NEGATIVE
Cocaine: NEGATIVE
Fentanyl: NEGATIVE
Methadone Scn, Ur: NEGATIVE
Opiates: NEGATIVE
Tetrahydrocannabinol: POSITIVE — AB

## 2024-02-09 LAB — OCCULT BLOOD X 1 CARD TO LAB, STOOL: Fecal Occult Bld: NEGATIVE

## 2024-02-09 LAB — LIPASE, BLOOD: Lipase: 39 U/L (ref 11–51)

## 2024-02-09 MED ORDER — FAMOTIDINE IN NACL 20-0.9 MG/50ML-% IV SOLN
20.0000 mg | Freq: Once | INTRAVENOUS | Status: AC
  Administered 2024-02-09: 20 mg via INTRAVENOUS
  Filled 2024-02-09: qty 50

## 2024-02-09 MED ORDER — LACTATED RINGERS IV BOLUS
1000.0000 mL | Freq: Once | INTRAVENOUS | Status: AC
  Administered 2024-02-09: 1000 mL via INTRAVENOUS

## 2024-02-09 MED ORDER — FENTANYL CITRATE (PF) 50 MCG/ML IJ SOSY
50.0000 ug | PREFILLED_SYRINGE | Freq: Once | INTRAMUSCULAR | Status: AC
  Administered 2024-02-09: 50 ug via INTRAVENOUS
  Filled 2024-02-09: qty 1

## 2024-02-09 MED ORDER — ONDANSETRON HCL 4 MG/2ML IJ SOLN
4.0000 mg | Freq: Once | INTRAMUSCULAR | Status: AC
  Administered 2024-02-09: 4 mg via INTRAVENOUS
  Filled 2024-02-09: qty 2

## 2024-02-09 MED ORDER — IOHEXOL 300 MG/ML  SOLN
100.0000 mL | Freq: Once | INTRAMUSCULAR | Status: AC | PRN
  Administered 2024-02-09: 100 mL via INTRAVENOUS

## 2024-02-09 MED ORDER — PANTOPRAZOLE SODIUM 40 MG IV SOLR
40.0000 mg | Freq: Once | INTRAVENOUS | Status: AC
  Administered 2024-02-09: 40 mg via INTRAVENOUS
  Filled 2024-02-09: qty 10

## 2024-02-09 NOTE — ED Provider Notes (Incomplete)
 " [place] [facility]GENC[facility] DEPARTMENT AT [facility] Provider Note   CSN: [id number] Arrival date & time: 02/09/24  2107     Patient presents with: Chest Pain and Abdominal Pain   [name] is a 34 y.o. male.  {Add pertinent medical, surgical, social history, OB history to HPI:32947}  Chest Pain Associated symptoms: abdominal pain   Abdominal Pain Associated symptoms: chest pain      34 year old male with medical history significant for multiple gastric ulcers, recent admission for the same in August 2025, did not follow-up with gastroenterology who presents to the emergency department with several days of uncontrolled nausea, vomiting.  The patient endorses dark sticky stools.  He also endorses coffee-ground emesis.  He states that he has cut back on NSAID use.  He stopped taking his outpatient PPI because his symptoms had improved.  He denies any bright red hematemesis.  Prior to Admission medications  Medication Sig Start Date End Date Taking? Authorizing Provider  acetaminophen  (TYLENOL ) 500 MG tablet Take 2 tablets (1,000 mg total) by mouth every 6 (six) hours. 08/30/23   Swayze, Ava, DO  amphetamine -dextroamphetamine  (ADDERALL) 30 MG tablet Take 30 mg by mouth 2 (two) times daily. 07/20/23   [provider]  pantoprazole  (PROTONIX ) 40 MG tablet Take 1 tablet (40 mg total) by mouth 2 (two) times daily. 08/26/23 11/24/23  Laurence Locus, DO  sucralfate  (CARAFATE ) 1 g tablet Take 1 tablet (1 g total) by mouth 4 (four) times daily -  with meals and at bedtime. 08/26/23 11/24/23  Laurence Locus, DO  traMADol  (ULTRAM ) 50 MG tablet Take 1 tablet (50 mg total) by mouth every 6 (six) hours as needed for moderate pain (pain score 4-6). 08/30/23   Swayze, Ava, DO    Allergies: Ibuprofen     Review of Systems  Cardiovascular:  Positive for chest pain.  Gastrointestinal:  Positive for abdominal pain.    Updated Vital Signs BP (!) 127/90   Pulse 70   Temp 98.9 F (37.2 C)  (Temporal)   Resp 20   Ht 6' 3 (1.905 m)   Wt 113.4 kg   SpO2 93%   BMI 31.25 kg/m   Physical Exam  (all labs ordered are listed, but only abnormal results are displayed) Labs Reviewed  URINALYSIS, ROUTINE W REFLEX MICROSCOPIC  URINE DRUG SCREEN  OCCULT BLOOD X 1 CARD TO LAB, STOOL  TROPONIN T, HIGH SENSITIVITY  TROPONIN T, HIGH SENSITIVITY    EKG: EKG Interpretation Date/Time:  Tuesday February 09 2024 21:20:55 EST Ventricular Rate:  119 PR Interval:  146 QRS Duration:  90 QT Interval:  324 QTC Calculation: 455 R Axis:   82  Text Interpretation: Sinus tachycardia Right atrial enlargement Borderline ECG When compared with ECG of 30-Aug-2023 00:08, PREVIOUS ECG IS PRESENT Confirmed by Jerrol Agent ([id number]) on 02/09/2024 10:14:08 PM  Radiology: CT ABDOMEN PELVIS W CONTRAST Result Date: 02/09/2024 CLINICAL DATA:  History of gastric ulcers nausea vomiting EXAM: CT ABDOMEN AND PELVIS WITH CONTRAST TECHNIQUE: Multidetector CT imaging of the abdomen and pelvis was performed using the standard protocol following bolus administration of intravenous contrast. RADIATION DOSE REDUCTION: This exam was performed according to the departmental dose-optimization program which includes automated exposure control, adjustment of the mA and/or kV according to patient size and/or use of iterative reconstruction technique. CONTRAST:  OMNIPAQUE  IOHEXOL  300 MG/ML  SOLN COMPARISON:  CT 08/29/2023, 08/23/2023, 10/25/2017 FINDINGS: Lower chest: Lung bases demonstrate no acute airspace disease. Hepatobiliary: No focal liver abnormality is seen.  No gallstones, gallbladder wall thickening, or biliary dilatation. Pancreas: Unremarkable. No pancreatic ductal dilatation or surrounding inflammatory changes. Spleen: Normal in size without focal abnormality. Adrenals/Urinary Tract: Adrenal glands are unremarkable. Kidneys are normal, without renal calculi, focal lesion, or hydronephrosis. Bladder is unremarkable.  Stomach/Bowel: The stomach is within normal limits. Small bowel intussusception in the left upper quadrant, series 2, image 25 through 32, coronal series 4 image 60. No obstruction. No acute bowel wall thickening. Negative appendix. Vascular/Lymphatic: No significant vascular findings are present. No enlarged abdominal or pelvic lymph nodes. Reproductive: Prostate is unremarkable. Other: No ascites or free air Musculoskeletal: No acute osseous abnormality IMPRESSION: 1. Small bowel intussusception in the left upper quadrant without evidence for obstruction or obvious mass. These are typically transient/self limiting 2. Otherwise no CT evidence for acute intra-abdominal or pelvic abnormality. Electronically Signed   By: Luke Bun M.D.   On: 02/09/2024 22:52   DG Chest 2 View Result Date: 02/09/2024 EXAM: 2 VIEW(S) XRAY OF THE CHEST 02/09/2024 10:39:00 PM COMPARISON: 09/18/2017 CLINICAL HISTORY: The patient presents with chest pain. FINDINGS: LUNGS AND PLEURA: No focal pulmonary opacity. No pleural effusion. No pneumothorax. HEART AND MEDIASTINUM: No acute abnormality of the cardiac and mediastinal silhouettes. BONES AND SOFT TISSUES: No acute osseous abnormality. IMPRESSION: 1. No acute cardiopulmonary pathology. Electronically signed by: Morgane Naveau MD 02/09/2024 10:48 PM EST RP Workstation: [id number]    {Document cardiac monitor, telemetry assessment procedure when appropriate:32947} Procedures   Medications Ordered in the ED  famotidine  (PEPCID ) IVPB 20 mg premix (20 mg Intravenous New Bag/Given 02/09/24 2244)  lactated ringers  bolus 1,000 mL (1,000 mLs Intravenous New Bag/Given 02/09/24 2221)  ondansetron  (ZOFRAN ) injection 4 mg (4 mg Intravenous Given 02/09/24 2223)  iohexol  (OMNIPAQUE ) 300 MG/ML solution 100 mL (100 mLs Intravenous Contrast Given 02/09/24 2234)      {Click here for ABCD2, HEART and other calculators REFRESH Note before signing:1}                              Medical  Decision Making Amount and/or Complexity of Data Reviewed Labs: ordered. Radiology: ordered.  Risk Prescription drug management.   ***  {Document critical care time when appropriate  Document review of labs and clinical decision tools ie CHADS2VASC2, etc  Document your independent review of radiology images and any outside records  Document your discussion with family members, caretakers and with consultants  Document social determinants of health affecting pt's care  Document your decision making why or why not admission, treatments were needed:32947:::1}   Final diagnoses:  None    ED Discharge Orders     None        "

## 2024-02-09 NOTE — ED Notes (Signed)
 Last call with no answer.

## 2024-02-09 NOTE — ED Notes (Signed)
 Pt unable to urinate. When he tried to use urinal at bedside, got severe cramp in left hand.

## 2024-02-09 NOTE — ED Notes (Signed)
 Patient transported to CT

## 2024-02-09 NOTE — ED Notes (Signed)
 Called x 2 to update vitals and no answer.

## 2024-02-09 NOTE — ED Triage Notes (Signed)
 PT ambulatory to triage with complaints of vomiting that began on Friday night and has persisted. Pt states that he contacted his PCP, and was directed to the ER. Pt reports 2 episodes of diarrhea. Endorses a hx of ulcer.

## 2024-02-09 NOTE — ED Provider Notes (Signed)
 " [facility] AT [facility] Provider Note   CSN: [id number] Arrival date & time: 02/09/24  2107     Patient presents with: Chest Pain and Abdominal Pain   [name] is a 34 y.o. male.  {Add pertinent medical, surgical, social history, OB history to HPI:32947}  Chest Pain Associated symptoms: abdominal pain   Abdominal Pain Associated symptoms: chest pain        Prior to Admission medications  Medication Sig Start Date End Date Taking? Authorizing Provider  acetaminophen  (TYLENOL ) 500 MG tablet Take 2 tablets (1,000 mg total) by mouth every 6 (six) hours. 08/30/23   Swayze, Ava, DO  amphetamine -dextroamphetamine  (ADDERALL) 30 MG tablet Take 30 mg by mouth 2 (two) times daily. 07/20/23   [provider]  pantoprazole  (PROTONIX ) 40 MG tablet Take 1 tablet (40 mg total) by mouth 2 (two) times daily. 08/26/23 11/24/23  Laurence Locus, DO  sucralfate  (CARAFATE ) 1 g tablet Take 1 tablet (1 g total) by mouth 4 (four) times daily -  with meals and at bedtime. 08/26/23 11/24/23  Laurence Locus, DO  traMADol  (ULTRAM ) 50 MG tablet Take 1 tablet (50 mg total) by mouth every 6 (six) hours as needed for moderate pain (pain score 4-6). 08/30/23   Swayze, Ava, DO    Allergies: Ibuprofen     Review of Systems  Cardiovascular:  Positive for chest pain.  Gastrointestinal:  Positive for abdominal pain.    Updated Vital Signs BP (!) 127/90   Pulse 70   Temp 98.9 F (37.2 C) (Temporal)   Resp 20   Ht 6' 3 (1.905 m)   Wt 113.4 kg   SpO2 93%   BMI 31.25 kg/m   Physical Exam  (all labs ordered are listed, but only abnormal results are displayed) Labs Reviewed  URINALYSIS, ROUTINE W REFLEX MICROSCOPIC  URINE DRUG SCREEN  OCCULT BLOOD X 1 CARD TO LAB, STOOL  TROPONIN T, HIGH SENSITIVITY  TROPONIN T, HIGH SENSITIVITY    EKG: EKG Interpretation Date/Time:  Tuesday February 09 2024 21:20:55 EST Ventricular Rate:  119 PR Interval:  146 QRS  Duration:  90 QT Interval:  324 QTC Calculation: 455 R Axis:   82  Text Interpretation: Sinus tachycardia Right atrial enlargement Borderline ECG When compared with ECG of 30-Aug-2023 00:08, PREVIOUS ECG IS PRESENT Confirmed by Jerrol Agent ([id number]) on 02/09/2024 10:14:08 PM  Radiology: CT ABDOMEN PELVIS W CONTRAST Result Date: 02/09/2024 CLINICAL DATA:  History of gastric ulcers nausea vomiting EXAM: CT ABDOMEN AND PELVIS WITH CONTRAST TECHNIQUE: Multidetector CT imaging of the abdomen and pelvis was performed using the standard protocol following bolus administration of intravenous contrast. RADIATION DOSE REDUCTION: This exam was performed according to the departmental dose-optimization program which includes automated exposure control, adjustment of the mA and/or kV according to patient size and/or use of iterative reconstruction technique. CONTRAST:  OMNIPAQUE  IOHEXOL  300 MG/ML  SOLN COMPARISON:  CT 08/29/2023, 08/23/2023, 10/25/2017 FINDINGS: Lower chest: Lung bases demonstrate no acute airspace disease. Hepatobiliary: No focal liver abnormality is seen. No gallstones, gallbladder wall thickening, or biliary dilatation. Pancreas: Unremarkable. No pancreatic ductal dilatation or surrounding inflammatory changes. Spleen: Normal in size without focal abnormality. Adrenals/Urinary Tract: Adrenal glands are unremarkable. Kidneys are normal, without renal calculi, focal lesion, or hydronephrosis. Bladder is unremarkable. Stomach/Bowel: The stomach is within normal limits. Small bowel intussusception in the left upper quadrant, series 2, image 25 through 32, coronal series 4 image 60. No obstruction. No acute bowel wall thickening. Negative  appendix. Vascular/Lymphatic: No significant vascular findings are present. No enlarged abdominal or pelvic lymph nodes. Reproductive: Prostate is unremarkable. Other: No ascites or free air Musculoskeletal: No acute osseous abnormality IMPRESSION: 1. Small bowel  intussusception in the left upper quadrant without evidence for obstruction or obvious mass. These are typically transient/self limiting 2. Otherwise no CT evidence for acute intra-abdominal or pelvic abnormality. Electronically Signed   By: Luke Bun M.D.   On: 02/09/2024 22:52   DG Chest 2 View Result Date: 02/09/2024 EXAM: 2 VIEW(S) XRAY OF THE CHEST 02/09/2024 10:39:00 PM COMPARISON: 09/18/2017 CLINICAL HISTORY: The patient presents with chest pain. FINDINGS: LUNGS AND PLEURA: No focal pulmonary opacity. No pleural effusion. No pneumothorax. HEART AND MEDIASTINUM: No acute abnormality of the cardiac and mediastinal silhouettes. BONES AND SOFT TISSUES: No acute osseous abnormality. IMPRESSION: 1. No acute cardiopulmonary pathology. Electronically signed by: Morgane Naveau MD 02/09/2024 10:48 PM EST RP Workstation: [id number]    {Document cardiac monitor, telemetry assessment procedure when appropriate:32947} Procedures   Medications Ordered in the ED  famotidine  (PEPCID ) IVPB 20 mg premix (20 mg Intravenous New Bag/Given 02/09/24 2244)  lactated ringers  bolus 1,000 mL (1,000 mLs Intravenous New Bag/Given 02/09/24 2221)  ondansetron  (ZOFRAN ) injection 4 mg (4 mg Intravenous Given 02/09/24 2223)  iohexol  (OMNIPAQUE ) 300 MG/ML solution 100 mL (100 mLs Intravenous Contrast Given 02/09/24 2234)      {Click here for ABCD2, HEART and other calculators REFRESH Note before signing:1}                              Medical Decision Making Amount and/or Complexity of Data Reviewed Labs: ordered. Radiology: ordered.  Risk Prescription drug management.   ***  {Document critical care time when appropriate  Document review of labs and clinical decision tools ie CHADS2VASC2, etc  Document your independent review of radiology images and any outside records  Document your discussion with family members, caretakers and with consultants  Document social determinants of health affecting pt's care   Document your decision making why or why not admission, treatments were needed:32947:::1}   Final diagnoses:  None    ED Discharge Orders     None        "

## 2024-02-09 NOTE — ED Triage Notes (Addendum)
 Chest tightness, n/v can not keep anything down, SHOB since Friday. Left [facility] today after waiting 8 hrs Hx of gastric ulcers and reports blk tarry stools Labs have resulted Will add trop & urine & EKG

## 2024-02-10 ENCOUNTER — Encounter (HOSPITAL_BASED_OUTPATIENT_CLINIC_OR_DEPARTMENT_OTHER): Payer: Self-pay | Admitting: Family Medicine

## 2024-02-10 DIAGNOSIS — R300 Dysuria: Secondary | ICD-10-CM | POA: Insufficient documentation

## 2024-02-10 DIAGNOSIS — K561 Intussusception: Secondary | ICD-10-CM | POA: Insufficient documentation

## 2024-02-10 DIAGNOSIS — K920 Hematemesis: Principal | ICD-10-CM

## 2024-02-10 DIAGNOSIS — R109 Unspecified abdominal pain: Secondary | ICD-10-CM | POA: Diagnosis present

## 2024-02-10 DIAGNOSIS — D72829 Elevated white blood cell count, unspecified: Secondary | ICD-10-CM | POA: Insufficient documentation

## 2024-02-10 LAB — BASIC METABOLIC PANEL WITH GFR
Anion gap: 12 (ref 5–15)
BUN: 23 mg/dL — ABNORMAL HIGH (ref 6–20)
CO2: 31 mmol/L (ref 22–32)
Calcium: 9.8 mg/dL (ref 8.9–10.3)
Chloride: 96 mmol/L — ABNORMAL LOW (ref 98–111)
Creatinine, Ser: 0.97 mg/dL (ref 0.61–1.24)
GFR, Estimated: >60 mL/min
Glucose, Bld: 102 mg/dL — ABNORMAL HIGH (ref 70–99)
Potassium: 2.8 mmol/L — ABNORMAL LOW (ref 3.5–5.1)
Sodium: 139 mmol/L (ref 135–145)

## 2024-02-10 LAB — CBC WITH DIFFERENTIAL/PLATELET
Abs Immature Granulocytes: 0.04 K/uL (ref 0.00–0.07)
Basophils Absolute: 0 K/uL (ref 0.0–0.1)
Basophils Relative: 0 %
Eosinophils Absolute: 0.1 K/uL (ref 0.0–0.5)
Eosinophils Relative: 1 %
HCT: 49 % (ref 39.0–52.0)
Hemoglobin: 17.1 g/dL — ABNORMAL HIGH (ref 13.0–17.0)
Immature Granulocytes: 0 %
Lymphocytes Relative: 30 %
Lymphs Abs: 3.5 K/uL (ref 0.7–4.0)
MCH: 30.7 pg (ref 26.0–34.0)
MCHC: 34.9 g/dL (ref 30.0–36.0)
MCV: 88 fL (ref 80.0–100.0)
Monocytes Absolute: 1 K/uL (ref 0.1–1.0)
Monocytes Relative: 9 %
Neutro Abs: 7.1 K/uL (ref 1.7–7.7)
Neutrophils Relative %: 60 %
Platelets: 277 K/uL (ref 150–400)
RBC: 5.57 MIL/uL (ref 4.22–5.81)
RDW: 12.9 % (ref 11.5–15.5)
WBC: 11.7 K/uL — ABNORMAL HIGH (ref 4.0–10.5)
nRBC: 0 % (ref 0.0–0.2)

## 2024-02-10 LAB — MAGNESIUM: Magnesium: 2.6 mg/dL — ABNORMAL HIGH (ref 1.7–2.4)

## 2024-02-10 MED ORDER — FENTANYL CITRATE (PF) 50 MCG/ML IJ SOSY
50.0000 ug | PREFILLED_SYRINGE | Freq: Once | INTRAMUSCULAR | Status: AC
  Administered 2024-02-10: 50 ug via INTRAVENOUS
  Filled 2024-02-10: qty 1

## 2024-02-10 MED ORDER — POTASSIUM CHLORIDE 10 MEQ/100ML IV SOLN
10.0000 meq | INTRAVENOUS | Status: AC
  Administered 2024-02-10 (×3): 10 meq via INTRAVENOUS
  Filled 2024-02-10 (×4): qty 100

## 2024-02-10 MED ORDER — ACETAMINOPHEN 325 MG PO TABS: 650.0000 mg | ORAL_TABLET | Freq: Four times a day (QID) | ORAL | Status: DC | PRN

## 2024-02-10 MED ORDER — PANTOPRAZOLE SODIUM 40 MG IV SOLR
40.0000 mg | Freq: Two times a day (BID) | INTRAVENOUS | Status: DC
  Administered 2024-02-10 – 2024-02-12 (×4): 40 mg via INTRAVENOUS
  Filled 2024-02-10 (×4): qty 10

## 2024-02-10 MED ORDER — SUCRALFATE 1 G PO TABS
1.0000 g | ORAL_TABLET | Freq: Three times a day (TID) | ORAL | Status: DC
  Administered 2024-02-10 – 2024-02-12 (×5): 1 g via ORAL
  Filled 2024-02-10 (×5): qty 1

## 2024-02-10 MED ORDER — ACETAMINOPHEN 650 MG RE SUPP: 650.0000 mg | Freq: Four times a day (QID) | RECTAL | Status: DC | PRN

## 2024-02-10 MED ORDER — ONDANSETRON HCL 4 MG/2ML IJ SOLN
4.0000 mg | INTRAMUSCULAR | Status: DC | PRN
  Administered 2024-02-10 (×2): 4 mg via INTRAVENOUS
  Filled 2024-02-10 (×2): qty 2

## 2024-02-10 MED ORDER — ONDANSETRON HCL 4 MG/2ML IJ SOLN
4.0000 mg | Freq: Once | INTRAMUSCULAR | Status: AC
  Administered 2024-02-10: 4 mg via INTRAVENOUS
  Filled 2024-02-10: qty 2

## 2024-02-10 MED ORDER — LACTATED RINGERS IV SOLN: INTRAVENOUS | Status: DC

## 2024-02-10 MED ORDER — ONDANSETRON HCL 4 MG/2ML IJ SOLN
4.0000 mg | INTRAMUSCULAR | Status: DC | PRN
  Administered 2024-02-10 – 2024-02-11 (×4): 4 mg via INTRAVENOUS
  Filled 2024-02-10 (×4): qty 2

## 2024-02-10 MED ORDER — FENTANYL CITRATE (PF) 50 MCG/ML IJ SOSY
25.0000 ug | PREFILLED_SYRINGE | INTRAMUSCULAR | Status: DC | PRN
  Administered 2024-02-10 – 2024-02-11 (×4): 25 ug via INTRAVENOUS
  Filled 2024-02-10 (×4): qty 1

## 2024-02-10 MED ORDER — AMPHETAMINE-DEXTROAMPHETAMINE 10 MG PO TABS
30.0000 mg | ORAL_TABLET | Freq: Every day | ORAL | Status: DC
  Filled 2024-02-10: qty 3

## 2024-02-10 MED ORDER — MORPHINE SULFATE (PF) 2 MG/ML IV SOLN
2.0000 mg | INTRAVENOUS | Status: AC | PRN
  Administered 2024-02-10: 2 mg via INTRAVENOUS
  Filled 2024-02-10: qty 1

## 2024-02-10 MED ORDER — SODIUM CHLORIDE 0.9 % IV SOLN: 12.5000 mg | Freq: Four times a day (QID) | INTRAVENOUS | Status: DC | PRN

## 2024-02-10 MED ORDER — TRAZODONE HCL 50 MG PO TABS: 50.0000 mg | ORAL_TABLET | Freq: Every evening | ORAL | Status: DC | PRN

## 2024-02-10 MED ORDER — AMPHETAMINE-DEXTROAMPHETAMINE 30 MG PO TABS: 30.0000 mg | ORAL_TABLET | Freq: Two times a day (BID) | ORAL | Status: DC

## 2024-02-10 MED ORDER — PROMETHAZINE HCL 25 MG/ML IJ SOLN
INTRAMUSCULAR | Status: AC
  Filled 2024-02-10: qty 1

## 2024-02-10 MED ORDER — DIPHENHYDRAMINE HCL 50 MG/ML IJ SOLN
12.5000 mg | Freq: Once | INTRAMUSCULAR | Status: AC
  Administered 2024-02-10: 12.5 mg via INTRAVENOUS
  Filled 2024-02-10: qty 1

## 2024-02-10 MED ORDER — POTASSIUM CHLORIDE CRYS ER 10 MEQ PO TBCR: 40.0000 meq | EXTENDED_RELEASE_TABLET | Freq: Once | ORAL | Status: DC

## 2024-02-10 MED ORDER — HYDROMORPHONE HCL 1 MG/ML IJ SOLN
1.0000 mg | INTRAMUSCULAR | Status: AC | PRN
  Administered 2024-02-10 (×3): 1 mg via INTRAVENOUS
  Filled 2024-02-10 (×3): qty 1

## 2024-02-10 MED ORDER — SODIUM CHLORIDE 0.9 % IV SOLN
12.5000 mg | Freq: Once | INTRAVENOUS | Status: AC
  Administered 2024-02-10: 12.5 mg via INTRAVENOUS
  Filled 2024-02-10: qty 0.5

## 2024-02-10 NOTE — Progress Notes (Signed)
 Plan of Care Note for accepted transfer   Patient: [name] MRN: [id number]   DOA: 02/09/2024  Facility requesting transfer: [facility]   Requesting Provider: Dr. Jerrol   Reason for transfer: Abdominal pain, N/V, coffee ground emesis   Facility course: 34 yr old man with hx of PUD presents with epigastric pain and N/V, reporting coffee-ground emesis and dark stools.   He was tachycardic with stable BP. Notable labs include BUN 24, SCr 1.02, normal lipase, WBC 13.7, Hgb 19.2, and negative FOBT. CT abdomen/pelvis reveals small bowel intussusception.   He was given IVF, IV PPI, Zofran , and fentanyl . ED physician is consulting GI and spoke with surgery who felt the intussusception should be self-limited.   Plan of care: The patient is accepted for admission to Telemetry unit, at [facility]   Author: Evalene GORMAN Sprinkles, MD 02/10/2024  Check [email or web address] for on-call coverage.  Nursing staff, Please call [facility] Admits & Consults System-Wide number on Amion as soon as patient's arrival, so appropriate admitting provider can evaluate the pt.

## 2024-02-10 NOTE — ED Notes (Signed)
 I have just given report to [facility], [facility] at [employer].

## 2024-02-10 NOTE — Assessment & Plan Note (Addendum)
 Symptomatic support with ondansetron  4 mg IV every 4 hours as needed for nausea and vomiting Phenergan  12.5 mg IV every 6 hours as needed for refractory nausea and vomiting, 1 day ordered LR infusion at 125 mL/h, 1 day ordered

## 2024-02-10 NOTE — Assessment & Plan Note (Signed)
 With increase across all cell lines, I suspect this is volume contraction in setting of volume loss from intractable vomiting LR 125 ml/hr, 1 day No signs of infectious etiology at this time, no abx indicated Recheck CBC in the AM

## 2024-02-10 NOTE — ED Notes (Signed)
 Zachary called from [facility] for transport 15:14-TC

## 2024-02-10 NOTE — Progress Notes (Signed)
 Received notification of virtual admission done by Dr. Sherre from [facility] ED.  H&P pending. Patient is a pleasant 34 year old gentleman history of gastritis, nonbleeding duodenal ulcer with clean ulcer base.  EGD 08/25/2023 who presents to the ED with a 5-day history of uncontrollable nausea, coffee-ground emesis, epigastric pain and diffuse lower abdominal pain with 2 days of black tarry stools as described per patient.  Patient denied any further NSAID use since EGD of 08/25/2023.  Patient however states once he had completed his bottle of PPI has not been on PPI at least since September 2025 as he felt his symptoms had improved and resolved.  Patient stated did not follow-up with [name] as recommended.  Patient seen in the ED CT abdomen and pelvis done with concerning for small bowel intussusception in the left upper quadrant without evidence of obstruction or obvious mass, typically transient/self-limiting.  Otherwise no CT evidence of acute intra-abdominal or pelvic abnormality.  EDP discussed with Dr. Lyndel he who felt no surgical intervention needed at this time and should be self resolving.  GI consult placed.  Hospitalist were called for admission due to concerns for intussusception and coffee-ground emesis.  FOBT done in the ED was negative. Comprehensive metabolic profile with a potassium of 3.1, chloride of 94, BUN of 24, calcium of 10.9, anion gap of 17, albumin of 5.3, protein of 9.3, bilirubin of 1.4.  High-sensitivity troponin negative.  CBC with a white count of 13.7, hemoglobin of 19.2 otherwise within normal limits.  Chest x-ray unremarkable. Physical exam General: NAD.  Dry mucous membranes. Respiratory: CTAB.  No wheezes, no crackles, no rhonchi.  Fair air movement.  Speaking full sentences. Cardiovascular: RRR no murmurs rubs or gallops.  No JVD.  No lower extremity edema. GI: Abdomen is soft, tender to palpation in the epigastrium and lower abdominal region,  positive bowel sounds, no rebound.  Some guarding. Extremities: No clubbing cyanosis or edema.  Assessment/plan #1 coffee-ground emesis, nausea, vomiting/epigastric abdominal pain/melanotic stools -Patient with prior history of nonbleeding duodenal ulcer with clean ulcer base, gastritis. -Patient also noted with 2 days of black tarry stools. -Patient denies any NSAID use. -Patient did not follow-up as recommended since July 2025 with GI. -Continue IV PPI every 12 hours, Carafate . -Clear liquids. -N.p.o. after midnight. -Check a CBC. -IV fluids. -Consult to GI for further evaluation and management. - Spoke with Dr. Burnette Ee gastroenterology and patient will be seen in consultation tomorrow morning.  2.  Intussusception -Noted on CT abdomen and pelvis. -EDP discussed with general surgery who felt this was self-limiting and no acute intervention needed at this time. -GI consulted, spoke with Dr. Burnette physical gastroenterology and patient will be seen in formal consultation in the morning.  3.  Hypokalemia -Check a magnesium level. -Repeat BMET - IV potassium runs ordered.  4.  Leukocytosis -Likely reactive leukocytosis. -Urinalysis unremarkable. -Patient afebrile. -Repeat lab.  5.  Dehydration -IV fluids.  No charge.

## 2024-02-10 NOTE — Assessment & Plan Note (Addendum)
 General surgery was discussed with cross coverage provider and nothing to do acutely. IV fluid Symptomatic support for pain control

## 2024-02-10 NOTE — H&P (Addendum)
 " History and Physical - Telemedicine  [name] FMW:[id number] DOB: 07-22-1990 DOA: 02/09/2024  PCP: Sun, Vyvyan, MD  Patient coming from: home  Referring provider: Dr. Jerrol Telemedicine provider: Dr. Sherre Patient location: [facility] Referring diagnosis: upper Gi bleed Patient name and DOB verified: Patient was able to verify her first and last name: [name], date of birth: [birth year]. Patient consented to Telemedicine Evaluation: yes RN virtual assistant: Velinda Sharps, RN Video encounter time and date: 02/10/2024  and at approximately: 15:28  Chief Concern: Nausea, vomiting, abdominal pain  HPI: Mr. [name] is a 34 year old male with history of ADHD, history of duodenal ulcer previously secondary to NSAID use.  02/09/2024: he presents to the ED for chief concerns of coffee-ground emesis.  02/10/24 early AM: He was accepted to hospitalist service pending bed availability.  Vitals at the time of my evaluation showed t of 98.2, rr 16, hr 69, blood pressure 121/71, SpO2 96% on room air.  Serum sodium is 140, potassium 3.1, chloride 94, bicarb 29, BUN of 24, serum creatinine 1.02, eGFR greater than 60, nonfasting blood glucose 110, WBC 13.7, hemoglobin of 19.2, platelets of 375.  Anion gap was elevated at 17.  HS troponin was negative x 2.  ED treatment: Protonix  40 mg IV one-time dose, fentanyl , ondansetron  4 mg IV one-time dose, Pepcid  20 mg IV one-time dose, LR 1 L bolus, Phenergan  IV one-time dose.  02/10/2024: Later in the afternoon, I assumed care of patient.  Assessment and history and physical completed via telemedicine encounter. ------------------------ At bedside, via telemedicine encounter, patient was able to tell me his first last name, date of birth.  He reports that he started having nausea and vomiting since Thursday, 02/04/2024.  Reports it got worse over the weekend.  He denies changes to his diet.  He reports he infrequently drinks EtOH, his last  alcoholic drink was more than 3 months ago.  He denies ibuprofen , Aleve , Motrin , BC powder, Goody powder use.  He does not take an aspirin every day.  He reports initially the vomitus was food, and then bile and then it became a light brown color and on Saturday and Sunday it became coffee-ground vomitus.  He reports every time he has coughing or heaving, he does experience chest discomfort.  He denies fever, chills, known sick contacts, shortness of breath, bright red blood per rectum.  He reports his last bowel movement was yesterday and it was a dark brown in color with an abnormal smell.  Social history: He lives at home with his wife and 4 children.  He denies tobacco and IV recreational drug use.  He occasionally uses THC gummy.  ROS: Constitutional: no weight change, no fever ENT/Mouth: no sore throat, no rhinorrhea Eyes: no eye pain, no vision changes Cardiovascular: no chest pain, no dyspnea,  no edema, no palpitations Respiratory: + cough, no sputum, no wheezing Gastrointestinal: + nausea, + vomiting, no diarrhea, no constipation Genitourinary: no urinary incontinence, no dysuria, no hematuria Musculoskeletal: no arthralgias, no myalgias Skin: no skin lesions, no pruritus, Neuro: + weakness, no loss of consciousness, no syncope Psych: no anxiety, no depression, + decrease appetite Heme/Lymph: no bruising, no bleeding  Assessment/Plan  Principal Problem:   Abdominal pain with vomiting Active Problems:   Hypokalemia   Intractable nausea and vomiting   Coffee ground emesis   Intussusception of small intestine (HCC)   PUD (peptic ulcer disease)   Dysuria   Leukocytosis   Assessment and Plan:  * Abdominal pain  with vomiting Suspect secondary to intussusception in setting of possible upper GI bleed Symptomatic support: Morphine  2 mg IV every 4 hours as needed for moderate pain, 1 day ordered; fentanyl  25 mcg IV every 4 hours as needed for severe pain, 1 day ordered AM team  to evaluate patient at bedside for continued pain medication requirements  Coffee ground emesis Ensure and maintain two PIV in patient concerning for upper GI bleed. Hemoglobin is 19.2, I suspect this is hemoconcentration in setting of fluid loss Recheck CBC in the a.m. No indication for transfusion at this time Clear liquid diet; n.p.o. after midnight Gastroenterology service has been consulted and [name] is aware  Intractable nausea and vomiting Symptomatic support with ondansetron  4 mg IV every 4 hours as needed for nausea and vomiting Phenergan  12.5 mg IV every 6 hours as needed for refractory nausea and vomiting, 1 day ordered LR infusion at 125 mL/h, 1 day ordered  Hypokalemia Potassium chloride  10 mill equivalents IV, 4 doses ordered LR 125 ml per hour, 1 day ordered Check magnesium level Recheck BMP in a.m.  Intussusception of small intestine (HCC) General surgery was discussed with cross coverage provider and nothing to do acutely. IV fluid Symptomatic support for pain control  Leukocytosis With increase across all cell lines, I suspect this is volume contraction in setting of volume loss from intractable vomiting LR 125 ml/hr, 1 day No signs of infectious etiology at this time, no abx indicated Recheck CBC in the AM  Dysuria Present on admission UA was negative for leukocytes and nitrates IV fluid  Chart reviewed.   DVT prophylaxis: Pharmacologic DVT not initiated on admission.  AM team to initiate pharmacologic DVT when the benefits outweigh the risk.  Code Status: Full code Diet: Clear liquid diet Family Communication: A phone call was offered, patient declined Disposition Plan: Pending clinical course Consults called: [facility], is aware Admission status: Inpatient, telemetry  Past Medical History:  Diagnosis Date   ADHD    Multiple gastric ulcers    Whooping cough    Past Surgical History:  Procedure Laterality Date    ESOPHAGOGASTRODUODENOSCOPY N/A 08/25/2023   Procedure: EGD (ESOPHAGOGASTRODUODENOSCOPY);  Surgeon: Kriss Estefana DEL, DO;  Location: [facility];  Service: Gastroenterology;  Laterality: N/A;   FOOT SURGERY  06/2023   Social History:  reports that he has quit smoking. His smoking use included e-cigarettes. He uses smokeless tobacco. He reports current alcohol use. He reports that he does not use drugs.  Allergies[1] Family History  Problem Relation Age of Onset   Hypertension Mother    Hypertension Brother    Family history: Family history reviewed and not pertinent.  Prior to Admission medications  Medication Sig Start Date End Date Taking? Authorizing Provider  acetaminophen  (TYLENOL ) 500 MG tablet Take 2 tablets (1,000 mg total) by mouth every 6 (six) hours. 08/30/23   Swayze, Ava, DO  amphetamine -dextroamphetamine  (ADDERALL) 30 MG tablet Take 30 mg by mouth 2 (two) times daily. 07/20/23   [provider]  pantoprazole  (PROTONIX ) 40 MG tablet Take 1 tablet (40 mg total) by mouth 2 (two) times daily. 08/26/23 11/24/23  Laurence Locus, DO  sucralfate  (CARAFATE ) 1 g tablet Take 1 tablet (1 g total) by mouth 4 (four) times daily -  with meals and at bedtime. 08/26/23 11/24/23  Laurence Locus, DO  traMADol  (ULTRAM ) 50 MG tablet Take 1 tablet (50 mg total) by mouth every 6 (six) hours as needed for moderate pain (pain score 4-6). 08/30/23  Swayze, Ava, DO  traZODone  (DESYREL ) 50 MG tablet Take 50 mg by mouth at bedtime as needed.    [provider]   Physical Exam completed with assistance of: Velinda Sharps, RN, who was at bedside during this portion of the virtual encounter:  Vitals:   02/10/24 1207 02/10/24 1215 02/10/24 1456 02/10/24 1632  BP: (!) 124/102 (!) 139/96 121/71 (!) 124/95  Pulse: 64 (!) 58 63 61  Resp: 15 17 16 20   Temp: 98.3 F (36.8 C)  98.2 F (36.8 C) 98 F (36.7 C)  TempSrc: Oral  Oral Oral  SpO2: 96% 96% 96%   Weight:    97.7 kg  Height:    6' 3 (1.905 m)    Constitutional: appears age appropriate, NAD, calm Eyes: EOMI,  conjunctivae normal ENMT: Mucous membranes are moist. Hearing appropriate Neck: normal, supple, no masses, no thyromegaly Respiratory: clear to auscultation bilaterally, no wheezing. Normal respiratory effort. No accessory muscle use.  Cardiovascular: Regular rate and rhythm, no murmurs. No extremity edema. 2+ pedal pulses. Abdomen: + diffused generalized tenderness. Bowel sounds positive.  Musculoskeletal: No joint deformity upper and lower extremities. Good ROM, no contractures, no atrophy. Skin: no rashes, ulcers on visible skin Neurologic: Strength is appropriate upper extremities.  Psychiatric: Normal judgment and insight. Alert and oriented x 3. Normal mood.   EKG: independently reviewed, showing sinus tachycardia with rate of 119, qtc 455  Chest x-ray on Admission: I personally reviewed and I agree with radiologist reading as below.  CT ABDOMEN PELVIS W CONTRAST Result Date: 02/09/2024 CLINICAL DATA:  History of gastric ulcers nausea vomiting EXAM: CT ABDOMEN AND PELVIS WITH CONTRAST TECHNIQUE: Multidetector CT imaging of the abdomen and pelvis was performed using the standard protocol following bolus administration of intravenous contrast. RADIATION DOSE REDUCTION: This exam was performed according to the departmental dose-optimization program which includes automated exposure control, adjustment of the mA and/or kV according to patient size and/or use of iterative reconstruction technique. CONTRAST:  OMNIPAQUE  IOHEXOL  300 MG/ML  SOLN COMPARISON:  CT 08/29/2023, 08/23/2023, 10/25/2017 FINDINGS: Lower chest: Lung bases demonstrate no acute airspace disease. Hepatobiliary: No focal liver abnormality is seen. No gallstones, gallbladder wall thickening, or biliary dilatation. Pancreas: Unremarkable. No pancreatic ductal dilatation or surrounding inflammatory changes. Spleen: Normal in size without focal abnormality.  Adrenals/Urinary Tract: Adrenal glands are unremarkable. Kidneys are normal, without renal calculi, focal lesion, or hydronephrosis. Bladder is unremarkable. Stomach/Bowel: The stomach is within normal limits. Small bowel intussusception in the left upper quadrant, series 2, image 25 through 32, coronal series 4 image 60. No obstruction. No acute bowel wall thickening. Negative appendix. Vascular/Lymphatic: No significant vascular findings are present. No enlarged abdominal or pelvic lymph nodes. Reproductive: Prostate is unremarkable. Other: No ascites or free air Musculoskeletal: No acute osseous abnormality IMPRESSION: 1. Small bowel intussusception in the left upper quadrant without evidence for obstruction or obvious mass. These are typically transient/self limiting 2. Otherwise no CT evidence for acute intra-abdominal or pelvic abnormality. Electronically Signed   By: Luke Bun M.D.   On: 02/09/2024 22:52   DG Chest 2 View Result Date: 02/09/2024 EXAM: 2 VIEW(S) XRAY OF THE CHEST 02/09/2024 10:39:00 PM COMPARISON: 09/18/2017 CLINICAL HISTORY: The patient presents with chest pain. FINDINGS: LUNGS AND PLEURA: No focal pulmonary opacity. No pleural effusion. No pneumothorax. HEART AND MEDIASTINUM: No acute abnormality of the cardiac and mediastinal silhouettes. BONES AND SOFT TISSUES: No acute osseous abnormality. IMPRESSION: 1. No acute cardiopulmonary pathology. Electronically signed by: Morgane Naveau MD  02/09/2024 10:48 PM EST RP Workstation: [id number]   Labs on Admission: I have personally reviewed following labs  CBC: Recent Labs  Lab 02/09/24 1242 02/10/24 1754  WBC 13.7* 11.7*  NEUTROABS  --  7.1  HGB 19.2* 17.1*  HCT 54.0* 49.0  MCV 87.2 88.0  PLT 375 277   Basic Metabolic Panel: Recent Labs  Lab 02/09/24 1242 02/10/24 1754  NA 140 139  K 3.1* 2.8*  CL 94* 96*  CO2 29 31  GLUCOSE 110* 102*  BUN 24* 23*  CREATININE 1.02 0.97  CALCIUM 10.9* 9.8  MG  --  2.6*    GFR: Estimated Creatinine Clearance: 128.3 mL/min (by C-G formula based on SCr of 0.97 mg/dL).  Liver Function Tests: Recent Labs  Lab 02/09/24 1242  AST 21  ALT 26  ALKPHOS 81  BILITOT 1.4*  PROT 9.3*  ALBUMIN 5.3*   Recent Labs  Lab 02/09/24 1242  LIPASE 39   Urine analysis:    Component Value Date/Time   COLORURINE YELLOW 02/09/2024 2316   APPEARANCEUR CLEAR 02/09/2024 2316   LABSPEC >1.046 (H) 02/09/2024 2316   PHURINE 6.5 02/09/2024 2316   GLUCOSEU NEGATIVE 02/09/2024 2316   HGBUR NEGATIVE 02/09/2024 2316   BILIRUBINUR NEGATIVE 02/09/2024 2316   KETONESUR TRACE (A) 02/09/2024 2316   PROTEINUR 30 (A) 02/09/2024 2316   NITRITE NEGATIVE 02/09/2024 2316   LEUKOCYTESUR NEGATIVE 02/09/2024 2316   This document was prepared using Dragon Voice Recognition software and may include unintentional dictation errors.  Dr. Sherre [facility] Location: [place]  If 7PM-7AM, please contact overnight-coverage provider If 7AM-7PM, please contact day attending provider [email or web address]  02/10/2024, 7:37 PM      [1]  Allergies Allergen Reactions   Ibuprofen  Other (See Comments)    Caused ulcers   "

## 2024-02-10 NOTE — Assessment & Plan Note (Signed)
 Present on admission UA was negative for leukocytes and nitrates IV fluid

## 2024-02-10 NOTE — Assessment & Plan Note (Signed)
 Ensure and maintain two PIV in patient concerning for upper GI bleed. Hemoglobin is 19.2, I suspect this is hemoconcentration in setting of fluid loss Recheck CBC in the a.m. No indication for transfusion at this time Clear liquid diet; n.p.o. after midnight Gastroenterology service has been consulted and [name] is aware

## 2024-02-10 NOTE — Assessment & Plan Note (Signed)
 Suspect secondary to intussusception in setting of possible upper GI bleed Symptomatic support: Morphine  2 mg IV every 4 hours as needed for moderate pain, 1 day ordered; fentanyl  25 mcg IV every 4 hours as needed for severe pain, 1 day ordered AM team to evaluate patient at bedside for continued pain medication requirements

## 2024-02-10 NOTE — Assessment & Plan Note (Signed)
 Potassium chloride  10 mill equivalents IV, 4 doses ordered LR 125 ml per hour, 1 day ordered Check magnesium level Recheck BMP in a.m.

## 2024-02-10 NOTE — ED Notes (Signed)
 He slowly and capably ambulaltes to b.r. and back. Upon return to his room he c//o nausea and a flare of abd. Discomfort. Dr. Armenta notified of same.

## 2024-02-10 NOTE — Hospital Course (Addendum)
 Mr. Fredderick Swanger is a 34 year old male with history of ADHD, history of duodenal ulcer previously secondary to NSAID use.  02/09/2024: he presents to the ED for chief concerns of coffee-ground emesis.  02/10/24 early AM: He was accepted to hospitalist service pending bed availability.  Vitals at the time of my evaluation showed t of 98.2, rr 16, hr 69, blood pressure 121/71, SpO2 96% on room air.  Serum sodium is 140, potassium 3.1, chloride 94, bicarb 29, BUN of 24, serum creatinine 1.02, eGFR greater than 60, nonfasting blood glucose 110, WBC 13.7, hemoglobin of 19.2, platelets of 375.  Anion gap was elevated at 17.  HS troponin was negative x 2.  ED treatment: Protonix  40 mg IV one-time dose, fentanyl , ondansetron  4 mg IV one-time dose, Pepcid  20 mg IV one-time dose, LR 1 L bolus, Phenergan  IV one-time dose.  02/10/2024: Later in the afternoon, I assumed care of patient.  Assessment and history and physical completed via telemedicine encounter.

## 2024-02-11 ENCOUNTER — Encounter (HOSPITAL_COMMUNITY)
Admission: EM | Disposition: A | Discharge status: Home / Self Care | Payer: Self-pay | Source: Home / Self Care | Attending: Internal Medicine

## 2024-02-11 ENCOUNTER — Observation Stay (HOSPITAL_COMMUNITY): Admitting: Anesthesiology

## 2024-02-11 ENCOUNTER — Observation Stay (HOSPITAL_COMMUNITY)

## 2024-02-11 ENCOUNTER — Encounter (HOSPITAL_COMMUNITY): Payer: Self-pay | Admitting: Family Medicine

## 2024-02-11 DIAGNOSIS — F909 Attention-deficit hyperactivity disorder, unspecified type: Secondary | ICD-10-CM | POA: Diagnosis present

## 2024-02-11 DIAGNOSIS — R053 Chronic cough: Secondary | ICD-10-CM | POA: Diagnosis present

## 2024-02-11 DIAGNOSIS — K279 Peptic ulcer, site unspecified, unspecified as acute or chronic, without hemorrhage or perforation: Secondary | ICD-10-CM | POA: Diagnosis not present

## 2024-02-11 DIAGNOSIS — K2289 Other specified disease of esophagus: Secondary | ICD-10-CM | POA: Diagnosis present

## 2024-02-11 DIAGNOSIS — K921 Melena: Secondary | ICD-10-CM | POA: Diagnosis present

## 2024-02-11 DIAGNOSIS — D72829 Elevated white blood cell count, unspecified: Secondary | ICD-10-CM

## 2024-02-11 DIAGNOSIS — K561 Intussusception: Secondary | ICD-10-CM | POA: Diagnosis present

## 2024-02-11 DIAGNOSIS — Z8249 Family history of ischemic heart disease and other diseases of the circulatory system: Secondary | ICD-10-CM | POA: Diagnosis not present

## 2024-02-11 DIAGNOSIS — K229 Disease of esophagus, unspecified: Secondary | ICD-10-CM

## 2024-02-11 DIAGNOSIS — K219 Gastro-esophageal reflux disease without esophagitis: Secondary | ICD-10-CM | POA: Diagnosis present

## 2024-02-11 DIAGNOSIS — R1116 Cannabis hyperemesis syndrome: Secondary | ICD-10-CM | POA: Diagnosis present

## 2024-02-11 DIAGNOSIS — E860 Dehydration: Secondary | ICD-10-CM | POA: Diagnosis present

## 2024-02-11 DIAGNOSIS — R109 Unspecified abdominal pain: Secondary | ICD-10-CM | POA: Diagnosis present

## 2024-02-11 DIAGNOSIS — K2970 Gastritis, unspecified, without bleeding: Secondary | ICD-10-CM | POA: Diagnosis present

## 2024-02-11 DIAGNOSIS — E876 Hypokalemia: Secondary | ICD-10-CM

## 2024-02-11 DIAGNOSIS — F17290 Nicotine dependence, other tobacco product, uncomplicated: Secondary | ICD-10-CM | POA: Diagnosis present

## 2024-02-11 DIAGNOSIS — K3189 Other diseases of stomach and duodenum: Secondary | ICD-10-CM | POA: Diagnosis present

## 2024-02-11 DIAGNOSIS — K2211 Ulcer of esophagus with bleeding: Secondary | ICD-10-CM | POA: Diagnosis present

## 2024-02-11 DIAGNOSIS — K920 Hematemesis: Secondary | ICD-10-CM | POA: Diagnosis present

## 2024-02-11 DIAGNOSIS — K2210 Ulcer of esophagus without bleeding: Secondary | ICD-10-CM | POA: Diagnosis not present

## 2024-02-11 DIAGNOSIS — R112 Nausea with vomiting, unspecified: Secondary | ICD-10-CM | POA: Diagnosis not present

## 2024-02-11 DIAGNOSIS — Z8711 Personal history of peptic ulcer disease: Secondary | ICD-10-CM | POA: Diagnosis not present

## 2024-02-11 DIAGNOSIS — R1110 Vomiting, unspecified: Secondary | ICD-10-CM

## 2024-02-11 DIAGNOSIS — R000 Tachycardia, unspecified: Secondary | ICD-10-CM | POA: Diagnosis present

## 2024-02-11 DIAGNOSIS — F1210 Cannabis abuse, uncomplicated: Secondary | ICD-10-CM | POA: Diagnosis present

## 2024-02-11 DIAGNOSIS — R300 Dysuria: Secondary | ICD-10-CM | POA: Diagnosis present

## 2024-02-11 DIAGNOSIS — Z79899 Other long term (current) drug therapy: Secondary | ICD-10-CM | POA: Diagnosis not present

## 2024-02-11 DIAGNOSIS — R1030 Lower abdominal pain, unspecified: Secondary | ICD-10-CM | POA: Diagnosis present

## 2024-02-11 HISTORY — PX: ESOPHAGOGASTRODUODENOSCOPY: SHX5428

## 2024-02-11 HISTORY — PX: BIOPSY OF SKIN SUBCUTANEOUS TISSUE AND/OR MUCOUS MEMBRANE: SHX6741

## 2024-02-11 LAB — BASIC METABOLIC PANEL WITH GFR
Anion gap: 9 (ref 5–15)
BUN: 19 mg/dL (ref 6–20)
CO2: 30 mmol/L (ref 22–32)
Calcium: 9.5 mg/dL (ref 8.9–10.3)
Chloride: 99 mmol/L (ref 98–111)
Creatinine, Ser: 0.94 mg/dL (ref 0.61–1.24)
GFR, Estimated: >60 mL/min
Glucose, Bld: 87 mg/dL (ref 70–99)
Potassium: 3.1 mmol/L — ABNORMAL LOW (ref 3.5–5.1)
Sodium: 138 mmol/L (ref 135–145)

## 2024-02-11 LAB — CBC
HCT: 46.6 % (ref 39.0–52.0)
Hemoglobin: 16 g/dL (ref 13.0–17.0)
MCH: 30.3 pg (ref 26.0–34.0)
MCHC: 34.3 g/dL (ref 30.0–36.0)
MCV: 88.3 fL (ref 80.0–100.0)
Platelets: 248 K/uL (ref 150–400)
RBC: 5.28 MIL/uL (ref 4.22–5.81)
RDW: 12.7 % (ref 11.5–15.5)
WBC: 9.7 K/uL (ref 4.0–10.5)
nRBC: 0 % (ref 0.0–0.2)

## 2024-02-11 MED ORDER — IOHEXOL 9 MG/ML PO SOLN
500.0000 mL | ORAL | Status: AC
  Administered 2024-02-11 (×2): 500 mL via ORAL

## 2024-02-11 MED ORDER — IOHEXOL 300 MG/ML  SOLN
100.0000 mL | Freq: Once | INTRAMUSCULAR | Status: AC | PRN
  Administered 2024-02-11: 100 mL via INTRAVENOUS

## 2024-02-11 MED ORDER — HYDROMORPHONE HCL 1 MG/ML IJ SOLN
1.0000 mg | INTRAMUSCULAR | Status: DC | PRN
  Administered 2024-02-11 – 2024-02-12 (×7): 1 mg via INTRAVENOUS
  Filled 2024-02-11 (×7): qty 1

## 2024-02-11 MED ORDER — MIDAZOLAM HCL 2 MG/2ML IJ SOLN
INTRAMUSCULAR | Status: AC
  Filled 2024-02-11: qty 2

## 2024-02-11 MED ORDER — POTASSIUM CHLORIDE 10 MEQ/100ML IV SOLN
10.0000 meq | INTRAVENOUS | Status: AC
  Administered 2024-02-11 (×4): 10 meq via INTRAVENOUS
  Filled 2024-02-11 (×2): qty 100

## 2024-02-11 MED ORDER — SODIUM CHLORIDE 0.9 % IV SOLN: INTRAVENOUS | Status: DC

## 2024-02-11 MED ORDER — DROPERIDOL 2.5 MG/ML IJ SOLN
0.6250 mg | Freq: Once | INTRAMUSCULAR | Status: AC | PRN
  Administered 2024-02-11: 0.625 mg via INTRAVENOUS

## 2024-02-11 MED ORDER — SODIUM CHLORIDE 0.9 % IV SOLN: INTRAVENOUS | Status: DC | PRN

## 2024-02-11 MED ORDER — PROCHLORPERAZINE EDISYLATE 10 MG/2ML IJ SOLN
10.0000 mg | Freq: Four times a day (QID) | INTRAMUSCULAR | Status: DC | PRN
  Administered 2024-02-11 – 2024-02-12 (×4): 10 mg via INTRAVENOUS
  Filled 2024-02-11 (×4): qty 2

## 2024-02-11 MED ORDER — POTASSIUM CHLORIDE CRYS ER 20 MEQ PO TBCR
40.0000 meq | EXTENDED_RELEASE_TABLET | Freq: Two times a day (BID) | ORAL | Status: DC
  Administered 2024-02-11 – 2024-02-12 (×2): 40 meq via ORAL
  Filled 2024-02-11 (×3): qty 2

## 2024-02-11 MED ORDER — DROPERIDOL 2.5 MG/ML IJ SOLN
INTRAMUSCULAR | Status: AC
  Filled 2024-02-11: qty 2

## 2024-02-11 MED ORDER — MIDAZOLAM HCL (PF) 2 MG/2ML IJ SOLN
INTRAMUSCULAR | Status: DC | PRN
  Administered 2024-02-11: 2 mg via INTRAVENOUS

## 2024-02-11 MED ORDER — PROPOFOL 500 MG/50ML IV EMUL
INTRAVENOUS | Status: DC | PRN
  Administered 2024-02-11: 175 ug/kg/min via INTRAVENOUS

## 2024-02-11 NOTE — Progress Notes (Signed)
 " PROGRESS NOTE    [name]  FMW:[id number] DOB: Feb 09, 1990 DOA: 02/09/2024 PCP: Sun, Vyvyan, MD    Chief Complaint  Patient presents with   Chest Pain   Abdominal Pain    Brief Narrative:  Patient is a pleasant 34 year old gentleman history of gastritis, nonbleeding duodenal ulcer with clean ulcer base.  EGD 08/25/2023 who presents to the ED with a 5-day history of uncontrollable nausea, coffee-ground emesis, epigastric pain and diffuse lower abdominal pain with 2 days of black tarry stools as described per patient.  Patient denied any further NSAID use since EGD of 08/25/2023.  Patient however states once he had completed his bottle of PPI has not been on PPI at least since September 2025 as he felt his symptoms had improved and resolved.  Patient stated did not follow-up with [name] as recommended.  Patient seen in the ED CT abdomen and pelvis done with concerning for small bowel intussusception in the left upper quadrant without evidence of obstruction or obvious mass, typically transient/self-limiting.  Otherwise no CT evidence of acute intra-abdominal or pelvic abnormality.  EDP discussed with Dr. Lyndel he who felt no surgical intervention needed at this time and should be self resolving.  GI consult placed.  Hospitalist were called for admission due to concerns for intussusception and coffee-ground emesis.  FOBT done in the ED was negative. Comprehensive metabolic profile with a potassium of 3.1, chloride of 94, BUN of 24, calcium of 10.9, anion gap of 17, albumin of 5.3, protein of 9.3, bilirubin of 1.4.  High-sensitivity troponin negative.  CBC with a white count of 13.7, hemoglobin of 19.2 otherwise within normal limits.  Chest x-ray unremarkable.   Assessment & Plan:   Principal Problem:   Abdominal pain with vomiting Active Problems:   Hypokalemia   Intractable nausea and vomiting   Coffee ground emesis   Intussusception of small intestine (HCC)   PUD (peptic  ulcer disease)   Dysuria   Leukocytosis   Abnormality of esophagus  #1 coffee-ground emesis, nausea, vomiting/epigastric abdominal pain/melanotic stools/esophageal ulcers, erythematous mucosa in gastric body and antrum per EGD 02/11/2024 -Patient with prior history of nonbleeding duodenal ulcer with clean ulcer base, gastritis. -Patient also noted with 2 days of black tarry stools. -Patient denies any NSAID use. -Patient did not follow-up as recommended since July 2025 with GI. -Patient seen in consultation by GI patient underwent upper endoscopy today 02/11/2024 with noted esophageal ulcers with no bleeding and no stigmata of recent bleeding, biopsy.  Esophageal mucosal changes suspicious for eosinophilic esophagitis, erythematous mucosa in the gastric body and antrum biopsied, normal examined duodenum. -Biopsies were taken for evaluation of eosinophilic esophagitis. -Continue IV PPI every 12 hours, Carafate . -Diet advanced to a regular diet.  GI. -Avoid aspirin and NSAIDs. -GI following and appreciate input and recommendations.   2.  Intussusception -Noted on CT abdomen and pelvis. -EDP discussed with general surgery who felt this was self-limiting and no acute intervention needed at this time. -GI consulted, and following and recommending general surgery input.  - Repeat CT abdomen and pelvis done today with resolution of intussusception.  - General Surgery curb sided who reviewed films and no further surgical needs or workup needed at this time.   - Advance diet to a regular diet.   - Supportive care.    3.  Hypokalemia - Potassium noted at 3.1 this morning  - Replete.    4.  Leukocytosis -Likely reactive leukocytosis. -Urinalysis unremarkable. -Patient afebrile. -Leukocytosis trended down  and has resolved.   5.  Dehydration - Continue IV fluids.   DVT prophylaxis: SCDs Code Status: Full Family Communication: Updated patient, wife at bedside. Disposition: Home when  clinically improved, tolerating oral intake with improvement with abdominal pain.  Status is: Observation The patient will require care spanning > 2 midnights and should be moved to inpatient because: Severity of illness   Consultants:  Gastroenterology: Dr. Saintclair 02/11/2024 Curb sided General Surgery.  Procedures:  CT abdomen and pelvis 02/09/2024, 02/11/2024 Chest x-ray 02/09/2024 Upper endoscopy: 02/11/2024 by Dr. Saintclair  Antimicrobials:  Anti-infectives (From admission, onward)    None         Subjective: Patient sleeping noted to have returned from endoscopy.  Patient with complaints of diffuse abdominal pain which is improved with IV Dilaudid .  Patient states postprocedure had emesis.  Patient denies any chest pain or shortness of breath.  Wife at bedside.  Objective: Vitals:   02/11/24 0940 02/11/24 0950 02/11/24 1010 02/11/24 1423  BP: (!) 156/99 (!) 141/94 135/76 121/76  Pulse: (!) 44 (!) 51 60 62  Resp: (!) 29 18 17  (!) 21  Temp:    98.1 F (36.7 C)  TempSrc:    Oral  SpO2: 97% 95% 95%   Weight:      Height:        Intake/Output Summary (Last 24 hours) at 02/11/2024 1642 Last data filed at 02/10/2024 2022 Gross per 24 hour  Intake 498.7 ml  Output --  Net 498.7 ml   Filed Weights   02/09/24 2117 02/10/24 1632  Weight: 113.4 kg 97.7 kg    Examination:  General exam: NAD. Respiratory system: Clear to auscultation anterior lung fields.  No wheezes, no crackles, no rhonchi.  Fair air movement.  Speaking full sentences.SABRA Respiratory effort normal. Cardiovascular system: S1 & S2 heard, RRR. No JVD, murmurs, rubs, gallops or clicks. No pedal edema. Gastrointestinal system: Abdomen is soft, nondistended, tender to palpation in the epigastrium and diffusely.  Positive bowel sounds.  No rebound.  No guarding.   Central nervous system: Alert and oriented. No focal neurological deficits. Extremities: Symmetric 5 x 5 power. Skin: No rashes, lesions or  ulcers Psychiatry: Judgement and insight appear normal. Mood & affect appropriate.     Data Reviewed: I have personally reviewed following labs and imaging studies  CBC: Recent Labs  Lab 02/09/24 1242 02/10/24 1754 02/11/24 0613  WBC 13.7* 11.7* 9.7  NEUTROABS  --  7.1  --   HGB 19.2* 17.1* 16.0  HCT 54.0* 49.0 46.6  MCV 87.2 88.0 88.3  PLT 375 277 248    Basic Metabolic Panel: Recent Labs  Lab 02/09/24 1242 02/10/24 1754 02/11/24 0613  NA 140 139 138  K 3.1* 2.8* 3.1*  CL 94* 96* 99  CO2 29 31 30   GLUCOSE 110* 102* 87  BUN 24* 23* 19  CREATININE 1.02 0.97 0.94  CALCIUM 10.9* 9.8 9.5  MG  --  2.6*  --     GFR: Estimated Creatinine Clearance: 132.3 mL/min (by C-G formula based on SCr of 0.94 mg/dL).  Liver Function Tests: Recent Labs  Lab 02/09/24 1242  AST 21  ALT 26  ALKPHOS 81  BILITOT 1.4*  PROT 9.3*  ALBUMIN 5.3*    CBG: No results for input(s): GLUCAP in the last 168 hours.   No results found for this or any previous visit (from the past 240 hours).       Radiology Studies: CT ABDOMEN PELVIS W CONTRAST Result  Date: 02/11/2024 CLINICAL DATA:  Generalized abdominal pain, history of small-bowel intussusception EXAM: CT ABDOMEN AND PELVIS WITH CONTRAST TECHNIQUE: Multidetector CT imaging of the abdomen and pelvis was performed using the standard protocol following bolus administration of intravenous contrast. RADIATION DOSE REDUCTION: This exam was performed according to the departmental dose-optimization program which includes automated exposure control, adjustment of the mA and/or kV according to patient size and/or use of iterative reconstruction technique. CONTRAST:  OMNIPAQUE  IOHEXOL  300 MG/ML  SOLN COMPARISON:  02/09/2024 FINDINGS: Lower chest: No acute pleural or parenchymal lung disease. Hepatobiliary: No focal liver abnormality is seen. No gallstones, gallbladder wall thickening, or biliary dilatation. Pancreas: Unremarkable. No  pancreatic ductal dilatation or surrounding inflammatory changes. Spleen: Normal in size without focal abnormality. Adrenals/Urinary Tract: Adrenal glands are unremarkable. Kidneys are normal, without renal calculi, focal lesion, or hydronephrosis. Bladder is unremarkable. Stomach/Bowel: No bowel obstruction or ileus. Normal retrocecal appendix. No bowel wall thickening or inflammatory change. The small bowel-small bowel intussusception seen within the left upper quadrant on prior study has resolved in the interim. Vascular/Lymphatic: No significant vascular findings are present. No enlarged abdominal or pelvic lymph nodes. Reproductive: Prostate is unremarkable. Other: No free fluid or free intraperitoneal gas. No abdominal wall hernia. Musculoskeletal: No acute or destructive bony abnormalities. Reconstructed images demonstrate no additional findings. IMPRESSION: 1. No acute intra-abdominal or intrapelvic process. 2. Interval resolution of the small bowel-small bowel intussusception seen on prior study. Electronically Signed   By: Ozell Daring M.D.   On: 02/11/2024 15:15   CT ABDOMEN PELVIS W CONTRAST Result Date: 02/09/2024 CLINICAL DATA:  History of gastric ulcers nausea vomiting EXAM: CT ABDOMEN AND PELVIS WITH CONTRAST TECHNIQUE: Multidetector CT imaging of the abdomen and pelvis was performed using the standard protocol following bolus administration of intravenous contrast. RADIATION DOSE REDUCTION: This exam was performed according to the departmental dose-optimization program which includes automated exposure control, adjustment of the mA and/or kV according to patient size and/or use of iterative reconstruction technique. CONTRAST:  OMNIPAQUE  IOHEXOL  300 MG/ML  SOLN COMPARISON:  CT 08/29/2023, 08/23/2023, 10/25/2017 FINDINGS: Lower chest: Lung bases demonstrate no acute airspace disease. Hepatobiliary: No focal liver abnormality is seen. No gallstones, gallbladder wall thickening, or biliary  dilatation. Pancreas: Unremarkable. No pancreatic ductal dilatation or surrounding inflammatory changes. Spleen: Normal in size without focal abnormality. Adrenals/Urinary Tract: Adrenal glands are unremarkable. Kidneys are normal, without renal calculi, focal lesion, or hydronephrosis. Bladder is unremarkable. Stomach/Bowel: The stomach is within normal limits. Small bowel intussusception in the left upper quadrant, series 2, image 25 through 32, coronal series 4 image 60. No obstruction. No acute bowel wall thickening. Negative appendix. Vascular/Lymphatic: No significant vascular findings are present. No enlarged abdominal or pelvic lymph nodes. Reproductive: Prostate is unremarkable. Other: No ascites or free air Musculoskeletal: No acute osseous abnormality IMPRESSION: 1. Small bowel intussusception in the left upper quadrant without evidence for obstruction or obvious mass. These are typically transient/self limiting 2. Otherwise no CT evidence for acute intra-abdominal or pelvic abnormality. Electronically Signed   By: Luke Bun M.D.   On: 02/09/2024 22:52   DG Chest 2 View Result Date: 02/09/2024 EXAM: 2 VIEW(S) XRAY OF THE CHEST 02/09/2024 10:39:00 PM COMPARISON: 09/18/2017 CLINICAL HISTORY: The patient presents with chest pain. FINDINGS: LUNGS AND PLEURA: No focal pulmonary opacity. No pleural effusion. No pneumothorax. HEART AND MEDIASTINUM: No acute abnormality of the cardiac and mediastinal silhouettes. BONES AND SOFT TISSUES: No acute osseous abnormality. IMPRESSION: 1. No acute cardiopulmonary pathology. Electronically signed  by: Morgane Naveau MD 02/09/2024 10:48 PM EST RP Workstation: [id number]        Scheduled Meds:  amphetamine -dextroamphetamine   30 mg Oral Daily   pantoprazole  (PROTONIX ) IV  40 mg Intravenous BID   potassium chloride   40 mEq Oral BID   sucralfate   1 g Oral TID WC & HS   Continuous Infusions:  lactated ringers  125 mL/hr at 02/11/24 0204     LOS: 0 days     Time spent: 40 minutes    Toribio Hummer, MD [facility]   To contact the attending provider between 7A-7P or the covering provider during after hours 7P-7A, please log into the web site [email or web address] and access using universal [place] password for that web site. If you do not have the password, please call the hospital operator.  02/11/2024, 4:42 PM    "

## 2024-02-11 NOTE — Progress Notes (Signed)
 Asked to review this patient's CT scan from several days ago concerning for an intussusception with no obstruction and persistent abdominal pain.  He has been evaluated by GI and has some gastritis and esophageal ulcers that are being treated.  A repeat CT scan was done today that reveals resolution of his intussusception on previous CT scan.  No other acute intra-abdominal findings were noted.  No surgical needs at this time.  D/w primary team.  Burnard FORBES Banter, PA-C  3:21 PM 02/11/2024

## 2024-02-11 NOTE — Interval H&P Note (Signed)
 History and Physical Interval Note: 34 year old male with history of duodenal ulcer, THC use, intractable nausea vomiting, coffee-ground emesis and black stool for EGD with propofol .  02/11/2024 8:58 AM  [name]  has presented today for EGD with propofol ,, with the diagnosis of coffee ground emesis.  The various methods of treatment have been discussed with the patient and family. After consideration of risks, benefits and other options for treatment, the patient has consented to  Procedures: EGD (ESOPHAGOGASTRODUODENOSCOPY) (N/A) as a surgical intervention.  The patient's history has been reviewed, patient examined, no change in status, stable for surgery.  I have reviewed the patient's chart and labs.  Questions were answered to the patient's satisfaction.     Estelita Manas

## 2024-02-11 NOTE — Consult Note (Signed)
 [facility] Gastroenterology Consult  Referring Provider: No ref. provider found Primary Care Physician:  Sun, Vyvyan, MD Primary Gastroenterologist: Margarete Primary  Reason for Consultation:  Coffee ground emesis  HPI: [name] is a 34 y.o. male was in his usual state of until Thursday of last week, 6 days ago when he woke up with severe upper and mid abdominal pain associated with nausea and vomiting.  He states nausea and vomiting was intractable, he could not even keep water down, initially was throwing food and fluids, later noticed coffee-ground vomiting and also had black tarry stool.  Since the nausea and vomiting did not get any better he decided to come to the ER.    He has a PMH significant for plantar fasciitis, surgical intervention in June 2025, history of significant NSAID use.  However, states he has not been taking any NSAIDs, Goody powders, aspirin since he was diagnosed with peptic ulcer disease in 07/2023.  He has acid reflux and heartburn for which he takes Tums. Denies difficulty swallowing, pain on swallowing, unintentional weight loss or loss of appetite. He has not had a bowel movement in the last 6 days but is passing gas.  He vapes and uses marijuana, last use reported 30 days ago. He works as a psychologist, occupational.  Prior GI workup: EGD 08/25/2023, Dr. Kriss, hematemesis, melena: Gastritis, nonbleeding 4 mm cratered duodenal ulcer, biopsies did not show evidence of H. pylori  Past Medical History:  Diagnosis Date   ADHD    Multiple gastric ulcers    Whooping cough     Past Surgical History:  Procedure Laterality Date   ESOPHAGOGASTRODUODENOSCOPY N/A 08/25/2023   Procedure: EGD (ESOPHAGOGASTRODUODENOSCOPY);  Surgeon: Kriss Estefana DEL, DO;  Location: [facility];  Service: Gastroenterology;  Laterality: N/A;   FOOT SURGERY  06/2023    Prior to Admission medications  Medication Sig Start Date End Date Taking? Authorizing Provider  acetaminophen  (TYLENOL ) 500  MG tablet Take 2 tablets (1,000 mg total) by mouth every 6 (six) hours. 08/30/23   Swayze, Ava, DO  amphetamine -dextroamphetamine  (ADDERALL) 30 MG tablet Take 30 mg by mouth 2 (two) times daily. 07/20/23   [provider]  pantoprazole  (PROTONIX ) 40 MG tablet Take 1 tablet (40 mg total) by mouth 2 (two) times daily. 08/26/23 11/24/23  Laurence Locus, DO  sucralfate  (CARAFATE ) 1 g tablet Take 1 tablet (1 g total) by mouth 4 (four) times daily -  with meals and at bedtime. 08/26/23 11/24/23  Laurence Locus, DO  traMADol  (ULTRAM ) 50 MG tablet Take 1 tablet (50 mg total) by mouth every 6 (six) hours as needed for moderate pain (pain score 4-6). 08/30/23   Swayze, Ava, DO  traZODone  (DESYREL ) 50 MG tablet Take 50 mg by mouth at bedtime as needed.    [provider]    Current Facility-Administered Medications  Medication Dose Route Frequency Provider Last Rate Last Admin   acetaminophen  (TYLENOL ) tablet 650 mg  650 mg Oral Q6H PRN Cox, Amy N, DO       Or   acetaminophen  (TYLENOL ) suppository 650 mg  650 mg Rectal Q6H PRN Cox, Amy N, DO       amphetamine -dextroamphetamine  (ADDERALL) tablet 30 mg  30 mg Oral Daily Cox, Amy N, DO       fentaNYL  (SUBLIMAZE ) injection 25 mcg  25 mcg Intravenous Q4H PRN Cox, Amy N, DO   25 mcg at 02/11/24 9277   lactated ringers  infusion   Intravenous Continuous Sebastian Toribio GAILS, MD 125 mL/hr  at 02/11/24 0204 New Bag at 02/11/24 0204   morphine  (PF) 2 MG/ML injection 2 mg  2 mg Intravenous Q4H PRN Cox, Amy N, DO   2 mg at 02/10/24 1805   ondansetron  (ZOFRAN ) injection 4 mg  4 mg Intravenous Q4H PRN Cox, Amy N, DO   4 mg at 02/11/24 9276   pantoprazole  (PROTONIX ) injection 40 mg  40 mg Intravenous BID Cox, Amy N, DO   40 mg at 02/10/24 2014   potassium chloride  10 mEq in 100 mL IVPB  10 mEq Intravenous Q1 Hr x 4 Ghimire, Kuber, MD       potassium chloride  SA (KLOR-CON  M) CR tablet 40 mEq  40 mEq Oral BID Raenelle Coria, MD       promethazine  (PHENERGAN ) 12.5 mg in  sodium chloride  0.9 % 50 mL IVPB  12.5 mg Intravenous Q6H PRN Cox, Amy N, DO       sucralfate  (CARAFATE ) tablet 1 g  1 g Oral TID WC & HS Cox, Amy N, DO   1 g at 02/10/24 2015   traZODone  (DESYREL ) tablet 50 mg  50 mg Oral QHS PRN Cox, Amy N, DO        Allergies as of 02/09/2024 - Review Complete 02/09/2024  Allergen Reaction Noted   Ibuprofen  Other (See Comments) 08/29/2023    Family History  Problem Relation Age of Onset   Hypertension Mother    Hypertension Brother     Social History   Socioeconomic History   Marital status: Married    Spouse name: Not on file   Number of children: Not on file   Years of education: Not on file   Highest education level: Not on file  Occupational History   Not on file  Tobacco Use   Smoking status: Former    Types: E-cigarettes   Smokeless tobacco: Current  Vaping Use   Vaping status: Every Day  Substance and Sexual Activity   Alcohol use: Yes    Comment: rare   Drug use: No   Sexual activity: Yes    Partners: Female  Other Topics Concern   Not on file  Social History Narrative   Not on file   Social Drivers of Health   Tobacco Use: High Risk (02/10/2024)   Patient History    Smoking Tobacco Use: Former    Smokeless Tobacco Use: Current    Passive Exposure: Not on Actuary Strain: Not on file  Food Insecurity: No Food Insecurity (02/10/2024)   Epic    Worried About Radiation Protection Practitioner of Food in the Last Year: Never true    [employer] of Food in the Last Year: Never true  Transportation Needs: No Transportation Needs (02/10/2024)   Epic    Lack of Transportation (Medical): No    Lack of Transportation (Non-Medical): No  Physical Activity: Not on file  Stress: Not on file  Social Connections: Not on file  Intimate Partner Violence: Not At Risk (02/10/2024)   Epic    Fear of Current or Ex-Partner: No    Emotionally Abused: No    Physically Abused: No    Sexually Abused: No  Depression (PHQ2-9): Not on file   Alcohol Screen: Not on file  Housing: Low Risk (02/10/2024)   Epic    Unable to Pay for Housing in the Last Year: No    Number of Times Moved in the Last Year: 0    Homeless in the Last Year: No  Utilities: Not  At Risk (02/10/2024)   Epic    Threatened with loss of utilities: No  Health Literacy: Not on file    Review of Systems: As per HPI.  Physical Exam: Vital signs in last 24 hours: Temp:  [98 F (36.7 C)-98.3 F (36.8 C)] 98 F (36.7 C) (01/15 0520) Pulse Rate:  [49-75] 49 (01/15 0520) Resp:  [15-24] 20 (01/15 0520) BP: (121-139)/(71-102) 133/80 (01/15 0520) SpO2:  [96 %-97 %] 96 % (01/14 1456) Weight:  [97.7 kg] 97.7 kg (01/14 1632) Last BM Date : 02/10/24  General:   Alert,  Well-developed, well-nourished, pleasant and cooperative in NAD Head:  Normocephalic and atraumatic. Eyes:  Sclera clear, no icterus.   Conjunctiva pink. Ears:  Normal auditory acuity. Nose:  No deformity, discharge,  or lesions. Mouth:  No deformity or lesions.  Oropharynx pink & moist. Neck:  Supple; no masses or thyromegaly. Lungs:  Clear throughout to auscultation.   No wheezes, crackles, or rhonchi. No acute distress. Heart:  Regular rate and rhythm; no murmurs, clicks, rubs,  or gallops. Extremities:  Without clubbing or edema. Neurologic:  Alert and  oriented x4;  grossly normal neurologically. Skin:  Intact without significant lesions or rashes. Psych:  Alert and cooperative. Normal mood and affect. Abdomen: Mild diffuse upper abdominal pain, nontender, nondistended, normoactive bowel sounds        Lab Results: Recent Labs    02/09/24 1242 02/10/24 1754  WBC 13.7* 11.7*  HGB 19.2* 17.1*  HCT 54.0* 49.0  PLT 375 277   BMET Recent Labs    02/09/24 1242 02/10/24 1754  NA 140 139  K 3.1* 2.8*  CL 94* 96*  CO2 29 31  GLUCOSE 110* 102*  BUN 24* 23*  CREATININE 1.02 0.97  CALCIUM 10.9* 9.8   LFT Recent Labs    02/09/24 1242  PROT 9.3*  ALBUMIN 5.3*  AST 21  ALT  26  ALKPHOS 81  BILITOT 1.4*   PT/INR No results for input(s): LABPROT, INR in the last 72 hours.  Studies/Results: CT ABDOMEN PELVIS W CONTRAST Result Date: 02/09/2024 CLINICAL DATA:  History of gastric ulcers nausea vomiting EXAM: CT ABDOMEN AND PELVIS WITH CONTRAST TECHNIQUE: Multidetector CT imaging of the abdomen and pelvis was performed using the standard protocol following bolus administration of intravenous contrast. RADIATION DOSE REDUCTION: This exam was performed according to the departmental dose-optimization program which includes automated exposure control, adjustment of the mA and/or kV according to patient size and/or use of iterative reconstruction technique. CONTRAST:  OMNIPAQUE  IOHEXOL  300 MG/ML  SOLN COMPARISON:  CT 08/29/2023, 08/23/2023, 10/25/2017 FINDINGS: Lower chest: Lung bases demonstrate no acute airspace disease. Hepatobiliary: No focal liver abnormality is seen. No gallstones, gallbladder wall thickening, or biliary dilatation. Pancreas: Unremarkable. No pancreatic ductal dilatation or surrounding inflammatory changes. Spleen: Normal in size without focal abnormality. Adrenals/Urinary Tract: Adrenal glands are unremarkable. Kidneys are normal, without renal calculi, focal lesion, or hydronephrosis. Bladder is unremarkable. Stomach/Bowel: The stomach is within normal limits. Small bowel intussusception in the left upper quadrant, series 2, image 25 through 32, coronal series 4 image 60. No obstruction. No acute bowel wall thickening. Negative appendix. Vascular/Lymphatic: No significant vascular findings are present. No enlarged abdominal or pelvic lymph nodes. Reproductive: Prostate is unremarkable. Other: No ascites or free air Musculoskeletal: No acute osseous abnormality IMPRESSION: 1. Small bowel intussusception in the left upper quadrant without evidence for obstruction or obvious mass. These are typically transient/self limiting 2. Otherwise no CT evidence for  acute intra-abdominal  or pelvic abnormality. Electronically Signed   By: Luke Bun M.D.   On: 02/09/2024 22:52   DG Chest 2 View Result Date: 02/09/2024 EXAM: 2 VIEW(S) XRAY OF THE CHEST 02/09/2024 10:39:00 PM COMPARISON: 09/18/2017 CLINICAL HISTORY: The patient presents with chest pain. FINDINGS: LUNGS AND PLEURA: No focal pulmonary opacity. No pleural effusion. No pneumothorax. HEART AND MEDIASTINUM: No acute abnormality of the cardiac and mediastinal silhouettes. BONES AND SOFT TISSUES: No acute osseous abnormality. IMPRESSION: 1. No acute cardiopulmonary pathology. Electronically signed by: Morgane Naveau MD 02/09/2024 10:48 PM EST RP Workstation: [id number]    Impression: Coffee-ground emesis, hemoglobin 19.2 and now 17.1 Elevated BUN/creatinine ratio of 20/1.02 Occult blood in stool negative   U tox positive for THC, likely because of intractable nausea vomiting/cyclical vomiting syndrome  CT abdomen shows small bowel intussusception in the left upper quadrant without obstruction or mass, likely transient and self-limiting  Hemoconcentration, total protein 9.3, hypokalemia, 2.8  Plan: Diagnostic EGD today. His hemodynamics are stable, and he has been started on sucralfate  and pantoprazole .   LOS: 0 days   Estelita Manas, MD  02/11/2024, 8:15 AM

## 2024-02-11 NOTE — Plan of Care (Signed)
 Patient is progressing towards discharge goals.

## 2024-02-11 NOTE — Anesthesia Preprocedure Evaluation (Signed)
"                                    Anesthesia Evaluation  Patient identified by MRN, date of birth, ID band Patient awake    Reviewed: Allergy & Precautions, NPO status , Patient's Chart, lab work & pertinent test results  Airway Mallampati: II  TM Distance: >3 FB Neck ROM: Full    Dental   Pulmonary Patient abstained from smoking., former smoker   Pulmonary exam normal        Cardiovascular negative cardio ROS  Rhythm:Regular Rate:Normal     Neuro/Psych negative neurological ROS     GI/Hepatic Neg liver ROS, PUD,,,  Endo/Other  negative endocrine ROS    Renal/GU negative Renal ROS     Musculoskeletal   Abdominal   Peds  Hematology negative hematology ROS (+)   Anesthesia Other Findings   Reproductive/Obstetrics                              Anesthesia Physical Anesthesia Plan  ASA: 2  Anesthesia Plan: MAC   Post-op Pain Management:    Induction:   PONV Risk Score and Plan: 1 and Propofol  infusion  Airway Management Planned: Natural Airway and Nasal Cannula  Additional Equipment:   Intra-op Plan:   Post-operative Plan:   Informed Consent: I have reviewed the patients History and Physical, chart, labs and discussed the procedure including the risks, benefits and alternatives for the proposed anesthesia with the patient or authorized representative who has indicated his/her understanding and acceptance.       Plan Discussed with:   Anesthesia Plan Comments:         Anesthesia Quick Evaluation  "

## 2024-02-11 NOTE — Op Note (Signed)
 [facility] Patient Name: [name] Procedure Date: 02/11/2024 MRN: [id number] Attending MD: Estelita Manas , MD, [id number] Date of Birth: 1991/01/20 CSN: [id number] Age: 34 Admit Type: Inpatient Procedure:                Upper GI endoscopy Indications:              Coffee-ground emesis, Melena Providers:                Estelita Manas, MD, Ozell Pouch, Fairy [place],                            Technician Referring MD:             [facility] Medicines:                See the Anesthesia note for documentation of the                            administered medications Complications:            No immediate complications. Estimated blood loss:                            Minimal. Estimated Blood Loss:     Estimated blood loss was minimal. Procedure:                Pre-Anesthesia Assessment:                           - Prior to the procedure, a History and Physical                            was performed, and patient medications and                            allergies were reviewed. The patient's tolerance of                            previous anesthesia was also reviewed. The risks                            and benefits of the procedure and the sedation                            options and risks were discussed with the patient.                            All questions were answered, and informed consent                            was obtained. Prior Anticoagulants: The patient has                            taken no anticoagulant or antiplatelet agents. ASA                            Grade Assessment:  II - A patient with mild systemic                            disease. After reviewing the risks and benefits,                            the patient was deemed in satisfactory condition to                            undergo the procedure.                           After obtaining informed consent, the endoscope was                            passed under  direct vision. Throughout the                            procedure, the patient's blood pressure, pulse, and                            oxygen saturations were monitored continuously. The                            [id number]-[id number] ([id number]) Olympus endoscope was introduced                            through the mouth, and advanced to the second part                            of duodenum. The upper GI endoscopy was                            accomplished without difficulty. The patient                            tolerated the procedure well. Scope In: Scope Out: Findings:      Few linear and superficial esophageal ulcers with no bleeding and no       stigmata of recent bleeding were found 35 to 40 cm from the incisors.       Biopsies were taken with a cold forceps for histology.      Mucosal changes including ringed esophagus, circumferential folds and       congestion (edema) were found in the proximal esophagus and in the mid       esophagus.      Esophageal findings were graded using the Eosinophilic Esophagitis       Endoscopic Reference Score (EoE-EREFS) as:      Edema Grade 1 Present (decreased clarity or absence of vascular       markings),      Rings Grade 2 Moderate (distinct rings that do not occlude passage of       diagnostic 8-10 mm endoscope),      Exudates Grade 0 None (no white lesions seen), Furrows Grade 0 None (no       vertical lines seen) and  Stricture none (no stricture found).      Biopsies were obtained from the proximal and mid esophagus with cold       forceps for histology of suspected eosinophilic esophagitis.      Diffuse mildly erythematous mucosa without bleeding was found in the       gastric body and in the gastric antrum. Biopsies were taken with a cold       forceps for Helicobacter pylori testing.      The cardia and gastric fundus were normal on retroflexion.      The examined duodenum was normal. Impression:               - Esophageal ulcers with  no bleeding and no                            stigmata of recent bleeding. Biopsied.                           - Esophageal mucosal changes suspicious for                            eosinophilic esophagitis.                           - Erythematous mucosa in the gastric body and                            antrum. Biopsied.                           - Normal examined duodenum.                           - Biopsies were taken with a cold forceps for                            evaluation of eosinophilic esophagitis. Moderate Sedation:      Patient did not receive moderate sedation for this procedure, but       instead received monitored anesthesia care. Recommendation:           - Resume regular diet.                           - Continue present medications.                           - Await pathology results.                           - Avoid THC use.                           - Avoid ASA and NSAIDs. Procedure Code(s):        --- Professional ---                           [phone], Esophagogastroduodenoscopy, flexible,  transoral; with biopsy, single or multiple Diagnosis Code(s):        --- Professional ---                           K22.10, Ulcer of esophagus without bleeding                           K22.89, Other specified disease of esophagus                           K31.89, Other diseases of stomach and duodenum                           K92.0, Hematemesis                           K92.1, Melena (includes Hematochezia) CPT copyright 2022 [employer]. All rights reserved. The codes documented in this report are preliminary and upon coder review may  be revised to meet current compliance requirements. Estelita Manas, MD 02/11/2024 9:21:27 AM This report has been signed electronically. Number of Addenda: 0

## 2024-02-11 NOTE — Transfer of Care (Signed)
 Immediate Anesthesia Transfer of Care Note  Patient: [name]  Procedure(s) Performed: EGD (ESOPHAGOGASTRODUODENOSCOPY) BIOPSY, SKIN, SUBCUTANEOUS TISSUE, OR MUCOUS MEMBRANE  Patient Location: PACU  Anesthesia Type:MAC  Level of Consciousness: sedated, patient cooperative, and responds to stimulation  Airway & Oxygen Therapy: Patient Spontanous Breathing and Patient connected to face mask oxygen  Post-op Assessment: Report given to RN and Post -op Vital signs reviewed and stable  Post vital signs: Reviewed and stable  Last Vitals:  Vitals Value Taken Time  BP 120/68 02/11/24 09:17  Temp    Pulse 87 02/11/24 09:18  Resp 35 02/11/24 09:18  SpO2 97 % 02/11/24 09:18  Vitals shown include unfiled device data.  Last Pain:  Vitals:   02/11/24 0823  TempSrc: Temporal  PainSc: 8       Patients Stated Pain Goal: 3 (02/10/24 1805)  Complications: No notable events documented.

## 2024-02-11 NOTE — H&P (View-Only) (Signed)
 [facility] Gastroenterology Consult  Referring Provider: No ref. provider found Primary Care Physician:  Sun, Vyvyan, MD Primary Gastroenterologist: Margarete Primary  Reason for Consultation:  Coffee ground emesis  HPI: [name] is a 34 y.o. male was in his usual state of until Thursday of last week, 6 days ago when he woke up with severe upper and mid abdominal pain associated with nausea and vomiting.  He states nausea and vomiting was intractable, he could not even keep water down, initially was throwing food and fluids, later noticed coffee-ground vomiting and also had black tarry stool.  Since the nausea and vomiting did not get any better he decided to come to the ER.    He has a PMH significant for plantar fasciitis, surgical intervention in June 2025, history of significant NSAID use.  However, states he has not been taking any NSAIDs, Goody powders, aspirin since he was diagnosed with peptic ulcer disease in 07/2023.  He has acid reflux and heartburn for which he takes Tums. Denies difficulty swallowing, pain on swallowing, unintentional weight loss or loss of appetite. He has not had a bowel movement in the last 6 days but is passing gas.  He vapes and uses marijuana, last use reported 30 days ago. He works as a psychologist, occupational.  Prior GI workup: EGD 08/25/2023, Dr. Kriss, hematemesis, melena: Gastritis, nonbleeding 4 mm cratered duodenal ulcer, biopsies did not show evidence of H. pylori  Past Medical History:  Diagnosis Date   ADHD    Multiple gastric ulcers    Whooping cough     Past Surgical History:  Procedure Laterality Date   ESOPHAGOGASTRODUODENOSCOPY N/A 08/25/2023   Procedure: EGD (ESOPHAGOGASTRODUODENOSCOPY);  Surgeon: Kriss Estefana DEL, DO;  Location: [facility];  Service: Gastroenterology;  Laterality: N/A;   FOOT SURGERY  06/2023    Prior to Admission medications  Medication Sig Start Date End Date Taking? Authorizing Provider  acetaminophen  (TYLENOL ) 500  MG tablet Take 2 tablets (1,000 mg total) by mouth every 6 (six) hours. 08/30/23   Swayze, Ava, DO  amphetamine -dextroamphetamine  (ADDERALL) 30 MG tablet Take 30 mg by mouth 2 (two) times daily. 07/20/23   [provider]  pantoprazole  (PROTONIX ) 40 MG tablet Take 1 tablet (40 mg total) by mouth 2 (two) times daily. 08/26/23 11/24/23  Laurence Locus, DO  sucralfate  (CARAFATE ) 1 g tablet Take 1 tablet (1 g total) by mouth 4 (four) times daily -  with meals and at bedtime. 08/26/23 11/24/23  Laurence Locus, DO  traMADol  (ULTRAM ) 50 MG tablet Take 1 tablet (50 mg total) by mouth every 6 (six) hours as needed for moderate pain (pain score 4-6). 08/30/23   Swayze, Ava, DO  traZODone  (DESYREL ) 50 MG tablet Take 50 mg by mouth at bedtime as needed.    [provider]    Current Facility-Administered Medications  Medication Dose Route Frequency Provider Last Rate Last Admin   acetaminophen  (TYLENOL ) tablet 650 mg  650 mg Oral Q6H PRN Cox, Amy N, DO       Or   acetaminophen  (TYLENOL ) suppository 650 mg  650 mg Rectal Q6H PRN Cox, Amy N, DO       amphetamine -dextroamphetamine  (ADDERALL) tablet 30 mg  30 mg Oral Daily Cox, Amy N, DO       fentaNYL  (SUBLIMAZE ) injection 25 mcg  25 mcg Intravenous Q4H PRN Cox, Amy N, DO   25 mcg at 02/11/24 9277   lactated ringers  infusion   Intravenous Continuous Sebastian Toribio GAILS, MD 125 mL/hr  at 02/11/24 0204 New Bag at 02/11/24 0204   morphine  (PF) 2 MG/ML injection 2 mg  2 mg Intravenous Q4H PRN Cox, Amy N, DO   2 mg at 02/10/24 1805   ondansetron  (ZOFRAN ) injection 4 mg  4 mg Intravenous Q4H PRN Cox, Amy N, DO   4 mg at 02/11/24 9276   pantoprazole  (PROTONIX ) injection 40 mg  40 mg Intravenous BID Cox, Amy N, DO   40 mg at 02/10/24 2014   potassium chloride  10 mEq in 100 mL IVPB  10 mEq Intravenous Q1 Hr x 4 Ghimire, Kuber, MD       potassium chloride  SA (KLOR-CON  M) CR tablet 40 mEq  40 mEq Oral BID Raenelle Coria, MD       promethazine  (PHENERGAN ) 12.5 mg in  sodium chloride  0.9 % 50 mL IVPB  12.5 mg Intravenous Q6H PRN Cox, Amy N, DO       sucralfate  (CARAFATE ) tablet 1 g  1 g Oral TID WC & HS Cox, Amy N, DO   1 g at 02/10/24 2015   traZODone  (DESYREL ) tablet 50 mg  50 mg Oral QHS PRN Cox, Amy N, DO        Allergies as of 02/09/2024 - Review Complete 02/09/2024  Allergen Reaction Noted   Ibuprofen  Other (See Comments) 08/29/2023    Family History  Problem Relation Age of Onset   Hypertension Mother    Hypertension Brother     Social History   Socioeconomic History   Marital status: Married    Spouse name: Not on file   Number of children: Not on file   Years of education: Not on file   Highest education level: Not on file  Occupational History   Not on file  Tobacco Use   Smoking status: Former    Types: E-cigarettes   Smokeless tobacco: Current  Vaping Use   Vaping status: Every Day  Substance and Sexual Activity   Alcohol use: Yes    Comment: rare   Drug use: No   Sexual activity: Yes    Partners: Female  Other Topics Concern   Not on file  Social History Narrative   Not on file   Social Drivers of Health   Tobacco Use: High Risk (02/10/2024)   Patient History    Smoking Tobacco Use: Former    Smokeless Tobacco Use: Current    Passive Exposure: Not on Actuary Strain: Not on file  Food Insecurity: No Food Insecurity (02/10/2024)   Epic    Worried About Radiation Protection Practitioner of Food in the Last Year: Never true    [employer] of Food in the Last Year: Never true  Transportation Needs: No Transportation Needs (02/10/2024)   Epic    Lack of Transportation (Medical): No    Lack of Transportation (Non-Medical): No  Physical Activity: Not on file  Stress: Not on file  Social Connections: Not on file  Intimate Partner Violence: Not At Risk (02/10/2024)   Epic    Fear of Current or Ex-Partner: No    Emotionally Abused: No    Physically Abused: No    Sexually Abused: No  Depression (PHQ2-9): Not on file   Alcohol Screen: Not on file  Housing: Low Risk (02/10/2024)   Epic    Unable to Pay for Housing in the Last Year: No    Number of Times Moved in the Last Year: 0    Homeless in the Last Year: No  Utilities: Not  At Risk (02/10/2024)   Epic    Threatened with loss of utilities: No  Health Literacy: Not on file    Review of Systems: As per HPI.  Physical Exam: Vital signs in last 24 hours: Temp:  [98 F (36.7 C)-98.3 F (36.8 C)] 98 F (36.7 C) (01/15 0520) Pulse Rate:  [49-75] 49 (01/15 0520) Resp:  [15-24] 20 (01/15 0520) BP: (121-139)/(71-102) 133/80 (01/15 0520) SpO2:  [96 %-97 %] 96 % (01/14 1456) Weight:  [97.7 kg] 97.7 kg (01/14 1632) Last BM Date : 02/10/24  General:   Alert,  Well-developed, well-nourished, pleasant and cooperative in NAD Head:  Normocephalic and atraumatic. Eyes:  Sclera clear, no icterus.   Conjunctiva pink. Ears:  Normal auditory acuity. Nose:  No deformity, discharge,  or lesions. Mouth:  No deformity or lesions.  Oropharynx pink & moist. Neck:  Supple; no masses or thyromegaly. Lungs:  Clear throughout to auscultation.   No wheezes, crackles, or rhonchi. No acute distress. Heart:  Regular rate and rhythm; no murmurs, clicks, rubs,  or gallops. Extremities:  Without clubbing or edema. Neurologic:  Alert and  oriented x4;  grossly normal neurologically. Skin:  Intact without significant lesions or rashes. Psych:  Alert and cooperative. Normal mood and affect. Abdomen: Mild diffuse upper abdominal pain, nontender, nondistended, normoactive bowel sounds        Lab Results: Recent Labs    02/09/24 1242 02/10/24 1754  WBC 13.7* 11.7*  HGB 19.2* 17.1*  HCT 54.0* 49.0  PLT 375 277   BMET Recent Labs    02/09/24 1242 02/10/24 1754  NA 140 139  K 3.1* 2.8*  CL 94* 96*  CO2 29 31  GLUCOSE 110* 102*  BUN 24* 23*  CREATININE 1.02 0.97  CALCIUM 10.9* 9.8   LFT Recent Labs    02/09/24 1242  PROT 9.3*  ALBUMIN 5.3*  AST 21  ALT  26  ALKPHOS 81  BILITOT 1.4*   PT/INR No results for input(s): LABPROT, INR in the last 72 hours.  Studies/Results: CT ABDOMEN PELVIS W CONTRAST Result Date: 02/09/2024 CLINICAL DATA:  History of gastric ulcers nausea vomiting EXAM: CT ABDOMEN AND PELVIS WITH CONTRAST TECHNIQUE: Multidetector CT imaging of the abdomen and pelvis was performed using the standard protocol following bolus administration of intravenous contrast. RADIATION DOSE REDUCTION: This exam was performed according to the departmental dose-optimization program which includes automated exposure control, adjustment of the mA and/or kV according to patient size and/or use of iterative reconstruction technique. CONTRAST:  OMNIPAQUE  IOHEXOL  300 MG/ML  SOLN COMPARISON:  CT 08/29/2023, 08/23/2023, 10/25/2017 FINDINGS: Lower chest: Lung bases demonstrate no acute airspace disease. Hepatobiliary: No focal liver abnormality is seen. No gallstones, gallbladder wall thickening, or biliary dilatation. Pancreas: Unremarkable. No pancreatic ductal dilatation or surrounding inflammatory changes. Spleen: Normal in size without focal abnormality. Adrenals/Urinary Tract: Adrenal glands are unremarkable. Kidneys are normal, without renal calculi, focal lesion, or hydronephrosis. Bladder is unremarkable. Stomach/Bowel: The stomach is within normal limits. Small bowel intussusception in the left upper quadrant, series 2, image 25 through 32, coronal series 4 image 60. No obstruction. No acute bowel wall thickening. Negative appendix. Vascular/Lymphatic: No significant vascular findings are present. No enlarged abdominal or pelvic lymph nodes. Reproductive: Prostate is unremarkable. Other: No ascites or free air Musculoskeletal: No acute osseous abnormality IMPRESSION: 1. Small bowel intussusception in the left upper quadrant without evidence for obstruction or obvious mass. These are typically transient/self limiting 2. Otherwise no CT evidence for  acute intra-abdominal  or pelvic abnormality. Electronically Signed   By: Luke Bun M.D.   On: 02/09/2024 22:52   DG Chest 2 View Result Date: 02/09/2024 EXAM: 2 VIEW(S) XRAY OF THE CHEST 02/09/2024 10:39:00 PM COMPARISON: 09/18/2017 CLINICAL HISTORY: The patient presents with chest pain. FINDINGS: LUNGS AND PLEURA: No focal pulmonary opacity. No pleural effusion. No pneumothorax. HEART AND MEDIASTINUM: No acute abnormality of the cardiac and mediastinal silhouettes. BONES AND SOFT TISSUES: No acute osseous abnormality. IMPRESSION: 1. No acute cardiopulmonary pathology. Electronically signed by: Morgane Naveau MD 02/09/2024 10:48 PM EST RP Workstation: [id number]    Impression: Coffee-ground emesis, hemoglobin 19.2 and now 17.1 Elevated BUN/creatinine ratio of 20/1.02 Occult blood in stool negative   U tox positive for THC, likely because of intractable nausea vomiting/cyclical vomiting syndrome  CT abdomen shows small bowel intussusception in the left upper quadrant without obstruction or mass, likely transient and self-limiting  Hemoconcentration, total protein 9.3, hypokalemia, 2.8  Plan: Diagnostic EGD today. His hemodynamics are stable, and he has been started on sucralfate  and pantoprazole .   LOS: 0 days   Estelita Manas, MD  02/11/2024, 8:15 AM

## 2024-02-11 NOTE — Plan of Care (Signed)
  Problem: Education: Goal: Knowledge of General Education information will improve Description: Including pain rating scale, medication(s)/side effects and non-pharmacologic comfort measures Outcome: Progressing   Problem: Clinical Measurements: Goal: Will remain free from infection Outcome: Progressing   Problem: Clinical Measurements: Goal: Respiratory complications will improve Outcome: Progressing   Problem: Activity: Goal: Risk for activity intolerance will decrease Outcome: Progressing

## 2024-02-11 NOTE — Anesthesia Postprocedure Evaluation (Signed)
"   Anesthesia Post Note  Patient: [name]  Procedure(s) Performed: EGD (ESOPHAGOGASTRODUODENOSCOPY) BIOPSY, SKIN, SUBCUTANEOUS TISSUE, OR MUCOUS MEMBRANE     Patient location during evaluation: PACU Anesthesia Type: MAC Level of consciousness: awake and alert Pain management: pain level controlled Vital Signs Assessment: post-procedure vital signs reviewed and stable Respiratory status: spontaneous breathing, nonlabored ventilation, respiratory function stable and patient connected to nasal cannula oxygen Cardiovascular status: stable and blood pressure returned to baseline Postop Assessment: no apparent nausea or vomiting Anesthetic complications: no   No notable events documented.  Last Vitals:  Vitals:   02/11/24 0950 02/11/24 1010  BP: (!) 141/94 135/76  Pulse: (!) 51 60  Resp: 18 17  Temp:    SpO2: 95% 95%    Last Pain:  Vitals:   02/11/24 1010  TempSrc:   PainSc: 0-No pain                 [name]      "

## 2024-02-12 ENCOUNTER — Other Ambulatory Visit (HOSPITAL_COMMUNITY): Payer: Self-pay

## 2024-02-12 DIAGNOSIS — R112 Nausea with vomiting, unspecified: Secondary | ICD-10-CM | POA: Diagnosis not present

## 2024-02-12 DIAGNOSIS — K561 Intussusception: Secondary | ICD-10-CM | POA: Diagnosis not present

## 2024-02-12 DIAGNOSIS — D72829 Elevated white blood cell count, unspecified: Secondary | ICD-10-CM | POA: Diagnosis not present

## 2024-02-12 DIAGNOSIS — R109 Unspecified abdominal pain: Secondary | ICD-10-CM | POA: Diagnosis not present

## 2024-02-12 LAB — CBC WITH DIFFERENTIAL/PLATELET
Abs Immature Granulocytes: 0.1 K/uL — ABNORMAL HIGH (ref 0.00–0.07)
Basophils Absolute: 0.1 K/uL (ref 0.0–0.1)
Basophils Relative: 1 %
Eosinophils Absolute: 0.2 K/uL (ref 0.0–0.5)
Eosinophils Relative: 2 %
HCT: 41.9 % (ref 39.0–52.0)
Hemoglobin: 14.2 g/dL (ref 13.0–17.0)
Immature Granulocytes: 1 %
Lymphocytes Relative: 28 %
Lymphs Abs: 2.5 K/uL (ref 0.7–4.0)
MCH: 30.2 pg (ref 26.0–34.0)
MCHC: 33.9 g/dL (ref 30.0–36.0)
MCV: 89.1 fL (ref 80.0–100.0)
Monocytes Absolute: 0.6 K/uL (ref 0.1–1.0)
Monocytes Relative: 7 %
Neutro Abs: 5.4 K/uL (ref 1.7–7.7)
Neutrophils Relative %: 61 %
Platelets: 202 K/uL (ref 150–400)
RBC: 4.7 MIL/uL (ref 4.22–5.81)
RDW: 12.5 % (ref 11.5–15.5)
WBC: 8.8 K/uL (ref 4.0–10.5)
nRBC: 0 % (ref 0.0–0.2)

## 2024-02-12 LAB — RENAL FUNCTION PANEL
Albumin: 3.9 g/dL (ref 3.5–5.0)
Anion gap: 7 (ref 5–15)
BUN: 13 mg/dL (ref 6–20)
CO2: 28 mmol/L (ref 22–32)
Calcium: 9 mg/dL (ref 8.9–10.3)
Chloride: 102 mmol/L (ref 98–111)
Creatinine, Ser: 0.8 mg/dL (ref 0.61–1.24)
GFR, Estimated: >60 mL/min
Glucose, Bld: 86 mg/dL (ref 70–99)
Phosphorus: 2.8 mg/dL (ref 2.5–4.6)
Potassium: 3.8 mmol/L (ref 3.5–5.1)
Sodium: 138 mmol/L (ref 135–145)

## 2024-02-12 MED ORDER — PANTOPRAZOLE SODIUM 40 MG PO TBEC
DELAYED_RELEASE_TABLET | ORAL | 1 refills | Status: AC
  Filled 2024-02-12: qty 120, 30d supply, fill #0

## 2024-02-12 MED ORDER — OXYCODONE HCL 5 MG PO TABS
5.0000 mg | ORAL_TABLET | ORAL | 0 refills | Status: DC | PRN
  Filled 2024-02-12: qty 15, 3d supply, fill #0

## 2024-02-12 MED ORDER — PANTOPRAZOLE SODIUM 40 MG PO TBEC: 40.0000 mg | DELAYED_RELEASE_TABLET | Freq: Two times a day (BID) | ORAL | Status: DC

## 2024-02-12 MED ORDER — SUCRALFATE 1 G PO TABS
1.0000 g | ORAL_TABLET | Freq: Three times a day (TID) | ORAL | 0 refills | Status: AC
  Filled 2024-02-12: qty 56, 14d supply, fill #0

## 2024-02-12 MED ORDER — PROCHLORPERAZINE MALEATE 10 MG PO TABS
10.0000 mg | ORAL_TABLET | Freq: Four times a day (QID) | ORAL | 0 refills | Status: AC | PRN
  Filled 2024-02-12: qty 20, 5d supply, fill #0

## 2024-02-12 MED ORDER — ACETAMINOPHEN 500 MG PO TABS: 1000.0000 mg | ORAL_TABLET | Freq: Four times a day (QID) | ORAL | Status: AC | PRN

## 2024-02-12 NOTE — Progress Notes (Signed)
 Discharge meds in a secure bag delivered to patient by this RN.  AVS and return to work note placed in discharge envelope by this RN. Patient dressed for discharge- ambulated to lobby with this RN- patient home with wife

## 2024-02-12 NOTE — Plan of Care (Signed)

## 2024-02-12 NOTE — Discharge Summary (Signed)
 Physician Discharge Summary  [name] FMW:[id number] DOB: Apr 17, 1990 DOA: 02/09/2024  PCP: Sun, Vyvyan, MD  Admit date: 02/09/2024 Discharge date: 02/12/2024  Time spent: 60 minutes  Recommendations for Outpatient Follow-up:  Follow-up with Sun, Vyvyan, MD in 2 weeks.  On follow-up patient need a CBC done to follow-up on counts.  Patient will need a basic metabolic profile, magnesium level done to follow-up on electrolytes and renal function. Follow-up with Dr. Saintclair, gastroenterology as needed.   Discharge Diagnoses:  Principal Problem:   Abdominal pain with vomiting Active Problems:   Hypokalemia   Intractable nausea and vomiting   Coffee ground emesis   Intussusception of small intestine (HCC)   PUD (peptic ulcer disease)   Dysuria   Leukocytosis   Abnormality of esophagus   Discharge Condition: Stable and improved.  Diet recommendation: Regular  Filed Weights   02/09/24 2117 02/10/24 1632  Weight: 113.4 kg 97.7 kg    History of present illness:  HPI per Dr. Sherre [name] is a 34 year old male with history of ADHD, history of duodenal ulcer previously secondary to NSAID use.   02/09/2024: he presents to the ED for chief concerns of coffee-ground emesis.   02/10/24 early AM: He was accepted to hospitalist service pending bed availability.   Vitals at the time of my evaluation showed t of 98.2, rr 16, hr 69, blood pressure 121/71, SpO2 96% on room air.   Serum sodium is 140, potassium 3.1, chloride 94, bicarb 29, BUN of 24, serum creatinine 1.02, eGFR greater than 60, nonfasting blood glucose 110, WBC 13.7, hemoglobin of 19.2, platelets of 375.   Anion gap was elevated at 17.  HS troponin was negative x 2.   ED treatment: Protonix  40 mg IV one-time dose, fentanyl , ondansetron  4 mg IV one-time dose, Pepcid  20 mg IV one-time dose, LR 1 L bolus, Phenergan  IV one-time dose.   02/10/2024: Later in the afternoon, I assumed care of patient.  Assessment  and history and physical completed via telemedicine encounter. ------------------------ At bedside, via telemedicine encounter, patient was able to tell me his first last name, date of birth.   He reports that he started having nausea and vomiting since Thursday, 02/04/2024.  Reports it got worse over the weekend.  He denies changes to his diet.  He reports he infrequently drinks EtOH, his last alcoholic drink was more than 3 months ago.  He denies ibuprofen , Aleve , Motrin , BC powder, Goody powder use.  He does not take an aspirin every day.   He reports initially the vomitus was food, and then bile and then it became a light brown color and on Saturday and Sunday it became coffee-ground vomitus.   He reports every time he has coughing or heaving, he does experience chest discomfort.  He denies fever, chills, known sick contacts, shortness of breath, bright red blood per rectum.  He reports his last bowel movement was yesterday and it was a dark brown in color with an abnormal smell.  Hospital Course:  #1 coffee-ground emesis, nausea, vomiting/epigastric abdominal pain/melanotic stools/esophageal ulcers, erythematous mucosa in gastric body and antrum per EGD 02/11/2024 -Patient with prior history of nonbleeding duodenal ulcer with clean ulcer base, gastritis. -Patient also noted with 2 days of black tarry stools. -Patient denied any NSAID use. -Patient did not follow-up as recommended since July 2025 with GI. -Patient seen in consultation by GI patient underwent upper endoscopy today 02/11/2024 with noted esophageal ulcers with no bleeding and no stigmata of recent  bleeding, biopsy.  Esophageal mucosal changes suspicious for eosinophilic esophagitis, erythematous mucosa in the gastric body and antrum biopsied, normal examined duodenum. -Biopsies were taken for evaluation of eosinophilic esophagitis. -Gastric biopsies with no H. pylori, negative for intestinal metaplasia or dysplasia.  -Biopsy of  esophageal ulcer with marked active esophagitis with ulcer, negative for intestinal metaplasia, dysplasia or carcinoma.  No fungal elements, HSV or CMV viral inclusion identified on H&E stain. -Mid esophageal biopsy with focal active esophagitis with reactive changes.  Negative for intestinal metaplasia, dysplasia or carcinoma.  No HSV or CMV viral inclusions identified on H&E stain.  No fungal elements identified on H&E stain. -Patient maintained on IV PPI every 12 hours as well as Carafate . -IV PPI transition to oral PPI. -Patient improved clinically and be discharged home in stable and improved condition on PPI twice daily x 1 month, then PPI daily x 1 month and then as needed. -Patient also be discharged on Carafate  x 2 weeks. -Patient advised to avoid aspirin and NSAIDs.  -Per GI patient's nausea and vomiting likely secondary to cannabinoid hyperemesis syndrome and patient advised on THC cessation.  -Outpatient follow-up with PCP. -Outpatient follow-up with GI as needed.   2.  Intussusception -Noted on CT abdomen and pelvis. -EDP discussed with general surgery who felt this was self-limiting and no acute intervention needed at this time. -GI consulted, and following and recommended general surgery input.  - Repeat CT abdomen and pelvis done with resolution of intussusception.  - General Surgery curb sided who reviewed films and no further surgical needs or workup needed at this time.   - Patient's diet was advanced to a regular diet which she tolerated.  -Patient passing flatus and having bowel movements by day of discharge.   - Outpatient follow-up with PCP.   3.  Hypokalemia - Repleted during the hospitalization.    4.  Leukocytosis -Likely reactive leukocytosis. -Urinalysis unremarkable. -Patient afebrile. -Leukocytosis trended down and resolved.   5.  Dehydration - Patient hydrated with IV fluids.     Procedures: CT abdomen and pelvis 02/09/2024, 02/11/2024 Chest x-ray  02/09/2024 Upper endoscopy: 02/11/2024 by Dr. Saintclair  Consultations: Gastroenterology: Dr. Saintclair 02/11/2024 Curb sided General Surgery.  Discharge Exam: Vitals:   02/12/24 0430 02/12/24 1431  BP: (!) 108/54 103/81  Pulse: 60 87  Resp: 17 17  Temp: (!) 97.3 F (36.3 C) 98.4 F (36.9 C)  SpO2:      General: NAD Cardiovascular: RRR no murmurs rubs or gallops.  No JVD.  No lower extremity edema. Respiratory: CTAB.  No wheezes, no crackles, no rhonchi.  Discharge Instructions   Discharge Instructions     Diet general   Complete by: As directed    Discharge instructions   Complete by: As directed    Please avoid NSAIDs, aspirin, Goody powder.   Increase activity slowly   Complete by: As directed       Allergies as of 02/12/2024       Reactions   Ibuprofen  Other (See Comments)   Caused ulcers        Medication List     TAKE these medications    acetaminophen  500 MG tablet Commonly known as: TYLENOL  Take 2 tablets (1,000 mg total) by mouth every 6 (six) hours as needed. What changed:  when to take this reasons to take this   amphetamine -dextroamphetamine  30 MG tablet Commonly known as: ADDERALL Take 30 mg by mouth 2 (two) times daily.   pantoprazole  40 MG tablet Commonly known  as: Protonix  Take 1 tablet (40 mg total) by mouth 2 (two) times daily for 30 days, THEN 1 tablet (40 mg total) daily for 30 days, THEN 1 tablet (40 mg total) daily as needed. Start taking on: February 12, 2024 What changed: See the new instructions.   prochlorperazine  10 MG tablet Commonly known as: COMPAZINE  Take 1 tablet (10 mg total) by mouth every 6 (six) hours as needed for nausea or vomiting.   sucralfate  1 g tablet Commonly known as: CARAFATE  Take 1 tablet (1 g total) by mouth 4 (four) times daily -  with meals and at bedtime.   traZODone  50 MG tablet Commonly known as: DESYREL  Take 50 mg by mouth at bedtime.       Allergies[1]  Follow-up Information     Sun,  Vyvyan, MD. Schedule an appointment as soon as possible for a visit in 2 week(s).   Specialty: Family Medicine Contact information: [phone] [address] [place] [address] [phone]         Saintclair Jasper, MD Follow up.   Specialty: Gastroenterology Why: As needed Contact information: [address] [place] [address] [phone]                  The results of significant diagnostics from this hospitalization (including imaging, microbiology, ancillary and laboratory) are listed below for reference.    Significant Diagnostic Studies: CT ABDOMEN PELVIS W CONTRAST Result Date: 02/11/2024 CLINICAL DATA:  Generalized abdominal pain, history of small-bowel intussusception EXAM: CT ABDOMEN AND PELVIS WITH CONTRAST TECHNIQUE: Multidetector CT imaging of the abdomen and pelvis was performed using the standard protocol following bolus administration of intravenous contrast. RADIATION DOSE REDUCTION: This exam was performed according to the departmental dose-optimization program which includes automated exposure control, adjustment of the mA and/or kV according to patient size and/or use of iterative reconstruction technique. CONTRAST:  OMNIPAQUE  IOHEXOL  300 MG/ML  SOLN COMPARISON:  02/09/2024 FINDINGS: Lower chest: No acute pleural or parenchymal lung disease. Hepatobiliary: No focal liver abnormality is seen. No gallstones, gallbladder wall thickening, or biliary dilatation. Pancreas: Unremarkable. No pancreatic ductal dilatation or surrounding inflammatory changes. Spleen: Normal in size without focal abnormality. Adrenals/Urinary Tract: Adrenal glands are unremarkable. Kidneys are normal, without renal calculi, focal lesion, or hydronephrosis. Bladder is unremarkable. Stomach/Bowel: No bowel obstruction or ileus. Normal retrocecal appendix. No bowel wall thickening or inflammatory change. The small bowel-small bowel intussusception seen within the left  upper quadrant on prior study has resolved in the interim. Vascular/Lymphatic: No significant vascular findings are present. No enlarged abdominal or pelvic lymph nodes. Reproductive: Prostate is unremarkable. Other: No free fluid or free intraperitoneal gas. No abdominal wall hernia. Musculoskeletal: No acute or destructive bony abnormalities. Reconstructed images demonstrate no additional findings. IMPRESSION: 1. No acute intra-abdominal or intrapelvic process. 2. Interval resolution of the small bowel-small bowel intussusception seen on prior study. Electronically Signed   By: Ozell Daring M.D.   On: 02/11/2024 15:15   CT ABDOMEN PELVIS W CONTRAST Result Date: 02/09/2024 CLINICAL DATA:  History of gastric ulcers nausea vomiting EXAM: CT ABDOMEN AND PELVIS WITH CONTRAST TECHNIQUE: Multidetector CT imaging of the abdomen and pelvis was performed using the standard protocol following bolus administration of intravenous contrast. RADIATION DOSE REDUCTION: This exam was performed according to the departmental dose-optimization program which includes automated exposure control, adjustment of the mA and/or kV according to patient size and/or use of iterative reconstruction technique. CONTRAST:  OMNIPAQUE  IOHEXOL  300 MG/ML  SOLN COMPARISON:  CT 08/29/2023, 08/23/2023, 10/25/2017 FINDINGS: Lower chest: Lung bases demonstrate no acute airspace disease. Hepatobiliary: No focal liver abnormality is seen. No gallstones, gallbladder wall thickening, or biliary dilatation. Pancreas: Unremarkable. No pancreatic ductal dilatation or surrounding inflammatory changes. Spleen: Normal in size without focal abnormality. Adrenals/Urinary Tract: Adrenal glands are unremarkable. Kidneys are normal, without renal calculi, focal lesion, or hydronephrosis. Bladder is unremarkable. Stomach/Bowel: The stomach is within normal limits. Small bowel intussusception in the left upper quadrant, series 2, image 25 through 32, coronal  series 4 image 60. No obstruction. No acute bowel wall thickening. Negative appendix. Vascular/Lymphatic: No significant vascular findings are present. No enlarged abdominal or pelvic lymph nodes. Reproductive: Prostate is unremarkable. Other: No ascites or free air Musculoskeletal: No acute osseous abnormality IMPRESSION: 1. Small bowel intussusception in the left upper quadrant without evidence for obstruction or obvious mass. These are typically transient/self limiting 2. Otherwise no CT evidence for acute intra-abdominal or pelvic abnormality. Electronically Signed   By: Luke Bun M.D.   On: 02/09/2024 22:52   DG Chest 2 View Result Date: 02/09/2024 EXAM: 2 VIEW(S) XRAY OF THE CHEST 02/09/2024 10:39:00 PM COMPARISON: 09/18/2017 CLINICAL HISTORY: The patient presents with chest pain. FINDINGS: LUNGS AND PLEURA: No focal pulmonary opacity. No pleural effusion. No pneumothorax. HEART AND MEDIASTINUM: No acute abnormality of the cardiac and mediastinal silhouettes. BONES AND SOFT TISSUES: No acute osseous abnormality. IMPRESSION: 1. No acute cardiopulmonary pathology. Electronically signed by: Morgane Naveau MD 02/09/2024 10:48 PM EST RP Workstation: [id number]    Microbiology: No results found for this or any previous visit (from the past 240 hours).   Labs: Basic Metabolic Panel: Recent Labs  Lab 02/09/24 1242 02/10/24 1754 02/11/24 0613 02/12/24 0653  NA 140 139 138 138  K 3.1* 2.8* 3.1* 3.8  CL 94* 96* 99 102  CO2 29 31 30 28   GLUCOSE 110* 102* 87 86  BUN 24* 23* 19 13  CREATININE 1.02 0.97 0.94 0.80  CALCIUM 10.9* 9.8 9.5 9.0  MG  --  2.6*  --   --   PHOS  --   --   --  2.8   Liver Function Tests: Recent Labs  Lab 02/09/24 1242 02/12/24 0653  AST 21  --   ALT 26  --   ALKPHOS 81  --   BILITOT 1.4*  --   PROT 9.3*  --   ALBUMIN 5.3* 3.9   Recent Labs  Lab 02/09/24 1242  LIPASE 39   No results for input(s): AMMONIA in the last 168 hours. CBC: Recent Labs  Lab  02/09/24 1242 02/10/24 1754 02/11/24 0613 02/12/24 0653  WBC 13.7* 11.7* 9.7 8.8  NEUTROABS  --  7.1  --  5.4  HGB 19.2* 17.1* 16.0 14.2  HCT 54.0* 49.0 46.6 41.9  MCV 87.2 88.0 88.3 89.1  PLT 375 277 248 202   Cardiac Enzymes: No results for input(s): CKTOTAL, CKMB, CKMBINDEX, TROPONINI in the last 168 hours. BNP: BNP (last 3 results) No results for input(s): BNP in the last 8760 hours.  ProBNP (last 3 results) No results for input(s): PROBNP in the last 8760 hours.  CBG: No results for input(s): GLUCAP in the last 168 hours.     Signed:  Toribio Hummer MD.  [facility] 02/12/2024, 4:37 PM        [1]  Allergies Allergen Reactions   Ibuprofen  Other (See Comments)    Caused ulcers

## 2024-02-13 ENCOUNTER — Other Ambulatory Visit: Payer: Self-pay

## 2024-02-13 ENCOUNTER — Emergency Department (HOSPITAL_COMMUNITY)

## 2024-02-13 ENCOUNTER — Emergency Department (HOSPITAL_COMMUNITY)
Admission: EM | Admit: 2024-02-13 | Discharge: 2024-02-13 | Disposition: A | Discharge status: Home / Self Care | Attending: Emergency Medicine | Admitting: Emergency Medicine

## 2024-02-13 ENCOUNTER — Encounter (HOSPITAL_COMMUNITY): Payer: Self-pay | Admitting: Emergency Medicine

## 2024-02-13 DIAGNOSIS — R1084 Generalized abdominal pain: Secondary | ICD-10-CM | POA: Insufficient documentation

## 2024-02-13 DIAGNOSIS — R112 Nausea with vomiting, unspecified: Secondary | ICD-10-CM | POA: Insufficient documentation

## 2024-02-13 DIAGNOSIS — R109 Unspecified abdominal pain: Secondary | ICD-10-CM | POA: Diagnosis present

## 2024-02-13 LAB — COMPREHENSIVE METABOLIC PANEL WITH GFR
ALT: 34 U/L (ref 0–44)
AST: 21 U/L (ref 15–41)
Albumin: 4.5 g/dL (ref 3.5–5.0)
Alkaline Phosphatase: 72 U/L (ref 38–126)
Anion gap: 10 (ref 5–15)
BUN: 10 mg/dL (ref 6–20)
CO2: 25 mmol/L (ref 22–32)
Calcium: 9.5 mg/dL (ref 8.9–10.3)
Chloride: 104 mmol/L (ref 98–111)
Creatinine, Ser: 0.74 mg/dL (ref 0.61–1.24)
GFR, Estimated: >60 mL/min
Glucose, Bld: 109 mg/dL — ABNORMAL HIGH (ref 70–99)
Potassium: 3.7 mmol/L (ref 3.5–5.1)
Sodium: 139 mmol/L (ref 135–145)
Total Bilirubin: 0.9 mg/dL (ref 0.0–1.2)
Total Protein: 7.2 g/dL (ref 6.5–8.1)

## 2024-02-13 LAB — CBC WITH DIFFERENTIAL/PLATELET
Abs Immature Granulocytes: 0.06 K/uL (ref 0.00–0.07)
Basophils Absolute: 0 K/uL (ref 0.0–0.1)
Basophils Relative: 0 %
Eosinophils Absolute: 0 K/uL (ref 0.0–0.5)
Eosinophils Relative: 0 %
HCT: 44.6 % (ref 39.0–52.0)
Hemoglobin: 15.4 g/dL (ref 13.0–17.0)
Immature Granulocytes: 1 %
Lymphocytes Relative: 16 %
Lymphs Abs: 1.8 K/uL (ref 0.7–4.0)
MCH: 30.7 pg (ref 26.0–34.0)
MCHC: 34.5 g/dL (ref 30.0–36.0)
MCV: 88.8 fL (ref 80.0–100.0)
Monocytes Absolute: 0.6 K/uL (ref 0.1–1.0)
Monocytes Relative: 5 %
Neutro Abs: 8.6 K/uL — ABNORMAL HIGH (ref 1.7–7.7)
Neutrophils Relative %: 78 %
Platelets: 240 K/uL (ref 150–400)
RBC: 5.02 MIL/uL (ref 4.22–5.81)
RDW: 12.4 % (ref 11.5–15.5)
WBC: 11.1 K/uL — ABNORMAL HIGH (ref 4.0–10.5)
nRBC: 0 % (ref 0.0–0.2)

## 2024-02-13 LAB — LIPASE, BLOOD: Lipase: 41 U/L (ref 11–51)

## 2024-02-13 MED ORDER — DROPERIDOL 2.5 MG/ML IJ SOLN
1.2500 mg | Freq: Once | INTRAMUSCULAR | Status: AC
  Administered 2024-02-13: 1.25 mg via INTRAVENOUS
  Filled 2024-02-13: qty 2

## 2024-02-13 MED ORDER — MORPHINE SULFATE (PF) 4 MG/ML IV SOLN
4.0000 mg | Freq: Once | INTRAVENOUS | Status: AC
  Administered 2024-02-13: 4 mg via INTRAVENOUS
  Filled 2024-02-13: qty 1

## 2024-02-13 MED ORDER — IOHEXOL 300 MG/ML  SOLN
100.0000 mL | Freq: Once | INTRAMUSCULAR | Status: AC | PRN
  Administered 2024-02-13: 100 mL via INTRAVENOUS

## 2024-02-13 NOTE — ED Triage Notes (Signed)
" °  Patient comes in with abdominal and emesis that started last night around 2100.  Patient was discharged yesterday afternoon after EGD and intussusception procedure.  Patient states he felt better at discharge but started having severe abdominal pain and emesis around dinner time.  Estimates about 20 emesis episodes.  Pain 7/10, stabbing/constant.    "

## 2024-02-13 NOTE — ED Notes (Signed)
 Emergency department nurse tech witnessed pt walk out of emergency department without notifying provider or nurse. Nurse tech tried to direct pt back to room with no success. Pt had IV placed in right wrist. Pt provider notified. Pt RN attempting to contact pt for appropriate IV removal.

## 2024-02-13 NOTE — ED Notes (Signed)
 Pt came out of restroom and started walking towards the front door. I asked pt where he was going. Pt stated he was leaving. Pt walked out of side doors by room 9 while this writer attempted to tsop pt to check for IV and direct pt the correct way out of department. Pt kept walking and ignored this clinical research associate.

## 2024-02-13 NOTE — ED Provider Notes (Signed)
 " [place] [facility] EMERGENCY DEPARTMENT AT [facility] Provider Note   CSN: [id number] Arrival date & time: 02/13/24  0344     Patient presents with: Abdominal Pain and Emesis   [name] is a 34 y.o. male.  Patient recently admitted for EGD due to coffee-ground emesis with incidental intussusception noted on CT scan, discharged yesterday afternoon presents to the emergency room complaining of repeat abdominal pain and approximately 20 episodes of emesis.  He reports 7 out of 10 pain which he describes as stabbing.    Abdominal Pain Associated symptoms: vomiting   Emesis Associated symptoms: abdominal pain        Prior to Admission medications  Medication Sig Start Date End Date Taking? Authorizing Provider  acetaminophen  (TYLENOL ) 500 MG tablet Take 2 tablets (1,000 mg total) by mouth every 6 (six) hours as needed. 02/12/24   Sebastian Toribio GAILS, MD  amphetamine -dextroamphetamine  (ADDERALL) 30 MG tablet Take 30 mg by mouth 2 (two) times daily. 07/20/23   [provider]  pantoprazole  (PROTONIX ) 40 MG tablet Take 1 tablet (40 mg total) by mouth 2 (two) times daily for 30 days, THEN 1 tablet (40 mg total) daily for 30 days, THEN 1 tablet (40 mg total) daily as needed. 02/12/24 07/11/24  Sebastian Toribio GAILS, MD  prochlorperazine  (COMPAZINE ) 10 MG tablet Take 1 tablet (10 mg total) by mouth every 6 (six) hours as needed for nausea or vomiting. 02/12/24   Sebastian Toribio GAILS, MD  sucralfate  (CARAFATE ) 1 g tablet Take 1 tablet (1 g total) by mouth 4 (four) times daily -  with meals and at bedtime. 02/12/24 02/26/24  Sebastian Toribio GAILS, MD  traZODone  (DESYREL ) 50 MG tablet Take 50 mg by mouth at bedtime.    [provider]    Allergies: Ibuprofen     Review of Systems  Gastrointestinal:  Positive for abdominal pain and vomiting.    Updated Vital Signs BP 129/82 (BP Location: Left Arm)   Pulse 91   Temp 97.9 F (36.6 C) (Oral)   Resp 20   SpO2 95%    Physical Exam Vitals and nursing note reviewed.  HENT:     Head: Normocephalic and atraumatic.  Eyes:     Pupils: Pupils are equal, round, and reactive to light.  Cardiovascular:     Rate and Rhythm: Normal rate and regular rhythm.  Pulmonary:     Effort: Pulmonary effort is normal. No respiratory distress.     Breath sounds: Normal breath sounds.  Abdominal:     Palpations: Abdomen is soft.     Tenderness: There is generalized abdominal tenderness.  Musculoskeletal:        General: No signs of injury.     Cervical back: Normal range of motion.  Skin:    General: Skin is dry.  Neurological:     Mental Status: He is alert.  Psychiatric:        Speech: Speech normal.        Behavior: Behavior normal.     (all labs ordered are listed, but only abnormal results are displayed) Labs Reviewed  CBC WITH DIFFERENTIAL/PLATELET  COMPREHENSIVE METABOLIC PANEL WITH GFR  LIPASE, BLOOD  URINALYSIS, ROUTINE W REFLEX MICROSCOPIC    EKG: None  Radiology: CT ABDOMEN PELVIS W CONTRAST Result Date: 02/11/2024 CLINICAL DATA:  Generalized abdominal pain, history of small-bowel intussusception EXAM: CT ABDOMEN AND PELVIS WITH CONTRAST TECHNIQUE: Multidetector CT imaging of the abdomen and pelvis was performed using the standard protocol following bolus  administration of intravenous contrast. RADIATION DOSE REDUCTION: This exam was performed according to the departmental dose-optimization program which includes automated exposure control, adjustment of the mA and/or kV according to patient size and/or use of iterative reconstruction technique. CONTRAST:  OMNIPAQUE  IOHEXOL  300 MG/ML  SOLN COMPARISON:  02/09/2024 FINDINGS: Lower chest: No acute pleural or parenchymal lung disease. Hepatobiliary: No focal liver abnormality is seen. No gallstones, gallbladder wall thickening, or biliary dilatation. Pancreas: Unremarkable. No pancreatic ductal dilatation or surrounding inflammatory changes.  Spleen: Normal in size without focal abnormality. Adrenals/Urinary Tract: Adrenal glands are unremarkable. Kidneys are normal, without renal calculi, focal lesion, or hydronephrosis. Bladder is unremarkable. Stomach/Bowel: No bowel obstruction or ileus. Normal retrocecal appendix. No bowel wall thickening or inflammatory change. The small bowel-small bowel intussusception seen within the left upper quadrant on prior study has resolved in the interim. Vascular/Lymphatic: No significant vascular findings are present. No enlarged abdominal or pelvic lymph nodes. Reproductive: Prostate is unremarkable. Other: No free fluid or free intraperitoneal gas. No abdominal wall hernia. Musculoskeletal: No acute or destructive bony abnormalities. Reconstructed images demonstrate no additional findings. IMPRESSION: 1. No acute intra-abdominal or intrapelvic process. 2. Interval resolution of the small bowel-small bowel intussusception seen on prior study. Electronically Signed   By: Ozell Daring M.D.   On: 02/11/2024 15:15     Procedures   Medications Ordered in the ED  morphine  (PF) 4 MG/ML injection 4 mg (4 mg Intravenous Given 02/13/24 0423)  droperidol  (INAPSINE ) 2.5 MG/ML injection 1.25 mg (1.25 mg Intravenous Given 02/13/24 0422)  iohexol  (OMNIPAQUE ) 300 MG/ML solution 100 mL (100 mLs Intravenous Contrast Given 02/13/24 0434)                                    Medical Decision Making Amount and/or Complexity of Data Reviewed Labs: ordered. Radiology: ordered.  Risk Prescription drug management.   This patient presents to the ED for concern of abdominal pain with nausea and vomiting, this involves an extensive number of treatment options, and is a complaint that carries with it a high risk of complications and morbidity.  The differential diagnosis includes postop complication, intussusception, cholecystitis, appendicitis, perforated bowel, cannabinoid hyperemesis syndrome, others   Co morbidities /  Chronic conditions that complicate the patient evaluation  Duodenal ulcer, peptic ulcer disease, intussusception of small intestine   Additional history obtained:  Additional history obtained from EMR External records from outside source obtained and reviewed including recent discharge summary   Lab Tests:  I Ordered, and personally interpreted labs.  The pertinent results include: white count of 11,100   Imaging Studies ordered:  I ordered imaging studies including CT abdomen pelvis with contrast I independently visualized and interpreted imaging which showed no acute findings I agree with the radiologist interpretation   Problem List / ED Course / Critical interventions / Medication management   I ordered medication including morphine , droperidol  Patient eloped prior to reassessment    Social Determinants of Health:  Patient has Medicaid for his primary health insurance type   Test / Admission - Considered:  Patient with abdominal pain, nausea, vomiting.  Differential as noted above with highest concerns at this time being postop complication, recurrent intussusception, and cannabinoid hyperemesis syndrome.  Chart review showed that GI stated that the patient's nausea and vomiting was likely related to cannabinoid hyperemesis.  After receiving medications getting a CT scan the patient was seen walking out of the  department with IV still in place.  He eloped prior to any further assessment or treatment.      Final diagnoses:  None    ED Discharge Orders     None          Logan Ubaldo KATHEE DEVONNA 02/13/24 0554    Theadore Ozell HERO, MD 02/13/24 [phone]  "

## 2024-02-14 ENCOUNTER — Encounter (HOSPITAL_COMMUNITY): Payer: Self-pay | Admitting: Gastroenterology

## 2024-02-15 LAB — SURGICAL PATHOLOGY

## 2024-02-16 ENCOUNTER — Encounter (HOSPITAL_BASED_OUTPATIENT_CLINIC_OR_DEPARTMENT_OTHER): Payer: Self-pay

## 2024-02-16 ENCOUNTER — Other Ambulatory Visit: Payer: Self-pay

## 2024-02-16 ENCOUNTER — Emergency Department (HOSPITAL_BASED_OUTPATIENT_CLINIC_OR_DEPARTMENT_OTHER)
Admission: EM | Admit: 2024-02-16 | Discharge: 2024-02-17 | Disposition: A | Discharge status: Home / Self Care | Attending: Emergency Medicine | Admitting: Emergency Medicine

## 2024-02-16 ENCOUNTER — Emergency Department (HOSPITAL_BASED_OUTPATIENT_CLINIC_OR_DEPARTMENT_OTHER): Admitting: Radiology

## 2024-02-16 DIAGNOSIS — R079 Chest pain, unspecified: Secondary | ICD-10-CM | POA: Diagnosis not present

## 2024-02-16 DIAGNOSIS — K2970 Gastritis, unspecified, without bleeding: Secondary | ICD-10-CM

## 2024-02-16 DIAGNOSIS — R1084 Generalized abdominal pain: Secondary | ICD-10-CM | POA: Insufficient documentation

## 2024-02-16 DIAGNOSIS — R112 Nausea with vomiting, unspecified: Secondary | ICD-10-CM | POA: Diagnosis not present

## 2024-02-16 DIAGNOSIS — D72829 Elevated white blood cell count, unspecified: Secondary | ICD-10-CM | POA: Diagnosis not present

## 2024-02-16 DIAGNOSIS — Z79899 Other long term (current) drug therapy: Secondary | ICD-10-CM | POA: Diagnosis not present

## 2024-02-16 DIAGNOSIS — R0602 Shortness of breath: Secondary | ICD-10-CM | POA: Diagnosis not present

## 2024-02-16 LAB — CBC
HCT: 44.9 % (ref 39.0–52.0)
Hemoglobin: 16.1 g/dL (ref 13.0–17.0)
MCH: 30.6 pg (ref 26.0–34.0)
MCHC: 35.9 g/dL (ref 30.0–36.0)
MCV: 85.4 fL (ref 80.0–100.0)
Platelets: 317 K/uL (ref 150–400)
RBC: 5.26 MIL/uL (ref 4.22–5.81)
RDW: 12.6 % (ref 11.5–15.5)
WBC: 13.1 K/uL — ABNORMAL HIGH (ref 4.0–10.5)
nRBC: 0 % (ref 0.0–0.2)

## 2024-02-16 LAB — BASIC METABOLIC PANEL WITH GFR
Anion gap: 18 — ABNORMAL HIGH (ref 5–15)
BUN: 8 mg/dL (ref 6–20)
CO2: 23 mmol/L (ref 22–32)
Calcium: 10.4 mg/dL — ABNORMAL HIGH (ref 8.9–10.3)
Chloride: 98 mmol/L (ref 98–111)
Creatinine, Ser: 0.96 mg/dL (ref 0.61–1.24)
GFR, Estimated: >60 mL/min
Glucose, Bld: 141 mg/dL — ABNORMAL HIGH (ref 70–99)
Potassium: 3.1 mmol/L — ABNORMAL LOW (ref 3.5–5.1)
Sodium: 139 mmol/L (ref 135–145)

## 2024-02-16 LAB — TROPONIN T, HIGH SENSITIVITY: Troponin T High Sensitivity: 7 ng/L (ref 0–19)

## 2024-02-16 LAB — LIPASE, BLOOD: Lipase: 24 U/L (ref 11–51)

## 2024-02-16 MED ORDER — ONDANSETRON HCL 4 MG/2ML IJ SOLN
4.0000 mg | Freq: Once | INTRAMUSCULAR | Status: AC
  Administered 2024-02-16: 4 mg via INTRAVENOUS
  Filled 2024-02-16: qty 2

## 2024-02-16 MED ORDER — SODIUM CHLORIDE 0.9 % IV BOLUS
1000.0000 mL | Freq: Once | INTRAVENOUS | Status: AC
  Administered 2024-02-16: 1000 mL via INTRAVENOUS

## 2024-02-16 MED ORDER — MORPHINE SULFATE (PF) 4 MG/ML IV SOLN
4.0000 mg | Freq: Once | INTRAVENOUS | Status: AC
  Administered 2024-02-16: 4 mg via INTRAVENOUS
  Filled 2024-02-16: qty 1

## 2024-02-16 NOTE — ED Triage Notes (Signed)
 Pt reports chest pain and stomach pain over the last two weeks. Pt reports recent admission for stomach ulcers. Pt reports he has been vomiting today and has had increased chest pain and SHOB. Pt tachycardic in triage.

## 2024-02-16 NOTE — ED Provider Notes (Signed)
 " [facility] AT [facility] Provider Note   CSN: [id number] Arrival date & time: 02/16/24  2144     Patient presents with: Shortness of Breath, Chest Pain, and Emesis   [name] is a 34 y.o. male.  {Add pertinent medical, surgical, social history, OB history to YEP:67052} Patient is a 34 year old male presenting with complaints of abdominal pain, nausea, and vomiting.  He has been having issues for the past several weeks and was told he has stomach ulcers.  He was admitted and underwent endoscopy last week showing gastritis/ulcers.  He returns stating that his pain and vomiting has returned.  No fevers or chills.  No diarrhea or constipation.       Prior to Admission medications  Medication Sig Start Date End Date Taking? Authorizing Provider  acetaminophen  (TYLENOL ) 500 MG tablet Take 2 tablets (1,000 mg total) by mouth every 6 (six) hours as needed. 02/12/24   Sebastian Toribio GAILS, MD  amphetamine -dextroamphetamine  (ADDERALL) 30 MG tablet Take 30 mg by mouth 2 (two) times daily. 07/20/23   [provider]  pantoprazole  (PROTONIX ) 40 MG tablet Take 1 tablet (40 mg total) by mouth 2 (two) times daily for 30 days, THEN 1 tablet (40 mg total) daily for 30 days, THEN 1 tablet (40 mg total) daily as needed. 02/12/24 07/11/24  Sebastian Toribio GAILS, MD  prochlorperazine  (COMPAZINE ) 10 MG tablet Take 1 tablet (10 mg total) by mouth every 6 (six) hours as needed for nausea or vomiting. 02/12/24   Sebastian Toribio GAILS, MD  sucralfate  (CARAFATE ) 1 g tablet Take 1 tablet (1 g total) by mouth 4 (four) times daily -  with meals and at bedtime. 02/12/24 02/26/24  Sebastian Toribio GAILS, MD  traZODone  (DESYREL ) 50 MG tablet Take 50 mg by mouth at bedtime.    [provider]    Allergies: Ibuprofen     Review of Systems  All other systems reviewed and are negative.   Updated Vital Signs BP 122/88   Pulse (!) 138   Temp 98 F (36.7 C)   Resp 18   SpO2  100%   Physical Exam Vitals and nursing note reviewed.  Constitutional:      General: He is not in acute distress.    Appearance: He is well-developed. He is not diaphoretic.  HENT:     Head: Normocephalic and atraumatic.  Cardiovascular:     Rate and Rhythm: Normal rate and regular rhythm.     Heart sounds: No murmur heard.    No friction rub.  Pulmonary:     Effort: Pulmonary effort is normal. No respiratory distress.     Breath sounds: Normal breath sounds. No wheezing or rales.  Abdominal:     General: Bowel sounds are normal. There is no distension.     Palpations: Abdomen is soft.     Tenderness: There is abdominal tenderness. There is no guarding or rebound.     Comments: There is generalized abdominal tenderness.  No rebound or guarding.  Musculoskeletal:        General: Normal range of motion.     Cervical back: Normal range of motion and neck supple.  Skin:    General: Skin is warm and dry.  Neurological:     Mental Status: He is alert and oriented to person, place, and time.     Coordination: Coordination normal.     (all labs ordered are listed, but only abnormal results are displayed) Labs Reviewed  BASIC METABOLIC  PANEL WITH GFR - Abnormal; Notable for the following components:      Result Value   Potassium 3.1 (*)    Glucose, Bld 141 (*)    Calcium 10.4 (*)    Anion gap 18 (*)    All other components within normal limits  CBC - Abnormal; Notable for the following components:   WBC 13.1 (*)    All other components within normal limits  LIPASE, BLOOD  URINALYSIS, ROUTINE W REFLEX MICROSCOPIC  TROPONIN T, HIGH SENSITIVITY    EKG: None  Radiology: DG Chest 2 View Result Date: 02/16/2024 EXAM: 2 VIEW(S) XRAY OF THE CHEST 02/16/2024 10:16:00 PM COMPARISON: 02/09/2024 CLINICAL HISTORY: Chest pain. FINDINGS: LUNGS AND PLEURA: No focal pulmonary opacity. No pleural effusion. No pneumothorax. HEART AND MEDIASTINUM: No acute abnormality of the cardiac and  mediastinal silhouettes. BONES AND SOFT TISSUES: No acute osseous abnormality. IMPRESSION: 1. No acute cardiopulmonary pathology. Electronically signed by: Franky Crease MD 02/16/2024 10:20 PM EST RP Workstation: [id number]    {Document cardiac monitor, telemetry assessment procedure when appropriate:32947} Procedures   Medications Ordered in the ED  ondansetron  (ZOFRAN ) injection 4 mg (has no administration in time range)  sodium chloride  0.9 % bolus 1,000 mL (has no administration in time range)  morphine  (PF) 4 MG/ML injection 4 mg (has no administration in time range)  ondansetron  (ZOFRAN ) injection 4 mg (4 mg Intravenous Given 02/16/24 2248)      {Click here for ABCD2, HEART and other calculators REFRESH Note before signing:1}                              Medical Decision Making Amount and/or Complexity of Data Reviewed Labs: ordered. Radiology: ordered.  Risk Prescription drug management.   ***  {Document critical care time when appropriate  Document review of labs and clinical decision tools ie CHADS2VASC2, etc  Document your independent review of radiology images and any outside records  Document your discussion with family members, caretakers and with consultants  Document social determinants of health affecting pt's care  Document your decision making why or why not admission, treatments were needed:32947:::1}   Final diagnoses:  None    ED Discharge Orders     None        "

## 2024-02-17 LAB — TROPONIN T, HIGH SENSITIVITY: Troponin T High Sensitivity: 8 ng/L (ref 0–19)

## 2024-02-17 MED ORDER — HYDROMORPHONE HCL 1 MG/ML IJ SOLN
1.0000 mg | Freq: Once | INTRAMUSCULAR | Status: AC
  Administered 2024-02-17: 1 mg via INTRAVENOUS
  Filled 2024-02-17: qty 1

## 2024-02-17 NOTE — ED Notes (Addendum)
 SABRA

## 2024-02-17 NOTE — Discharge Instructions (Signed)
 Follow-up with your gastroenterologist in the morning.
